# Patient Record
Sex: Female | Born: 1940
Health system: Southern US, Community
[De-identification: ages and names within clinical notes are randomized; demographics above are authoritative.]

## PROBLEM LIST (undated history)

## (undated) DIAGNOSIS — L659 Nonscarring hair loss, unspecified: Secondary | ICD-10-CM

## (undated) DIAGNOSIS — K900 Celiac disease: Secondary | ICD-10-CM

## (undated) DIAGNOSIS — K5792 Diverticulitis of intestine, part unspecified, without perforation or abscess without bleeding: Secondary | ICD-10-CM

## (undated) DIAGNOSIS — M549 Dorsalgia, unspecified: Secondary | ICD-10-CM

## (undated) DIAGNOSIS — E0590 Thyrotoxicosis, unspecified without thyrotoxic crisis or storm: Secondary | ICD-10-CM

## (undated) DIAGNOSIS — M1990 Unspecified osteoarthritis, unspecified site: Secondary | ICD-10-CM

## (undated) DIAGNOSIS — E079 Disorder of thyroid, unspecified: Secondary | ICD-10-CM

## (undated) DIAGNOSIS — I10 Essential (primary) hypertension: Secondary | ICD-10-CM

## (undated) DIAGNOSIS — K219 Gastro-esophageal reflux disease without esophagitis: Secondary | ICD-10-CM

## (undated) DIAGNOSIS — R0602 Shortness of breath: Secondary | ICD-10-CM

## (undated) DIAGNOSIS — F1010 Alcohol abuse, uncomplicated: Secondary | ICD-10-CM

## (undated) DIAGNOSIS — T7840XA Allergy, unspecified, initial encounter: Secondary | ICD-10-CM

## (undated) DIAGNOSIS — R600 Localized edema: Secondary | ICD-10-CM

## (undated) DIAGNOSIS — K589 Irritable bowel syndrome without diarrhea: Secondary | ICD-10-CM

## (undated) DIAGNOSIS — M2550 Pain in unspecified joint: Secondary | ICD-10-CM

## (undated) HISTORY — DX: Gastro-esophageal reflux disease without esophagitis: K21.9

## (undated) HISTORY — DX: Localized edema: R60.0

## (undated) HISTORY — DX: Alcohol abuse, uncomplicated: F10.10

## (undated) HISTORY — DX: Nonscarring hair loss, unspecified: L65.9

## (undated) HISTORY — DX: Thyrotoxicosis, unspecified without thyrotoxic crisis or storm: E05.90

## (undated) HISTORY — DX: Essential (primary) hypertension: I10

## (undated) HISTORY — PX: COLONOSCOPY: SHX174

## (undated) HISTORY — DX: Celiac disease: K90.0

## (undated) HISTORY — DX: Allergy, unspecified, initial encounter: T78.40XA

## (undated) HISTORY — PX: CERVICAL CONE BIOPSY: SUR198

## (undated) HISTORY — DX: Unspecified osteoarthritis, unspecified site: M19.90

## (undated) HISTORY — DX: Diverticulitis of intestine, part unspecified, without perforation or abscess without bleeding: K57.92

## (undated) HISTORY — DX: Irritable bowel syndrome without diarrhea: K58.9

## (undated) HISTORY — DX: Disorder of thyroid, unspecified: E07.9

## (undated) HISTORY — PX: ESOPHAGOGASTRODUODENOSCOPY: SHX1529

---
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
## 2001-04-18 HISTORY — PX: REPLACEMENT TOTAL KNEE BILATERAL: SUR1225

## 2017-12-15 ENCOUNTER — Telehealth: Payer: Self-pay

## 2017-12-15 NOTE — Telephone Encounter (Signed)
Copied from [place] [id number]. Topic: Appointment Scheduling - New Patient >> Dec 15, 2017 11:26 AM Judyann Munson wrote: New patient has been scheduled for your office. Provider: Dr.Wendling Date of Appointment: 01-24-18  Route to department's [facility] pool.

## 2018-01-24 ENCOUNTER — Ambulatory Visit (INDEPENDENT_AMBULATORY_CARE_PROVIDER_SITE_OTHER): Payer: PPO | Admitting: Family Medicine

## 2018-01-24 ENCOUNTER — Encounter: Payer: Self-pay | Admitting: Family Medicine

## 2018-01-24 VITALS — BP 122/82 | HR 90 | Temp 97.7°F | Ht 64.0 in | Wt 184.4 lb

## 2018-01-24 DIAGNOSIS — I10 Essential (primary) hypertension: Secondary | ICD-10-CM | POA: Insufficient documentation

## 2018-01-24 DIAGNOSIS — E039 Hypothyroidism, unspecified: Secondary | ICD-10-CM

## 2018-01-24 DIAGNOSIS — Z23 Encounter for immunization: Secondary | ICD-10-CM

## 2018-01-24 HISTORY — DX: Hypothyroidism, unspecified: E03.9

## 2018-01-24 HISTORY — DX: Essential (primary) hypertension: I10

## 2018-01-24 MED ORDER — ESTRADIOL 0.1 MG/GM VA CREA: 1.0000 | TOPICAL_CREAM | Freq: Every day | VAGINAL | 1 refills | Status: DC

## 2018-01-24 MED ORDER — IPRATROPIUM BROMIDE 0.06 % NA SOLN: 2.0000 | NASAL | 3 refills | Status: DC | PRN

## 2018-01-24 NOTE — Progress Notes (Signed)
Chief Complaint  Patient presents with  . New Patient (Initial Visit)       New Patient Visit SUBJECTIVE: HPI: Shannon Bond is an 77 y.o.female who is being seen for establishing care.  The patient was previously seen in [place].  Hypertension Patient presents for hypertension follow up. She does monitor home blood pressures. Blood pressures ranging on average from 110's/70's. She is compliant with medication lisinopril 30 mg/d. Patient has these side effects of medication: none She is adhering to a healthy diet overall. Exercise: goes to [employer], working with trainer, yoga  Hypothyroidism Patient presents for follow-up of hypothyroidism.  Reports compliance with medication. Current symptoms include: denies fatigue, weight changes, heat/cold intolerance, bowel/skin changes or CVS symptoms She believes her dose should be unchanged    Allergies  Allergen Reactions  . Latex   . Penicillins     Past Medical History:  Diagnosis Date  . Alcohol abuse   . Allergy   . Arthritis   . GERD (gastroesophageal reflux disease)   . Hypertension   . Thyroid disease    Past Surgical History:  Procedure Laterality Date  . APPENDECTOMY  1954  . [place]  . REPLACEMENT TOTAL KNEE BILATERAL  2003   Family History  Problem Relation Age of Onset  . Hypertension Mother   . Early death Mother        died at 1 in a fire  . Alcohol abuse Father   . Cancer Father   . Early death Father        died at 35  . Diabetes Son   . Cancer Maternal Grandmother        breast cancer  . Mental illness Maternal Grandfather    Allergies  Allergen Reactions  . Latex   . Penicillins     Current Outpatient Medications:  .  estradiol (ESTRACE) 0.1 MG/GM vaginal cream, Place 1 Applicatorful vaginally at bedtime., Disp: 42.5 g, Rfl: 1 .  ipratropium (ATROVENT) 0.06 % nasal spray, Place 2 sprays into both nostrils as needed for rhinitis., Disp: 45 mL,  Rfl: 3 .  levothyroxine (SYNTHROID, LEVOTHROID) 75 MCG tablet, Take 75 mcg by mouth daily before breakfast., Disp: , Rfl:  .  lisinopril (PRINIVIL,ZESTRIL) 30 MG tablet, Take 30 mg by mouth daily., Disp: , Rfl:  .  loperamide (IMODIUM) 2 MG capsule, Take by mouth as needed for diarrhea or loose stools., Disp: , Rfl:   ROS Cardiovascular: Denies chest pain  Respiratory: Denies dyspnea   OBJECTIVE: BP 122/82 (BP Location: Left Arm, Patient Position: Sitting, Cuff Size: Normal)   Pulse 90   Temp 97.7 F (36.5 C) (Oral)   Ht 5' 4"  (1.626 m)   Wt 184 lb 6 oz (83.6 kg)   SpO2 97%   BMI 31.65 kg/m   Constitutional: -  VS reviewed -  Well developed, well nourished, appears stated age -  No apparent distress  Psychiatric: -  Oriented to person, place, and time -  Memory intact -  Affect and mood normal -  Fluent conversation, good eye contact -  Judgment and insight age appropriate  Eye: -  Conjunctivae clear, no discharge -  Pupils symmetric, round, reactive to light  ENMT: -  MMM    Pharynx moist, no exudate, no erythema  Neck: -  No gross swelling, no palpable masses -  Thyroid midline, not enlarged, mobile, no palpable masses  Cardiovascular: -  RRR -  No  bruits -  No LE edema  Respiratory: -  Normal respiratory effort, no accessory muscle use, no retraction -  Breath sounds equal, no wheezes, no ronchi, no crackles  Musculoskeletal: -  No clubbing, no cyanosis -  Gait normal  Skin: -  No significant lesion on inspection -  Warm and dry to palpation   ASSESSMENT/PLAN: Essential hypertension  Hypothyroidism, unspecified type  Need for influenza vaccination - Plan: Flu vaccine HIGH DOSE PF (Fluzone High dose)  Patient instructed to sign release of records form from her previous PCP. Cont ACEi, OK to stop checking at home. Continue thyroid medication, will check at her physical in 6 months. Patient should return in 6 mo or prn. The patient voiced understanding and  agreement to the plan.   [place], DO 01/24/18  10:34 AM

## 2018-01-24 NOTE — Patient Instructions (Addendum)
Pepcid (famotidine) is a good alternative to Prevacid and/or Zantac.  Because your blood pressure is well-controlled, you no longer have to check your blood pressure at home anymore unless you wish. Some people check it twice daily every day and some people stop altogether. Either or anything in between is fine. Strong work!  Stay active.  Keep the diet clean.  Let us know if you need anything.

## 2018-01-24 NOTE — Progress Notes (Signed)
Pre visit review using our clinic review tool, if applicable. No additional management support is needed unless otherwise documented below in the visit note. 

## 2018-02-19 ENCOUNTER — Telehealth: Payer: Self-pay | Admitting: *Deleted

## 2018-02-19 NOTE — Telephone Encounter (Signed)
Received Medical records from [facility]; forwarded to provider/SLS 11/04

## 2018-02-21 ENCOUNTER — Telehealth: Payer: Self-pay | Admitting: Family Medicine

## 2018-02-21 NOTE — Telephone Encounter (Signed)
Imms updated in chart. Due for Shingrix, both series, DEXA, colonoscopy.

## 2018-03-06 ENCOUNTER — Other Ambulatory Visit: Payer: Self-pay | Admitting: Family Medicine

## 2018-03-06 NOTE — Telephone Encounter (Signed)
Copied from [place] [id number]. Topic: Quick Communication - Rx Refill/Question >> Mar 06, 2018  2:50 PM Margot Ables wrote: Medication: levothyroxine (SYNTHROID, LEVOTHROID) 75 MCG tablet - 90 day supply please - 1 wk on hand lisinopril (PRINIVIL,ZESTRIL) 30 MG tablet - 90 day supply please - 1 week on hand  Has the patient contacted their pharmacy? No - new to practice Preferred Pharmacy (with phone number or street name): [id number] [place], [address] [phone](Phone) [phone](Fax)

## 2018-03-07 NOTE — Telephone Encounter (Signed)
Patient requesting 90 supply if possible.

## 2018-03-08 MED ORDER — LISINOPRIL 30 MG PO TABS: 30.0000 mg | ORAL_TABLET | Freq: Every day | ORAL | 3 refills | Status: DC

## 2018-03-08 MED ORDER — LEVOTHYROXINE SODIUM 75 MCG PO TABS: 75.0000 ug | ORAL_TABLET | Freq: Every day | ORAL | 3 refills | Status: DC

## 2018-03-08 NOTE — Telephone Encounter (Signed)
Requested medication (s) are due for refill today: yes  Requested medication (s) are on the active medication list: yes    Last refill: 01/24/18  Future visit scheduled yes 07/25/2018  Notes to clinic:historical provider  Requested Prescriptions  Pending Prescriptions Disp Refills   lisinopril (PRINIVIL,ZESTRIL) 30 MG tablet      Sig: Take 1 tablet (30 mg total) by mouth daily.     Cardiovascular:  ACE Inhibitors Failed - 03/07/2018  3:55 PM      Failed - Cr in normal range and within 180 days    No results found for: CREATININE       Failed - K in normal range and within 180 days    No results found for: K, POTASSIUM       Passed - Patient is not pregnant      Passed - Last BP in normal range    BP Readings from Last 1 Encounters:  01/24/18 122/82         Passed - Valid encounter within last 6 months    Recent Outpatient Visits          1 month ago Essential hypertension   [facility]ivist at [facility], [place], [place]      Future Appointments            In 4 months [place], [place], [place] at [facility], [facility]          levothyroxine (SYNTHROID, LEVOTHROID) 75 MCG tablet      Sig: Take 1 tablet (75 mcg total) by mouth daily before breakfast.     Endocrinology:  Hypothyroid Agents Failed - 03/07/2018  3:55 PM      Failed - TSH needs to be rechecked within 3 months after an abnormal result. Refill until TSH is due.      Failed - TSH in normal range and within 360 days    No results found for: TSH       Passed - Valid encounter within last 12 months    Recent Outpatient Visits          1 month ago Essential hypertension   Archivist at [facility], [place], [place]      Future Appointments            In 4 months [place], [place], [place] at [facility], [facility]

## 2018-04-02 ENCOUNTER — Encounter: Payer: Self-pay | Admitting: Family Medicine

## 2018-04-03 ENCOUNTER — Other Ambulatory Visit: Payer: Self-pay | Admitting: Family Medicine

## 2018-04-03 ENCOUNTER — Ambulatory Visit (INDEPENDENT_AMBULATORY_CARE_PROVIDER_SITE_OTHER): Payer: PPO | Admitting: Family Medicine

## 2018-04-03 ENCOUNTER — Encounter: Payer: Self-pay | Admitting: Family Medicine

## 2018-04-03 VITALS — BP 120/70 | HR 96 | Temp 97.5°F | Ht 64.0 in | Wt 179.1 lb

## 2018-04-03 DIAGNOSIS — R42 Dizziness and giddiness: Secondary | ICD-10-CM | POA: Diagnosis not present

## 2018-04-03 DIAGNOSIS — E875 Hyperkalemia: Secondary | ICD-10-CM

## 2018-04-03 LAB — COMPREHENSIVE METABOLIC PANEL
ALT: 17 U/L (ref 0–35)
AST: 21 U/L (ref 0–37)
Albumin: 4.1 g/dL (ref 3.5–5.2)
Alkaline Phosphatase: 72 U/L (ref 39–117)
BUN: 25 mg/dL — ABNORMAL HIGH (ref 6–23)
CO2: 24 mEq/L (ref 19–32)
Calcium: 9.6 mg/dL (ref 8.4–10.5)
Chloride: 105 mEq/L (ref 96–112)
Creatinine, Ser: 0.95 mg/dL (ref 0.40–1.20)
GFR: 60.69 mL/min (ref >60.00)
Glucose, Bld: 94 mg/dL (ref 70–99)
Potassium: 5.2 mEq/L — ABNORMAL HIGH (ref 3.5–5.1)
Sodium: 138 mEq/L (ref 135–145)
Total Bilirubin: 0.5 mg/dL (ref 0.2–1.2)
Total Protein: 6.2 g/dL (ref 6.0–8.3)

## 2018-04-03 LAB — CBC
HCT: 39.8 % (ref 36.0–46.0)
Hemoglobin: 13.4 g/dL (ref 12.0–15.0)
MCHC: 33.8 g/dL (ref 30.0–36.0)
MCV: 94.9 fl (ref 78.0–100.0)
Platelets: 340 10*3/uL (ref 150.0–400.0)
RBC: 4.19 Mil/uL (ref 3.87–5.11)
RDW: 13.7 % (ref 11.5–15.5)
WBC: 7 10*3/uL (ref 4.0–10.5)

## 2018-04-03 LAB — TSH: TSH: 1.91 u[IU]/mL (ref 0.35–4.50)

## 2018-04-03 LAB — T4, FREE: Free T4: 0.95 ng/dL (ref 0.60–1.60)

## 2018-04-03 NOTE — Patient Instructions (Addendum)
Start checking blood pressure when you feel lightheaded. I think we are safe to continue your current blood pressure medication.  Stay hydrated. Consider salt tabs or pickle juice.   When you get up, get up slowly.   Let us know if you need anything.

## 2018-04-03 NOTE — Progress Notes (Signed)
Chief Complaint  Patient presents with  . Hypertension    Shannon Bond is 77 y.o. pt here for dizziness.  Duration: 3 months Pass out? No Spinning? No Recent illness/fever? No Headache? No; is having pressure In head Neurologic signs? No Change in PO intake? No  Meclizine is helpful.  No palpitations.  ROS:  Neuro: As noted in HPI Eyes: No vision changes  Past Medical History:  Diagnosis Date  . Alcohol abuse   . Allergy   . Arthritis   . GERD (gastroesophageal reflux disease)   . Hypertension   . Thyroid disease    BP 140/82 (BP Location: Left Arm, Patient Position: Sitting, Cuff Size: Normal)   Pulse 96   Temp (!) 97.5 F (36.4 C) (Oral)   Ht 5' 4"  (1.626 m)   Wt 179 lb 2 oz (81.3 kg)   SpO2 95%   BMI 30.75 kg/m  General: Awake, alert, appears stated age Eyes: PERRLA, EOMi Heart: RRR, no murmurs, no carotid bruits Lungs: CTAB, no accessory muscle use MSK: 5/5 strength throughout, gait normal Neuro: No cerebellar signs, patellar reflex 0/4 b/l wo clonus, biceps reflex 1/4 b/l wo clonus; Dix-Hall-Pike negative b/l. Psych: Age appropriate judgment and insight, normal mood and affect  Lightheaded - Plan: TSH, CBC, Comprehensive metabolic panel, T4, free  Orders as above. Ck above. Increase fluid intake. Will start taking pickle juice (for electrolytes) during day to help retain volume. Get up slowly. If no improvement, I will see her in 4 weeks. I don't think this is BPPV. Will consider cards referral.  Pt voiced understanding and agreement to the plan.  [place], DO 04/03/18 10:28 AM

## 2018-04-03 NOTE — Progress Notes (Signed)
Pre visit review using our clinic review tool, if applicable. No additional management support is needed unless otherwise documented below in the visit note. 

## 2018-04-06 ENCOUNTER — Other Ambulatory Visit (INDEPENDENT_AMBULATORY_CARE_PROVIDER_SITE_OTHER): Payer: PPO

## 2018-04-06 DIAGNOSIS — E875 Hyperkalemia: Secondary | ICD-10-CM

## 2018-04-06 LAB — BASIC METABOLIC PANEL
BUN: 25 mg/dL — ABNORMAL HIGH (ref 6–23)
CO2: 26 mEq/L (ref 19–32)
Calcium: 9.6 mg/dL (ref 8.4–10.5)
Chloride: 110 mEq/L (ref 96–112)
Creatinine, Ser: 0.94 mg/dL (ref 0.40–1.20)
GFR: 61.43 mL/min (ref >60.00)
Glucose, Bld: 89 mg/dL (ref 70–99)
Potassium: 5.4 mEq/L — ABNORMAL HIGH (ref 3.5–5.1)
Sodium: 143 mEq/L (ref 135–145)

## 2018-04-09 ENCOUNTER — Other Ambulatory Visit: Payer: Self-pay | Admitting: Family Medicine

## 2018-04-09 DIAGNOSIS — E875 Hyperkalemia: Secondary | ICD-10-CM

## 2018-04-09 MED ORDER — AMLODIPINE BESYLATE 5 MG PO TABS: 5.0000 mg | ORAL_TABLET | Freq: Every day | ORAL | 0 refills | Status: DC

## 2018-04-09 MED ORDER — AMLODIPINE BESYLATE 5 MG PO TABS: 5.0000 mg | ORAL_TABLET | Freq: Every day | ORAL | 3 refills | Status: DC

## 2018-04-20 ENCOUNTER — Other Ambulatory Visit (INDEPENDENT_AMBULATORY_CARE_PROVIDER_SITE_OTHER): Payer: PPO

## 2018-04-20 ENCOUNTER — Telehealth: Payer: Self-pay | Admitting: Family Medicine

## 2018-04-20 DIAGNOSIS — E875 Hyperkalemia: Secondary | ICD-10-CM | POA: Diagnosis not present

## 2018-04-20 LAB — BASIC METABOLIC PANEL
BUN: 26 mg/dL — ABNORMAL HIGH (ref 6–23)
CO2: 28 mEq/L (ref 19–32)
Calcium: 9.7 mg/dL (ref 8.4–10.5)
Chloride: 107 mEq/L (ref 96–112)
Creatinine, Ser: 0.94 mg/dL (ref 0.40–1.20)
GFR: 61.42 mL/min (ref >60.00)
Glucose, Bld: 88 mg/dL (ref 70–99)
Potassium: 4.9 mEq/L (ref 3.5–5.1)
Sodium: 143 mEq/L (ref 135–145)

## 2018-04-20 NOTE — Telephone Encounter (Signed)
Shannon Bond returned call to office regarding AWV. Patient declined to schedule at this time.

## 2018-04-21 ENCOUNTER — Encounter: Payer: Self-pay | Admitting: Family Medicine

## 2018-04-23 ENCOUNTER — Other Ambulatory Visit: Payer: Self-pay | Admitting: Family Medicine

## 2018-04-23 MED ORDER — CHLORTHALIDONE 25 MG PO TABS: 12.5000 mg | ORAL_TABLET | Freq: Every day | ORAL | 2 refills | Status: DC

## 2018-04-26 DIAGNOSIS — H903 Sensorineural hearing loss, bilateral: Secondary | ICD-10-CM | POA: Diagnosis not present

## 2018-05-04 ENCOUNTER — Encounter: Payer: Self-pay | Admitting: Family Medicine

## 2018-05-04 ENCOUNTER — Ambulatory Visit (INDEPENDENT_AMBULATORY_CARE_PROVIDER_SITE_OTHER): Payer: PPO | Admitting: Family Medicine

## 2018-05-04 VITALS — BP 164/84 | HR 91 | Temp 97.4°F | Ht 64.0 in | Wt 174.4 lb

## 2018-05-04 DIAGNOSIS — I10 Essential (primary) hypertension: Secondary | ICD-10-CM | POA: Diagnosis not present

## 2018-05-04 LAB — BASIC METABOLIC PANEL
BUN: 28 mg/dL — ABNORMAL HIGH (ref 6–23)
CO2: 28 mEq/L (ref 19–32)
Calcium: 9.9 mg/dL (ref 8.4–10.5)
Chloride: 103 mEq/L (ref 96–112)
Creatinine, Ser: 1.04 mg/dL (ref 0.40–1.20)
GFR: 51.42 mL/min — ABNORMAL LOW (ref >60.00)
Glucose, Bld: 90 mg/dL (ref 70–99)
Potassium: 4.8 mEq/L (ref 3.5–5.1)
Sodium: 139 mEq/L (ref 135–145)

## 2018-05-04 MED ORDER — CHLORTHALIDONE 25 MG PO TABS: 25.0000 mg | ORAL_TABLET | Freq: Every day | ORAL | 2 refills | Status: DC

## 2018-05-04 NOTE — Progress Notes (Signed)
Chief Complaint  Patient presents with  . Follow-up    Subjective Shannon Bond is a 78 y.o. female who presents for hypertension follow up. She does not monitor home blood pressures. Blood pressures ranging from 170's/80's on average. She is compliant with medication- chlorthalidone 12.5 mg/d. Patient has these side effects of medication: none She is adhering to a healthy diet overall. Current exercise: cardio, lifting   Past Medical History:  Diagnosis Date  . Alcohol abuse   . Allergy   . Arthritis   . GERD (gastroesophageal reflux disease)   . Hypertension   . Thyroid disease     Review of Systems Cardiovascular: no chest pain Respiratory:  no shortness of breath  Exam BP (!) 164/84 (BP Location: Left Arm, Patient Position: Sitting, Cuff Size: Normal)   Pulse 91   Temp (!) 97.4 F (36.3 C) (Oral)   Ht 5' 4"  (1.626 m)   Wt 174 lb 6 oz (79.1 kg)   SpO2 98%   BMI 29.93 kg/m  General:  well developed, well nourished, in no apparent distress Heart: RRR, no bruits, no LE edema Lungs: clear to auscultation, no accessory muscle use Psych: well oriented with normal range of affect and appropriate judgment/insight  Essential hypertension - Plan: lisinopril (PRINIVIL,ZESTRIL) 30 MG tablet, chlorthalidone (HYGROTON) 25 MG tablet, Basic metabolic panel, Basic metabolic panel  Orders as above. Start back on ACEi that was stopped due to hyperK. Ck today and in 2 weeks. Counseled on diet and exercise. F/u in 6 weeks. The patient voiced understanding and agreement to the plan.  [place], DO 05/04/18  10:20 AM

## 2018-05-04 NOTE — Patient Instructions (Signed)
Go back on lisinopril, let's stay on the chlorthalidone as this can lower potassium.  1 spoonful of pickle juice before bed to help with cramps. Stay well hydrated.  Give [place] 2-3 business days to get the results of your labs back.   Keep the diet clean and stay active.  Let us know if you need anything.

## 2018-05-04 NOTE — Progress Notes (Signed)
Pre visit review using our clinic review tool, if applicable. No additional management support is needed unless otherwise documented below in the visit note. 

## 2018-05-08 ENCOUNTER — Encounter: Payer: Self-pay | Admitting: Family Medicine

## 2018-05-09 ENCOUNTER — Other Ambulatory Visit: Payer: Self-pay | Admitting: Family Medicine

## 2018-05-09 DIAGNOSIS — R142 Eructation: Secondary | ICD-10-CM

## 2018-05-09 DIAGNOSIS — R109 Unspecified abdominal pain: Secondary | ICD-10-CM

## 2018-05-14 ENCOUNTER — Encounter: Payer: Self-pay | Admitting: Gastroenterology

## 2018-05-14 ENCOUNTER — Ambulatory Visit: Payer: PPO | Admitting: Gastroenterology

## 2018-05-14 VITALS — BP 136/70 | HR 77 | Ht 64.0 in | Wt 177.5 lb

## 2018-05-14 DIAGNOSIS — R142 Eructation: Secondary | ICD-10-CM | POA: Diagnosis not present

## 2018-05-14 DIAGNOSIS — K580 Irritable bowel syndrome with diarrhea: Secondary | ICD-10-CM | POA: Diagnosis not present

## 2018-05-14 DIAGNOSIS — Z5320 Procedure and treatment not carried out because of patient's decision for unspecified reasons: Secondary | ICD-10-CM | POA: Diagnosis not present

## 2018-05-14 DIAGNOSIS — R1013 Epigastric pain: Secondary | ICD-10-CM | POA: Diagnosis not present

## 2018-05-14 NOTE — Progress Notes (Signed)
Chief Complaint: Abdominal cramping, belching, diarrhea  Referring Provider:     Shelda Pal MD  HPI:    Shannon Bond is a 78 y.o. female referred to the Gastroenterology Clinic for evaluation of abdominal cramping, belching, nonbloody diarrhea.  She recently moved from [place] in 11/2016.  She states she had multiple GI sxs for a few years, with exacerrbation Dec 2018 surrounding dietary indiscretion over holidays described as bloating, abdominal discomfort. Was Rx-ed Prevacid and sucralfate with improvement.  Stopped Prevacid in Oct 2019 on own, but with recurrence of sxs- abdominal cramping in the mid and upper abdomen, so she resumed therapy.  Separately, she has hx of IBS-D which is well controlled with intermittent Lomotil, particularly when traveling.  This is unrelated to her presenting abdominal pain.  Hx of EGD approx 2014 n/f gastritis along w/ prior reported hx of PUD in 1982. Colonoscopy completed 13 years ago and n/f diverticulosis and internal hemorrhoids, and she wishes to hold off on repeat colonoscopy at this date.  Evaluation to date notable for recent normal BMP (BUN 28), TSH, CBC, liver enzymes.  No recent abdominal imaging for review.  Past Medical History:  Diagnosis Date  . Alcohol abuse   . Allergy   . Arthritis   . GERD (gastroesophageal reflux disease)   . Hypertension   . Thyroid disease      Past Surgical History:  Procedure Laterality Date  . APPENDECTOMY  1954  . [place]  . COLONOSCOPY     2007 said 13 years ago. 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 ESOPHAGOGASTRODUODENOSCOPY    . REPLACEMENT TOTAL KNEE BILATERAL  2003   Family History  Problem Relation Age of Onset  . Hypertension Mother   . Early death Mother        died at 34 in a fire  . Alcohol abuse Father   . Cancer Father   . Early death Father        died at 61  . Diabetes Son   . Cancer Maternal  Grandmother        breast cancer  . Mental illness Maternal Grandfather    Social History   Tobacco Use  . Smoking status: Never Smoker  . Smokeless tobacco: Never Used  Substance Use Topics  . Alcohol use: Never    Frequency: Never    Comment: Patient is a alcoholic  . Drug use: Never   Current Outpatient Medications  Medication Sig Dispense Refill  . Carboxymethylcellulose Sodium (EYE DROPS OP) Apply 1 tablet to eye daily.    . chlorthalidone (HYGROTON) 25 MG tablet Take 1 tablet (25 mg total) by mouth daily. 90 tablet 2  . estradiol (ESTRACE) 0.1 MG/GM vaginal cream Place 1 Applicatorful vaginally at bedtime. 42.5 g 1  . fluticasone (FLONASE) 50 MCG/ACT nasal spray daily.    Marland Kitchen ipratropium (ATROVENT) 0.06 % nasal spray Place 2 sprays into both nostrils as needed for rhinitis. 45 mL 3  . levothyroxine (SYNTHROID, LEVOTHROID) 75 MCG tablet Take 1 tablet (75 mcg total) by mouth daily before breakfast. 30 tablet 3  . lisinopril (PRINIVIL,ZESTRIL) 30 MG tablet Take 1 tablet (30 mg total) by mouth daily. 90 tablet 1  . loperamide (IMODIUM) 2 MG capsule Take by mouth as needed for diarrhea or loose stools.    . phenazopyridine (PYRIDIUM) 100 MG tablet 1 tablet  as needed. Pt only use when having a UTI    . S-Adenosylmethionine (SAM-E PO) Take 800 mg by mouth daily. 453m in the morning, 2061mat lunch and 20040mn the evening    . sucralfate (CARAFATE) 1 g tablet 1 tablet as needed.     No current facility-administered medications for this visit.    Allergies  Allergen Reactions  . Latex   . Nsaids     Said it upset her stomach really bad   . Penicillins      Review of Systems: All systems reviewed and negative except where noted in HPI.     Physical Exam:    Wt Readings from Last 3 Encounters:  05/14/18 177 lb 8 oz (80.5 kg)  05/04/18 174 lb 6 oz (79.1 kg)  04/03/18 179 lb 2 oz (81.3 kg)    Ht 5' 4"  (1.626 m)   Wt 177 lb 8 oz (80.5 kg)   BMI 30.47 kg/m    Constitutional:  Pleasant, in no acute distress. Psychiatric: Normal mood and affect. Behavior is normal. EENT: Pupils normal.  Conjunctivae are normal. No scleral icterus. Neck supple. No cervical LAD. Cardiovascular: Normal rate, regular rhythm. No edema Pulmonary/chest: Effort normal and breath sounds normal. No wheezing, rales or rhonchi. Abdominal: Soft, nondistended, nontender. Bowel sounds active throughout. There are no masses palpable. No hepatomegaly. Neurological: Alert and oriented to person place and time. Skin: Skin is warm and dry. No rashes noted.   ASSESSMENT AND PLAN;   Shannon Bond a 77 60o. female presenting with:  1) Abdominal pain: MEG/mid abdominal pain for a little over a year which responded to trial of Prevacid, and recurrence when stopping the PPI.  She reports a prior history of gastritis and a remote history of PUD.  Given clinical presentation, response to therapy but recurrence when therapy stopped, will plan on proceeding with endoscopic evaluation as below:  - EGD with random and directed biopsies - If EGD unrevealing, can either replace on low-dose PPI versus trial course of FD guard, etc.  2) IBS D: Symptoms well controlled on current therapy.  No plan to change from current prn Lomotil and dietary modifications.  3) CRC screening: Has not had a colonoscopy in approximately 13 years.  Offered colonoscopy for routine CRC screening, which she politely declined at this time wanting evaluation of her more acute issue as above.  Can revisit at follow-up appointment.  The indications, risks, and benefits of EGD were explained to the patient in detail. Risks include but are not limited to bleeding, perforation, adverse reaction to medications, and cardiopulmonary compromise. Sequelae include but are not limited to the possibility of surgery, hositalization, and mortality. The patient verbalized understanding and wished to proceed. All questions answered,  referred to scheduler. Further recommendations pending results of the exam.   VitLavena BullionO, FACG  05/14/2018, 2:15 PM   [place], [place]

## 2018-05-14 NOTE — Patient Instructions (Signed)
If you are age 77 or older, your body mass index should be between 23-30. Your Body mass index is 30.47 kg/m. If this is out of the aforementioned range listed, please consider follow up with your Primary Care Provider.  If you are age 20 or younger, your body mass index should be between 19-25. Your Body mass index is 30.47 kg/m. If this is out of the aformentioned range listed, please consider follow up with your Primary Care Provider.   You have been scheduled for an endoscopy. Please follow written instructions given to you at your visit today. If you use inhalers (even only as needed), please bring them with you on the day of your procedure. Your physician has requested that you go to www.startemmi.com and enter the access code given to you at your visit today. This web site gives a general overview about your procedure. However, you should still follow specific instructions given to you by our office regarding your preparation for the procedure.  It was a pleasure to see you today!  Vito Cirigliano, D.O.

## 2018-05-16 ENCOUNTER — Ambulatory Visit: Payer: PPO | Admitting: Physician Assistant

## 2018-05-18 ENCOUNTER — Other Ambulatory Visit (INDEPENDENT_AMBULATORY_CARE_PROVIDER_SITE_OTHER): Payer: PPO

## 2018-05-18 ENCOUNTER — Ambulatory Visit (AMBULATORY_SURGERY_CENTER): Payer: PPO | Admitting: Gastroenterology

## 2018-05-18 ENCOUNTER — Encounter: Payer: Self-pay | Admitting: Gastroenterology

## 2018-05-18 VITALS — BP 139/67 | HR 68 | Temp 96.9°F | Resp 10 | Ht 64.0 in | Wt 177.0 lb

## 2018-05-18 DIAGNOSIS — R1013 Epigastric pain: Secondary | ICD-10-CM

## 2018-05-18 DIAGNOSIS — R109 Unspecified abdominal pain: Secondary | ICD-10-CM | POA: Diagnosis not present

## 2018-05-18 DIAGNOSIS — I10 Essential (primary) hypertension: Secondary | ICD-10-CM | POA: Diagnosis not present

## 2018-05-18 DIAGNOSIS — K2950 Unspecified chronic gastritis without bleeding: Secondary | ICD-10-CM | POA: Diagnosis not present

## 2018-05-18 DIAGNOSIS — K2970 Gastritis, unspecified, without bleeding: Secondary | ICD-10-CM

## 2018-05-18 DIAGNOSIS — R140 Abdominal distension (gaseous): Secondary | ICD-10-CM

## 2018-05-18 DIAGNOSIS — R197 Diarrhea, unspecified: Secondary | ICD-10-CM

## 2018-05-18 LAB — BASIC METABOLIC PANEL
BUN: 29 mg/dL — ABNORMAL HIGH (ref 6–23)
CO2: 27 mEq/L (ref 19–32)
Calcium: 9.6 mg/dL (ref 8.4–10.5)
Chloride: 101 mEq/L (ref 96–112)
Creatinine, Ser: 0.85 mg/dL (ref 0.40–1.20)
GFR: 64.9 mL/min (ref >60.00)
Glucose, Bld: 89 mg/dL (ref 70–99)
Potassium: 4.6 mEq/L (ref 3.5–5.1)
Sodium: 136 mEq/L (ref 135–145)

## 2018-05-18 MED ORDER — SODIUM CHLORIDE 0.9 % IV SOLN: 500.0000 mL | INTRAVENOUS | Status: DC

## 2018-05-18 NOTE — Progress Notes (Signed)
Called to room to assist during endoscopic procedure.  Patient ID and intended procedure confirmed with present staff. Received instructions for my participation in the procedure from the performing physician.  

## 2018-05-18 NOTE — Patient Instructions (Signed)
YOU HAD AN ENDOSCOPIC PROCEDURE TODAY AT [facility]:   Refer to the procedure report that was given to you for any specific questions about what was found during the examination.  If the procedure report does not answer your questions, please call your gastroenterologist to clarify.  If you requested that your care partner not be given the details of your procedure findings, then the procedure report has been included in a sealed envelope for you to review at your convenience later.  YOU SHOULD EXPECT: Some feelings of bloating in the abdomen. Passage of more gas than usual.  Walking can help get rid of the air that was put into your GI tract during the procedure and reduce the bloating.   Please Note:  You might notice some irritation and congestion in your nose or some drainage.  This is from the oxygen used during your procedure.  There is no need for concern and it should clear up in a day or so.  SYMPTOMS TO REPORT IMMEDIATELY:  Following upper endoscopy (EGD)  Vomiting of blood or coffee ground material  New chest pain or pain under the shoulder blades  Painful or persistently difficult swallowing  New shortness of breath  Fever of 100F or higher  Black, tarry-looking stools  For urgent or emergent issues, a gastroenterologist can be reached at any hour by calling [phone].   DIET:  We do recommend a small meal at first, but then you may proceed to your regular diet.  Drink plenty of fluids but you should avoid alcoholic beverages for 24 hours.  ACTIVITY:  You should plan to take it easy for the rest of today and you should NOT DRIVE or use heavy machinery until tomorrow (because of the sedation medicines used during the test).    FOLLOW UP: Our staff will call the number listed on your records the next business day following your procedure to check on you and address any questions or concerns that you may have regarding the information given to you following  your procedure. If we do not reach you, we will leave a message.  However, if you are feeling well and you are not experiencing any problems, there is no need to return our call.  We will assume that you have returned to your regular daily activities without incident.  If any biopsies were taken you will be contacted by phone or by letter within the next 1-3 weeks.  Please call us at [phone] if you have not heard about the biopsies in 3 weeks.    SIGNATURES/CONFIDENTIALITY: You and/or your care partner have signed paperwork which will be entered into your electronic medical record.  These signatures attest to the fact that that the information above on your After Visit Summary has been reviewed and is understood.  Full responsibility of the confidentiality of this discharge information lies with you and/or your care-partner.  Await pathology  Continue your normal medications   Return to Dr. Bryan Lemma as needed

## 2018-05-18 NOTE — Op Note (Signed)
[facility] Patient Name: Shannon Bond Procedure Date: 05/18/2018 1:14 PM MRN: [id number] Endoscopist: Gerrit Heck , MD Age: 78 Referring MD:  Date of Birth: Mar 13, 1941 Gender: Female Account #: [id number] Procedure:                Upper GI endoscopy Indications:              Epigastric abdominal pain, Exclusion of gastritis                            (prior history of gastritis), Abdominal bloating,                            Diarrhea Medicines:                Monitored Anesthesia Care Procedure:                Pre-Anesthesia Assessment:                           - Prior to the procedure, a History and Physical                            was performed, and patient medications and                            allergies were reviewed. The patient's tolerance of                            previous anesthesia was also reviewed. The risks                            and benefits of the procedure and the sedation                            options and risks were discussed with the patient.                            All questions were answered, and informed consent                            was obtained. Prior Anticoagulants: The patient has                            taken no previous anticoagulant or antiplatelet                            agents. ASA Grade Assessment: II - A patient with                            mild systemic disease. After reviewing the risks                            and benefits, the patient was deemed in  satisfactory condition to undergo the procedure.                           After obtaining informed consent, the endoscope was                            passed under direct vision. Throughout the                            procedure, the patient's blood pressure, pulse, and                            oxygen saturations were monitored continuously. The                            Endoscope was introduced through the mouth,  and                            advanced to the third part of duodenum. The upper                            GI endoscopy was accomplished without difficulty.                            The patient tolerated the procedure well. Scope In: Scope Out: Findings:                 The examined esophagus was normal.                           The Z-line was regular and was found 39 cm from the                            incisors.                           The gastroesophageal flap valve was visualized                            endoscopically and classified as Hill Grade II                            (fold present, opens with respiration).                           The entire examined stomach was normal. Biopsies                            were taken with a cold forceps for Helicobacter                            pylori testing. Estimated blood loss was minimal.                           The duodenal bulb, first portion of the  duodenum,                            second portion of the duodenum and third portion of                            the duodenum were normal. Biopsies for histology                            were taken with a cold forceps for evaluation of                            celiac disease. Estimated blood loss was minimal. Complications:            No immediate complications. Estimated Blood Loss:     Estimated blood loss was minimal. Impression:               - Normal esophagus.                           - Z-line regular, 39 cm from the incisors.                           - Gastroesophageal flap valve classified as Hill                            Grade II (fold present, opens with respiration).                           - Normal stomach. Biopsied.                           - Normal duodenal bulb, first portion of the                            duodenum, second portion of the duodenum and third                            portion of the duodenum. Biopsied. Recommendation:            - Patient has a contact number available for                            emergencies. The signs and symptoms of potential                            delayed complications were discussed with the                            patient. Return to normal activities tomorrow.                            Written discharge instructions were provided to the                            patient.                           -  Resume previous diet today.                           - Continue present medications.                           - Await pathology results.                           - Return to GI clinic PRN. Gerrit Heck, MD 05/18/2018 1:35:04 PM

## 2018-05-18 NOTE — Progress Notes (Signed)
A and O x3. Report to RN. Tolerated MAC anesthesia well.Teeth unchanged after procedure.

## 2018-05-21 ENCOUNTER — Telehealth: Payer: Self-pay | Admitting: *Deleted

## 2018-05-21 ENCOUNTER — Telehealth: Payer: Self-pay

## 2018-05-21 NOTE — Telephone Encounter (Signed)
  Follow up Call-  Call back number 05/18/2018  Post procedure Call Back phone  # [phone]  Permission to leave phone message Yes     Patient questions:  Do you have a fever, pain , or abdominal swelling? No. Pain Score  0 *  Have you tolerated food without any problems? Yes.    Have you been able to return to your normal activities? Yes.    Do you have any questions about your discharge instructions: Diet   No. Medications  No. Follow up visit  No.  Do you have questions or concerns about your Care? No.  Actions: * If pain score is 4 or above: No action needed, pain <4.

## 2018-05-21 NOTE — Telephone Encounter (Signed)
Left message on answering machine. 

## 2018-05-24 ENCOUNTER — Encounter: Payer: Self-pay | Admitting: Family Medicine

## 2018-05-24 ENCOUNTER — Other Ambulatory Visit: Payer: Self-pay | Admitting: Family Medicine

## 2018-05-24 MED ORDER — HYDROCHLOROTHIAZIDE 25 MG PO TABS: 25.0000 mg | ORAL_TABLET | Freq: Every day | ORAL | 3 refills | Status: DC

## 2018-05-25 ENCOUNTER — Other Ambulatory Visit: Payer: Self-pay | Admitting: Family Medicine

## 2018-05-28 ENCOUNTER — Encounter: Payer: Self-pay | Admitting: Gastroenterology

## 2018-06-15 ENCOUNTER — Ambulatory Visit (INDEPENDENT_AMBULATORY_CARE_PROVIDER_SITE_OTHER): Payer: PPO | Admitting: Family Medicine

## 2018-06-15 ENCOUNTER — Encounter: Payer: Self-pay | Admitting: Family Medicine

## 2018-06-15 ENCOUNTER — Other Ambulatory Visit: Payer: Self-pay | Admitting: Family Medicine

## 2018-06-15 VITALS — BP 128/78 | HR 75 | Temp 97.5°F | Ht 64.0 in | Wt 177.2 lb

## 2018-06-15 DIAGNOSIS — E875 Hyperkalemia: Secondary | ICD-10-CM | POA: Diagnosis not present

## 2018-06-15 DIAGNOSIS — I10 Essential (primary) hypertension: Secondary | ICD-10-CM

## 2018-06-15 LAB — BASIC METABOLIC PANEL
BUN: 31 mg/dL — ABNORMAL HIGH (ref 6–23)
CO2: 28 mEq/L (ref 19–32)
Calcium: 9.5 mg/dL (ref 8.4–10.5)
Chloride: 105 mEq/L (ref 96–112)
Creatinine, Ser: 1 mg/dL (ref 0.40–1.20)
GFR: 53.79 mL/min — ABNORMAL LOW (ref >60.00)
Glucose, Bld: 92 mg/dL (ref 70–99)
Potassium: 5.4 mEq/L — ABNORMAL HIGH (ref 3.5–5.1)
Sodium: 140 mEq/L (ref 135–145)

## 2018-06-15 NOTE — Progress Notes (Signed)
Chief Complaint  Patient presents with  . Follow-up    Subjective Shannon Bond is a 78 y.o. female who presents for hypertension follow up. She does monitor home blood pressures. Blood pressures ranging from 120-130's/70-80's on average. She is compliant with medications- lisinopril and chlorthalidone. We had stopped ACEi in past due to hyperkalemia. WNL at last visit.  Patient has these side effects of medication: none She is adhering to a healthy diet overall. Current exercise: yoga, strength training   Past Medical History:  Diagnosis Date  . Alcohol abuse   . Allergy   . Arthritis   . Diverticulitis   . GERD (gastroesophageal reflux disease)   . Hypertension   . IBS (irritable bowel syndrome)   . Thyroid disease     Review of Systems Cardiovascular: no chest pain Respiratory:  no shortness of breath  Exam BP 128/78 (BP Location: Left Arm, Patient Position: Sitting, Cuff Size: Normal)   Pulse 75   Temp (!) 97.5 F (36.4 C) (Oral)   Ht 5' 4"  (1.626 m)   Wt 177 lb 4 oz (80.4 kg)   SpO2 94%   BMI 30.42 kg/m  General:  well developed, well nourished, in no apparent distress Heart: RRR, no bruits, no LE edema Lungs: clear to auscultation, no accessory muscle use Psych: well oriented with normal range of affect and appropriate judgment/insight  Essential hypertension - Plan: Basic metabolic panel  Hyperkalemia - Plan: Basic metabolic panel  Orders as above. Counseled on diet and exercise. F/u as originally scheduled. The patient voiced understanding and agreement to the plan.  [place], DO 06/15/18  10:14 AM

## 2018-06-15 NOTE — Patient Instructions (Addendum)
Keep the diet clean and stay active.  Goal weight: 3 lb weight loss (174 lbs).  Give [place] 2-3 business days to get the results of your labs back.   Let us know if you need anything.

## 2018-06-18 ENCOUNTER — Other Ambulatory Visit (INDEPENDENT_AMBULATORY_CARE_PROVIDER_SITE_OTHER): Payer: PPO

## 2018-06-18 DIAGNOSIS — E875 Hyperkalemia: Secondary | ICD-10-CM

## 2018-06-18 LAB — BASIC METABOLIC PANEL
BUN: 37 mg/dL — ABNORMAL HIGH (ref 6–23)
CO2: 27 mEq/L (ref 19–32)
Calcium: 9.3 mg/dL (ref 8.4–10.5)
Chloride: 102 mEq/L (ref 96–112)
Creatinine, Ser: 1.1 mg/dL (ref 0.40–1.20)
GFR: 48.18 mL/min — ABNORMAL LOW (ref >60.00)
Glucose, Bld: 97 mg/dL (ref 70–99)
Potassium: 4.4 mEq/L (ref 3.5–5.1)
Sodium: 137 mEq/L (ref 135–145)

## 2018-07-25 ENCOUNTER — Encounter: Payer: PPO | Admitting: Family Medicine

## 2018-08-15 ENCOUNTER — Encounter: Payer: Self-pay | Admitting: Family Medicine

## 2018-08-15 DIAGNOSIS — I10 Essential (primary) hypertension: Secondary | ICD-10-CM

## 2018-08-15 MED ORDER — LISINOPRIL 30 MG PO TABS: 30.0000 mg | ORAL_TABLET | Freq: Every day | ORAL | 1 refills | Status: DC

## 2018-09-15 ENCOUNTER — Other Ambulatory Visit: Payer: Self-pay | Admitting: Family Medicine

## 2018-09-28 ENCOUNTER — Other Ambulatory Visit: Payer: Self-pay

## 2018-09-28 ENCOUNTER — Ambulatory Visit (INDEPENDENT_AMBULATORY_CARE_PROVIDER_SITE_OTHER): Payer: PPO | Admitting: Family Medicine

## 2018-09-28 ENCOUNTER — Encounter: Payer: Self-pay | Admitting: Family Medicine

## 2018-09-28 VITALS — BP 118/72 | HR 93 | Temp 97.4°F | Ht 64.0 in | Wt 180.0 lb

## 2018-09-28 DIAGNOSIS — Z85828 Personal history of other malignant neoplasm of skin: Secondary | ICD-10-CM | POA: Diagnosis not present

## 2018-09-28 DIAGNOSIS — E2839 Other primary ovarian failure: Secondary | ICD-10-CM | POA: Diagnosis not present

## 2018-09-28 DIAGNOSIS — Z0000 Encounter for general adult medical examination without abnormal findings: Secondary | ICD-10-CM | POA: Diagnosis not present

## 2018-09-28 LAB — LIPID PANEL
Cholesterol: 187 mg/dL (ref 0–200)
HDL: 57.5 mg/dL (ref >39.00)
LDL Cholesterol: 94 mg/dL (ref 0–99)
NonHDL: 129.67
Total CHOL/HDL Ratio: 3
Triglycerides: 179 mg/dL — ABNORMAL HIGH (ref 0.0–149.0)
VLDL: 35.8 mg/dL (ref 0.0–40.0)

## 2018-09-28 LAB — COMPREHENSIVE METABOLIC PANEL
ALT: 16 U/L (ref 0–35)
AST: 22 U/L (ref 0–37)
Albumin: 4.1 g/dL (ref 3.5–5.2)
Alkaline Phosphatase: 76 U/L (ref 39–117)
BUN: 34 mg/dL — ABNORMAL HIGH (ref 6–23)
CO2: 27 mEq/L (ref 19–32)
Calcium: 9.6 mg/dL (ref 8.4–10.5)
Chloride: 103 mEq/L (ref 96–112)
Creatinine, Ser: 1.16 mg/dL (ref 0.40–1.20)
GFR: 45.29 mL/min — ABNORMAL LOW (ref >60.00)
Glucose, Bld: 91 mg/dL (ref 70–99)
Potassium: 4.5 mEq/L (ref 3.5–5.1)
Sodium: 139 mEq/L (ref 135–145)
Total Bilirubin: 0.6 mg/dL (ref 0.2–1.2)
Total Protein: 6.4 g/dL (ref 6.0–8.3)

## 2018-09-28 NOTE — Progress Notes (Signed)
CC: Well visit   Well Woman Shannon Bond is here for a complete physical.   Her last physical was >1 year ago.  Current diet: in general, a "healthy" diet. Current exercise: yoga, walking, wt resistance. Weight is stable overall and she denies daytime fatigue. Seatbelt? Yes  Health Maintenance DEXA- No Tetanus- Yes Pneumonia- Yes   Past Medical History:  Diagnosis Date  . Alcohol abuse   . Allergy   . Arthritis   . Diverticulitis   . GERD (gastroesophageal reflux disease)   . Hypertension   . IBS (irritable bowel syndrome)   . Thyroid disease      Past Surgical History:  Procedure Laterality Date  . APPENDECTOMY  1954  . [place]  . COLONOSCOPY     2007 said 13 years ago. 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 ESOPHAGOGASTRODUODENOSCOPY     2014  . REPLACEMENT TOTAL KNEE BILATERAL  2003    Medications  Current Outpatient Medications on File Prior to Visit  Medication Sig Dispense Refill  . AMBULATORY NON FORMULARY MEDICATION Aloe juice with D-ribose    . b complex vitamins tablet Take 1 tablet by mouth daily.    . Bismuth Subsalicylate (PEPTO-BISMOL PO) Take 1 tablet by mouth as needed.    . Calcium Carb-Cholecalciferol (CALCIUM/VITAMIN D PO) Take 1 tablet by mouth at bedtime.    . Calcium Glycerophosphate (PRELIEF PO) Take 1 tablet by mouth 2 (two) times daily.    Marland Kitchen estradiol (ESTRACE) 0.1 MG/GM vaginal cream Place 1 Applicatorful vaginally at bedtime. 42.5 g 1  . fluticasone (FLONASE) 50 MCG/ACT nasal spray daily.    . hydrochlorothiazide (HYDRODIURIL) 25 MG tablet TAKE 1 TABLET BY MOUTH EVERY DAY 90 tablet 1  . ipratropium (ATROVENT) 0.06 % nasal spray Place 2 sprays into both nostrils as needed for rhinitis. 45 mL 3  . lactobacillus acidophilus (BACID) TABS tablet Take 2 tablets by mouth daily.    Marland Kitchen levothyroxine (SYNTHROID, LEVOTHROID) 75 MCG tablet TAKE 1 TABLET (75 MCG TOTAL) BY MOUTH DAILY BEFORE BREAKFAST. 90  tablet 1  . lisinopril (ZESTRIL) 30 MG tablet Take 1 tablet (30 mg total) by mouth daily. 90 tablet 1  . loperamide (IMODIUM) 2 MG capsule Take by mouth as needed for diarrhea or loose stools.    . Magnesium 250 MG TABS Take 1 tablet by mouth at bedtime.    . MECLIZINE HCL PO Take 1 tablet by mouth daily.    . Multiple Vitamins-Minerals (MULTIVITAMIN ADULT PO) Take 1 tablet by mouth daily.    . Omega-3 Fatty Acids (FISH OIL PO) Take 1 tablet by mouth daily.    Marland Kitchen PAPAYA PO Take 1 tablet by mouth as needed.    . phenazopyridine (PYRIDIUM) 100 MG tablet 1 tablet as needed. Pt only use when having a UTI    . S-Adenosylmethionine (SAM-E PO) Take 800 mg by mouth daily. 487m in the morning, 2025mat lunch and 20032mn the evening    . VITAMIN D, CHOLECALCIFEROL, PO Take 1 tablet by mouth daily.      Allergies Allergies  Allergen Reactions  . Chlorine   . Latex   . Nsaids     Said it upset her stomach really bad   . Penicillins     Review of Systems: Constitutional:  no sweats Eye:  no recent significant change in vision Ear/Nose/Mouth/Throat:  Ears:  No changes in hearing Nose/Mouth/Throat:  no complaints  of nasal congestion, no sore throat Cardiovascular: no chest pain Respiratory:  No cough and no shortness of breath Gastrointestinal:  no abdominal pain, no change in bowel habits GU:  Female: negative for dysuria or pelvic pain Musculoskeletal/Extremities: +b/l shoulder pain; otherwise no pain of the joints Integumentary (Skin/Breast): +lesions on legs; otherwise no abnormal skin lesions reported Neurologic:  no headaches Psychiatric: +bereavement Endocrine:  denies unexplained weight changes Hematologic/Lymphatic:  no abnormal bleeding  Exam BP 118/72 (BP Location: Left Arm, Patient Position: Sitting, Cuff Size: Normal)   Pulse 93   Temp (!) 97.4 F (36.3 C) (Oral)   Ht 5' 4"  (1.626 m)   Wt 180 lb (81.6 kg)   SpO2 93%   BMI 30.90 kg/m  General:  well developed, well  nourished, in no apparent distress Skin:  no significant moles, warts, or growths Head:  no masses, lesions, or tenderness Eyes:  pupils equal and round, sclera anicteric without injection Ears:  canals without lesions, TMs shiny without retraction, no obvious effusion, no erythema Nose:  nares patent, septum midline, mucosa normal, and no drainage or sinus tenderness Throat/Pharynx:  lips and gingiva without lesion; tongue and uvula midline; non-inflamed pharynx; no exudates or postnasal drainage Neck: neck supple without adenopathy, thyromegaly, or masses Lungs:  clear to auscultation, breath sounds equal bilaterally, no respiratory distress Cardio:  regular rate and rhythm, no bruits or LE edema Abdomen:  abdomen soft, nontender; bowel sounds normal; no masses or organomegaly Genital: Deferred Musculoskeletal: +Neer's, Hawkins, empty can b/l; otherwise symmetrical muscle groups noted without atrophy or deformity Extremities:  no clubbing, cyanosis, or edema, no deformities, no skin discoloration Neuro:  gait normal; deep tendon reflexes normal and symmetric Psych: well oriented with normal range of affect and appropriate judgment/insight  Assessment and Plan  Well adult exam - Plan: Comprehensive metabolic panel, Lipid panel, counseled on diet and exercise.  Estrogen deficiency - Plan: DG Bone Density  History of skin cancer - Plan: Ambulatory referral to Dermatology   Well 78 y.o. female. Other orders as above. Refer to derm. Hx of BCC and SCC.  DEXA. [facility] # given. Stretches/exercises given for shoulder pain. Likely strain of supraspinatus.  Follow up in 6 mo pending the above workup. The patient voiced understanding and agreement to the plan.  [place], DO 09/28/18 9:48 AM

## 2018-09-28 NOTE — Patient Instructions (Addendum)
If you do not hear anything about your referral in the next 1-2 weeks, call our office and ask for an update.  Give [place] 2-3 business days to get the results of your labs back.   Keep the diet clean and stay active.  Please consider counseling. Contact [phone] to schedule an appointment or inquire about cost/insurance coverage.  Let us know if you need anything.  EXERCISES  RANGE OF MOTION (ROM) AND STRETCHING EXERCISES These exercises may help you when beginning to rehabilitate your injury. While completing these exercises, remember:   Restoring tissue flexibility helps normal motion to return to the joints. This allows healthier, less painful movement and activity.  An effective stretch should be held for at least 30 seconds.  A stretch should never be painful. You should only feel a gentle lengthening or release in the stretched tissue.  ROM - Pendulum  Bend at the waist so that your right / left arm falls away from your body. Support yourself with your opposite hand on a solid surface, such as a table or a countertop.  Your right / left arm should be perpendicular to the ground. If it is not perpendicular, you need to lean over farther. Relax the muscles in your right / left arm and shoulder as much as possible.  Gently sway your hips and trunk so they move your right / left arm without any use of your right / left shoulder muscles.  Progress your movements so that your right / left arm moves side to side, then forward and backward, and finally, both clockwise and counterclockwise.  Complete 10-15 repetitions in each direction. Many people use this exercise to relieve discomfort in their shoulder as well as to gain range of motion. Repeat 2 times. Complete this exercise 3 times per week.  STRETCH - Flexion, Standing  Stand with good posture. With an underhand grip on your right / left hand and an overhand grip on the opposite hand, grasp a broomstick or cane so that your  hands are a little more than shoulder-width apart.  Keeping your right / left elbow straight and shoulder muscles relaxed, push the stick with your opposite hand to raise your right / left arm in front of your body and then overhead. Raise your arm until you feel a stretch in your right / left shoulder, but before you have increased shoulder pain.  Try to avoid shrugging your right / left shoulder as your arm rises by keeping your shoulder blade tucked down and toward your mid-back spine. Hold 30 seconds.  Slowly return to the starting position. Repeat 2 times. Complete this exercise 3 times per week.  STRETCH - Internal Rotation  Place your right / left hand behind your back, palm-up.  Throw a towel or belt over your opposite shoulder. Grasp the towel/belt with your right / left hand.  While keeping an upright posture, gently pull up on the towel/belt until you feel a stretch in the front of your right / left shoulder.  Avoid shrugging your right / left shoulder as your arm rises by keeping your shoulder blade tucked down and toward your mid-back spine.  Hold 30. Release the stretch by lowering your opposite hand. Repeat 2 times. Complete this exercise 3 times per week.  STRETCH - External Rotation and Abduction  Stagger your stance through a doorframe. It does not matter which foot is forward.  As instructed by your physician, physical therapist or athletic trainer, place your hands: ? And forearms  above your head and on the door frame. ? And forearms at head-height and on the door frame. ? At elbow-height and on the door frame.  Keeping your head and chest upright and your stomach muscles tight to prevent over-extending your low-back, slowly shift your weight onto your front foot until you feel a stretch across your chest and/or in the front of your shoulders.  Hold 30 seconds. Shift your weight to your back foot to release the stretch. Repeat 2 times. Complete this stretch 3  times per week.   STRENGTHENING EXERCISES  These exercises may help you when beginning to rehabilitate your injury. They may resolve your symptoms with or without further involvement from your physician, physical therapist or athletic trainer. While completing these exercises, remember:   Muscles can gain both the endurance and the strength needed for everyday activities through controlled exercises.  Complete these exercises as instructed by your physician, physical therapist or athletic trainer. Progress the resistance and repetitions only as guided.  You may experience muscle soreness or fatigue, but the pain or discomfort you are trying to eliminate should never worsen during these exercises. If this pain does worsen, stop and make certain you are following the directions exactly. If the pain is still present after adjustments, discontinue the exercise until you can discuss the trouble with your clinician.  If advised by your physician, during your recovery, avoid activity or exercises which involve actions that place your right / left hand or elbow above your head or behind your back or head. These positions stress the tissues which are trying to heal.  STRENGTH - Scapular Depression and Adduction  With good posture, sit on a firm chair. Supported your arms in front of you with pillows, arm rests or a table top. Have your elbows in line with the sides of your body.  Gently draw your shoulder blades down and toward your mid-back spine. Gradually increase the tension without tensing the muscles along the top of your shoulders and the back of your neck.  Hold for 3 seconds. Slowly release the tension and relax your muscles completely before completing the next repetition.  After you have practiced this exercise, remove the arm support and complete it in standing as well as sitting. Repeat 2 times. Complete this exercise 3 times per week.   STRENGTH - External Rotators  Secure a rubber  exercise band/tubing to a fixed object so that it is at the same height as your right / left elbow when you are standing or sitting on a firm surface.  Stand or sit so that the secured exercise band/tubing is at your side that is not injured.  Bend your elbow 90 degrees. Place a folded towel or small pillow under your right / left arm so that your elbow is a few inches away from your side.  Keeping the tension on the exercise band/tubing, pull it away from your body, as if pivoting on your elbow. Be sure to keep your body steady so that the movement is only coming from your shoulder rotating.  Hold 3 seconds. Release the tension in a controlled manner as you return to the starting position. Repeat 2 times. Complete this exercise 3 times per week.   STRENGTH - Supraspinatus  Stand or sit with good posture. Grasp a 2-3 lb weight or an exercise band/tubing so that your hand is "thumbs-up," like when you shake hands.  Slowly lift your right / left hand from your thigh into the air, traveling  about 30 degrees from straight out at your side. Lift your hand to shoulder height or as far as you can without increasing any shoulder pain. Initially, many people do not lift their hands above shoulder height.  Avoid shrugging your right / left shoulder as your arm rises by keeping your shoulder blade tucked down and toward your mid-back spine.  Hold for 3 seconds. Control the descent of your hand as you slowly return to your starting position. Repeat 2 times. Complete this exercise 3 times per week.   STRENGTH - Shoulder Extensors  Secure a rubber exercise band/tubing so that it is at the height of your shoulders when you are either standing or sitting on a firm arm-less chair.  With a thumbs-up grip, grasp an end of the band/tubing in each hand. Straighten your elbows and lift your hands straight in front of you at shoulder height. Step back away from the secured end of band/tubing until it becomes  tense.  Squeezing your shoulder blades together, pull your hands down to the sides of your thighs. Do not allow your hands to go behind you.  Hold for 3 seconds. Slowly ease the tension on the band/tubing as you reverse the directions and return to the starting position. Repeat 2 times. Complete this exercise 3 times per week.   STRENGTH - Scapular Retractors  Secure a rubber exercise band/tubing so that it is at the height of your shoulders when you are either standing or sitting on a firm arm-less chair.  With a palm-down grip, grasp an end of the band/tubing in each hand. Straighten your elbows and lift your hands straight in front of you at shoulder height. Step back away from the secured end of band/tubing until it becomes tense.  Squeezing your shoulder blades together, draw your elbows back as you bend them. Keep your upper arm lifted away from your body throughout the exercise.  Hold 3 seconds. Slowly ease the tension on the band/tubing as you reverse the directions and return to the starting position. Repeat 2 times. Complete this exercise 3 times per week.  STRENGTH - Scapular Depressors  Find a sturdy chair without wheels, such as a from a dining room table.  Keeping your feet on the floor, lift your bottom from the seat and lock your elbows.  Keeping your elbows straight, allow gravity to pull your body weight down. Your shoulders will rise toward your ears.  Raise your body against gravity by drawing your shoulder blades down your back, shortening the distance between your shoulders and ears. Although your feet should always maintain contact with the floor, your feet should progressively support less body weight as you get stronger.  Hold 3 seconds. In a controlled and slow manner, lower your body weight to begin the next repetition. Repeat 2 times. Complete this exercise 3 times per week.   This information is not intended to replace advice given to you by your health  care provider. Make sure you discuss any questions you have with your health care provider.  Document Released: 02/16/2005 Document Revised: 04/25/2014 Document Reviewed: 07/17/2008 Elsevier Interactive Patient Education [employer].

## 2018-09-29 ENCOUNTER — Encounter: Payer: Self-pay | Admitting: Family Medicine

## 2018-10-25 ENCOUNTER — Other Ambulatory Visit: Payer: Self-pay

## 2018-10-25 ENCOUNTER — Ambulatory Visit (HOSPITAL_BASED_OUTPATIENT_CLINIC_OR_DEPARTMENT_OTHER)
Admission: RE | Admit: 2018-10-25 | Discharge: 2018-10-25 | Disposition: A | Discharge status: Home / Self Care | Payer: PPO | Source: Ambulatory Visit | Attending: Family Medicine | Admitting: Family Medicine

## 2018-10-25 DIAGNOSIS — M85851 Other specified disorders of bone density and structure, right thigh: Secondary | ICD-10-CM | POA: Diagnosis not present

## 2018-10-25 DIAGNOSIS — M810 Age-related osteoporosis without current pathological fracture: Secondary | ICD-10-CM | POA: Diagnosis not present

## 2018-10-25 DIAGNOSIS — Z780 Asymptomatic menopausal state: Secondary | ICD-10-CM | POA: Diagnosis not present

## 2018-10-25 DIAGNOSIS — E2839 Other primary ovarian failure: Secondary | ICD-10-CM | POA: Insufficient documentation

## 2018-10-26 ENCOUNTER — Other Ambulatory Visit: Payer: Self-pay | Admitting: Family Medicine

## 2018-10-26 ENCOUNTER — Encounter: Payer: Self-pay | Admitting: Family Medicine

## 2018-10-26 ENCOUNTER — Ambulatory Visit (INDEPENDENT_AMBULATORY_CARE_PROVIDER_SITE_OTHER): Payer: PPO | Admitting: Family Medicine

## 2018-10-26 VITALS — BP 128/78 | HR 108 | Temp 97.9°F | Ht 62.5 in | Wt 179.5 lb

## 2018-10-26 DIAGNOSIS — E039 Hypothyroidism, unspecified: Secondary | ICD-10-CM | POA: Diagnosis not present

## 2018-10-26 DIAGNOSIS — M810 Age-related osteoporosis without current pathological fracture: Secondary | ICD-10-CM | POA: Diagnosis not present

## 2018-10-26 DIAGNOSIS — N3001 Acute cystitis with hematuria: Secondary | ICD-10-CM | POA: Diagnosis not present

## 2018-10-26 LAB — POCT URINALYSIS DIPSTICK
Bilirubin, UA: NEGATIVE
Blood, UA: POSITIVE
Glucose, UA: NEGATIVE
Ketones, UA: NEGATIVE
Nitrite, UA: POSITIVE
Protein, UA: POSITIVE — AB
Spec Grav, UA: 1.015 (ref 1.010–1.025)
Urobilinogen, UA: 0.2 E.U./dL
pH, UA: 7.5 (ref 5.0–8.0)

## 2018-10-26 MED ORDER — PHENAZOPYRIDINE HCL 200 MG PO TABS: 200.0000 mg | ORAL_TABLET | Freq: Three times a day (TID) | ORAL | 0 refills | Status: DC | PRN

## 2018-10-26 MED ORDER — CEPHALEXIN 500 MG PO CAPS: 500.0000 mg | ORAL_CAPSULE | Freq: Two times a day (BID) | ORAL | 0 refills | Status: AC

## 2018-10-26 NOTE — Progress Notes (Signed)
Chief Complaint  Patient presents with  . Urinary Tract Infection    Shannon Bond is a 78 y.o. female here for possible UTI.  Duration: 1 week. Symptoms: urinary frequency and dysuria Denies: hematuria, urinary hesitancy, urinary retention, fever, nausea, vomiting, flank pain, vaginal discharge Hx of recurrent UTI? No Denies new sexual partners.  ROS:  Constitutional: denies fever GU: As noted in HPI  Past Medical History:  Diagnosis Date  . Alcohol abuse   . Allergy   . Arthritis   . Diverticulitis   . GERD (gastroesophageal reflux disease)   . Hypertension   . IBS (irritable bowel syndrome)   . Thyroid disease     BP 128/78 (BP Location: Left Arm, Patient Position: Sitting, Cuff Size: Normal)   Pulse (!) 108   Temp 97.9 F (36.6 C) (Oral)   Ht 5' 2.5" (1.588 m)   Wt 179 lb 8 oz (81.4 kg)   SpO2 99%   BMI 32.31 kg/m  General: Awake, alert, appears stated age Heart: RRR Lungs: CTAB, normal respiratory effort, no accessory muscle usage Abd: BS+, soft, NT, ND, no masses or organomegaly MSK: No CVA tenderness, neg Lloyd's sign Psych: Age appropriate judgment and insight  Acute cystitis with hematuria - Plan: cephALEXin (KEFLEX) 500 MG capsule, phenazopyridine (PYRIDIUM) 200 MG tablet, POCT Urinalysis Dipstick, Urine Culture  Osteoporosis, unspecified osteoporosis type, unspecified pathological fracture presence - Plan: Vitamin D (25 hydroxy), if nml, will start Fosamax  Hypothyroidism, unspecified type - Plan: TSH  Orders as above. Stay hydrated. Seek immediate care if pt starts to develop fevers, new/worsening symptoms, uncontrollable N/V. F/u prn. The patient voiced understanding and agreement to the plan.  [place], DO 10/26/18 3:32 PM

## 2018-10-26 NOTE — Telephone Encounter (Signed)
So the 2:15 opened up today and scheduled her to just come in

## 2018-10-26 NOTE — Patient Instructions (Signed)
Stay hydrated.   Warning signs/symptoms: Uncontrollable nausea/vomiting, fevers, worsening symptoms despite treatment, confusion.  Give [place] around 2 business days to get culture back to you.  Give [place] 2-3 business days to get the results of your labs back.   Let us know if you need anything.

## 2018-10-26 NOTE — Telephone Encounter (Signed)
I spoke to the patient this am.  She is having Dysuria.  She did check with a strip from the drug store and leukocytes were very positive. She is very uncomfortable.  Would like somehing called in Cipro? Also I did offer appt but you are booked 103% as of now

## 2018-10-28 ENCOUNTER — Other Ambulatory Visit: Payer: Self-pay | Admitting: Family Medicine

## 2018-10-28 LAB — URINE CULTURE
MICRO NUMBER:: 654905
SPECIMEN QUALITY:: ADEQUATE

## 2018-10-28 LAB — VITAMIN D 25 HYDROXY (VIT D DEFICIENCY, FRACTURES): Vit D, 25-Hydroxy: 57 ng/mL (ref 30–100)

## 2018-10-28 LAB — TSH: TSH: 3.46 mIU/L (ref 0.40–4.50)

## 2018-10-28 MED ORDER — ALENDRONATE SODIUM 70 MG PO TABS: 70.0000 mg | ORAL_TABLET | ORAL | 2 refills | Status: DC

## 2018-10-29 ENCOUNTER — Other Ambulatory Visit: Payer: PPO

## 2018-10-29 DIAGNOSIS — D0471 Carcinoma in situ of skin of right lower limb, including hip: Secondary | ICD-10-CM | POA: Diagnosis not present

## 2018-10-29 DIAGNOSIS — D485 Neoplasm of uncertain behavior of skin: Secondary | ICD-10-CM | POA: Diagnosis not present

## 2018-10-29 DIAGNOSIS — L570 Actinic keratosis: Secondary | ICD-10-CM | POA: Diagnosis not present

## 2018-10-29 DIAGNOSIS — Z85828 Personal history of other malignant neoplasm of skin: Secondary | ICD-10-CM | POA: Diagnosis not present

## 2018-10-29 DIAGNOSIS — L821 Other seborrheic keratosis: Secondary | ICD-10-CM | POA: Diagnosis not present

## 2018-11-08 ENCOUNTER — Other Ambulatory Visit: Payer: Self-pay | Admitting: Family Medicine

## 2018-11-15 DIAGNOSIS — D0471 Carcinoma in situ of skin of right lower limb, including hip: Secondary | ICD-10-CM | POA: Diagnosis not present

## 2018-11-26 ENCOUNTER — Encounter: Payer: Self-pay | Admitting: Family Medicine

## 2018-11-26 ENCOUNTER — Ambulatory Visit (INDEPENDENT_AMBULATORY_CARE_PROVIDER_SITE_OTHER): Payer: PPO | Admitting: Family Medicine

## 2018-11-26 ENCOUNTER — Ambulatory Visit: Payer: PPO | Admitting: Family Medicine

## 2018-11-26 ENCOUNTER — Other Ambulatory Visit: Payer: Self-pay

## 2018-11-26 VITALS — BP 120/74 | HR 82 | Temp 98.2°F | Ht 62.5 in | Wt 183.4 lb

## 2018-11-26 DIAGNOSIS — M810 Age-related osteoporosis without current pathological fracture: Secondary | ICD-10-CM

## 2018-11-26 DIAGNOSIS — E039 Hypothyroidism, unspecified: Secondary | ICD-10-CM

## 2018-11-26 MED ORDER — LEVOTHYROXINE SODIUM 88 MCG PO TABS: 88.0000 ug | ORAL_TABLET | Freq: Every day | ORAL | 3 refills | Status: DC

## 2018-11-26 NOTE — Progress Notes (Signed)
Chief Complaint  Patient presents with  . Results    bond density    Subjective: Patient is a 78 y.o. female here for bone density results.  Ca 1200 mg and is taking 2000 IU Vit D daily. Started Fosamax. Not having issues with this like she did in the past. Works out near daily. Diet is healthy. No pain.  Hypothyroidism Patient presents for follow-up of hypothyroidism.  Reports compliance with medication- levothyrox 75 mcg/d. Current symptoms include: fatigue, DOE and weight gain Denies: swelling, losing hair, anxiousness, tremulousness, palpitations, sweating and weight loss She believes her dose should be increased  ROS: MSK: No fractures Endo: As noted in HPI  Past Medical History:  Diagnosis Date  . Alcohol abuse   . Allergy   . Arthritis   . Diverticulitis   . GERD (gastroesophageal reflux disease)   . Hypertension   . IBS (irritable bowel syndrome)   . Thyroid disease     Objective: BP 120/74 (BP Location: Left Arm, Patient Position: Sitting, Cuff Size: Large)   Pulse 82   Temp 98.2 F (36.8 C) (Oral)   Ht 5' 2.5" (1.588 m)   Wt 183 lb 6 oz (83.2 kg)   SpO2 97%   BMI 33.01 kg/m  General: Awake, appears stated age HEENT: MMM, EOMi Neck: No asymmetry, no nodules or thyromeg Heart: RRR, no bruits, no LE edema Lungs: CTAB, no rales, wheezes or rhonchi. No accessory muscle use Psych: Age appropriate judgment and insight, normal affect and mood  Assessment and Plan: Osteoporosis, unspecified osteoporosis type, unspecified pathological fracture presence - Plan: Cont Ca/Vit D supp, wt bearing ex, Fosamax weekly.  Hypothyroidism, unspecified type - Plan: levothyroxine (SYNTHROID) 88 MCG tablet, increase to 88 mcg/d from 75. Ck in 6 weeks .  The patient voiced understanding and agreement to the plan.  [place], DO 11/26/18  10:21 AM

## 2018-11-26 NOTE — Patient Instructions (Addendum)
Keep the diet clean and stay active.  Let me know if we think we had increased the thyroid medicine too much.  Stay active.  Continue on the Vit D and calcium.   Let us know if you need anything.

## 2018-12-27 ENCOUNTER — Encounter: Payer: Self-pay | Admitting: Family Medicine

## 2018-12-31 MED ORDER — ESTRADIOL 0.1 MG/GM VA CREA: 1.0000 | TOPICAL_CREAM | Freq: Every day | VAGINAL | 1 refills | Status: DC

## 2019-01-07 ENCOUNTER — Other Ambulatory Visit: Payer: Self-pay

## 2019-01-07 ENCOUNTER — Other Ambulatory Visit (INDEPENDENT_AMBULATORY_CARE_PROVIDER_SITE_OTHER): Payer: PPO

## 2019-01-07 DIAGNOSIS — E039 Hypothyroidism, unspecified: Secondary | ICD-10-CM

## 2019-01-07 LAB — TSH: TSH: 2.8 u[IU]/mL (ref 0.35–4.50)

## 2019-01-22 DIAGNOSIS — M25562 Pain in left knee: Secondary | ICD-10-CM | POA: Diagnosis not present

## 2019-01-22 DIAGNOSIS — M25561 Pain in right knee: Secondary | ICD-10-CM | POA: Diagnosis not present

## 2019-01-24 DIAGNOSIS — D1801 Hemangioma of skin and subcutaneous tissue: Secondary | ICD-10-CM | POA: Diagnosis not present

## 2019-01-24 DIAGNOSIS — L814 Other melanin hyperpigmentation: Secondary | ICD-10-CM | POA: Diagnosis not present

## 2019-01-24 DIAGNOSIS — L821 Other seborrheic keratosis: Secondary | ICD-10-CM | POA: Diagnosis not present

## 2019-01-24 DIAGNOSIS — L570 Actinic keratosis: Secondary | ICD-10-CM | POA: Diagnosis not present

## 2019-01-24 DIAGNOSIS — D045 Carcinoma in situ of skin of trunk: Secondary | ICD-10-CM | POA: Diagnosis not present

## 2019-01-24 DIAGNOSIS — Z85828 Personal history of other malignant neoplasm of skin: Secondary | ICD-10-CM | POA: Diagnosis not present

## 2019-01-24 DIAGNOSIS — Z23 Encounter for immunization: Secondary | ICD-10-CM | POA: Diagnosis not present

## 2019-01-24 DIAGNOSIS — D485 Neoplasm of uncertain behavior of skin: Secondary | ICD-10-CM | POA: Diagnosis not present

## 2019-02-02 ENCOUNTER — Other Ambulatory Visit: Payer: Self-pay | Admitting: Family Medicine

## 2019-02-02 DIAGNOSIS — I10 Essential (primary) hypertension: Secondary | ICD-10-CM

## 2019-02-11 ENCOUNTER — Other Ambulatory Visit: Payer: Self-pay | Admitting: Family Medicine

## 2019-02-28 DIAGNOSIS — D099 Carcinoma in situ, unspecified: Secondary | ICD-10-CM | POA: Diagnosis not present

## 2019-03-03 ENCOUNTER — Other Ambulatory Visit: Payer: Self-pay | Admitting: Family Medicine

## 2019-03-13 ENCOUNTER — Other Ambulatory Visit: Payer: Self-pay

## 2019-03-18 ENCOUNTER — Other Ambulatory Visit: Payer: Self-pay | Admitting: Family Medicine

## 2019-03-18 DIAGNOSIS — E039 Hypothyroidism, unspecified: Secondary | ICD-10-CM

## 2019-03-29 ENCOUNTER — Other Ambulatory Visit: Payer: Self-pay

## 2019-04-01 ENCOUNTER — Ambulatory Visit (INDEPENDENT_AMBULATORY_CARE_PROVIDER_SITE_OTHER): Payer: PPO | Admitting: Family Medicine

## 2019-04-01 ENCOUNTER — Encounter: Payer: Self-pay | Admitting: Family Medicine

## 2019-04-01 ENCOUNTER — Other Ambulatory Visit: Payer: Self-pay

## 2019-04-01 VITALS — BP 120/70 | HR 80 | Temp 96.5°F | Ht 62.5 in | Wt 187.0 lb

## 2019-04-01 DIAGNOSIS — I10 Essential (primary) hypertension: Secondary | ICD-10-CM | POA: Diagnosis not present

## 2019-04-01 DIAGNOSIS — E039 Hypothyroidism, unspecified: Secondary | ICD-10-CM | POA: Diagnosis not present

## 2019-04-01 DIAGNOSIS — E669 Obesity, unspecified: Secondary | ICD-10-CM

## 2019-04-01 DIAGNOSIS — M810 Age-related osteoporosis without current pathological fracture: Secondary | ICD-10-CM

## 2019-04-01 DIAGNOSIS — Z1231 Encounter for screening mammogram for malignant neoplasm of breast: Secondary | ICD-10-CM | POA: Diagnosis not present

## 2019-04-01 HISTORY — DX: Obesity, unspecified: E66.9

## 2019-04-01 HISTORY — DX: Age-related osteoporosis without current pathological fracture: M81.0

## 2019-04-01 NOTE — Progress Notes (Signed)
Chief Complaint  Patient presents with  . Follow-up    6 month'    Subjective Shannon Bond is a 78 y.o. female who presents for hypertension follow up. She does not monitor home blood pressures. She is compliant with medications-hydrochlorothiazide 25 mg daily, lisinopril 30 mg daily. Patient has these side effects of medication: none She is adhering to a healthy diet overall. Current exercise: yoga, wt resistance exercising, walking  Hypothyroidism Patient presents for follow-up of hypothyroidism.  Reports compliance with medication-levothyroxine 88 mcg daily. Current symptoms include: denies fatigue, weight changes, heat/cold intolerance, bowel/skin changes or CVS symptoms She believes her dose should be unchanged  Osteoporosis Patient has a history of osteoporosis for which she takes Fosamax 70 mg weekly for.  She is tolerating this well.  She does participate in weightbearing exercise.  Reports adequate calcium intake.  She also has a calcium/vitamin D supplement that she takes daily.  No falls or issues with balance.  Obesity Patient has been having issues losing weight.  She consumes between 1300-1500 cal daily.  Exercise as noted above.  She has never taken a medication nor is requesting a medication for weight loss.  Wondering what she can do.   Past Medical History:  Diagnosis Date  . Alcohol abuse   . Allergy   . Arthritis   . Diverticulitis   . GERD (gastroesophageal reflux disease)   . Hypertension   . IBS (irritable bowel syndrome)   . Thyroid disease     Review of Systems Cardiovascular: no chest pain Respiratory:  no shortness of breath  Exam BP 120/70 (BP Location: Left Arm, Patient Position: Sitting, Cuff Size: Normal)   Pulse 80   Temp (!) 96.5 F (35.8 C) (Temporal)   Ht 5' 2.5" (1.588 m)   Wt 187 lb (84.8 kg)   SpO2 96%   BMI 33.66 kg/m  General:  well developed, well nourished, in no apparent distress Heart: RRR, no bruits, no LE  edema Lungs: clear to auscultation, no accessory muscle use Psych: well oriented with normal range of affect and appropriate judgment/insight  Obesity (BMI 30-39.9) - Plan: Amb Ref to Medical Weight Management  Essential hypertension  Hypothyroidism, unspecified type  Osteoporosis, unspecified osteoporosis type, unspecified pathological fracture presence  Visit for screening mammogram - Plan: MM Digital Screening  1- Counseled on diet and exercise.  Refer to medical weight loss management team. 2-continue hydrochlorothiazide and lisinopril. 3-continue Synthroid.  Will check TSH at next visit. 4-continue Fosamax. 5-counseled on breast cancer screening in women past the age of 29.  She has a good quality of life and a family history of breast cancer.  We decided to continue screening. F/u in 6 mo. The patient voiced understanding and agreement to the plan.  [place], DO 04/01/19  12:17 PM

## 2019-04-01 NOTE — Patient Instructions (Signed)
Keep the diet clean and stay active.  If you do not hear anything about your referral in the next 1-2 weeks, call our office and ask for an update.  Let us know if you need anything.

## 2019-04-10 ENCOUNTER — Ambulatory Visit (HOSPITAL_BASED_OUTPATIENT_CLINIC_OR_DEPARTMENT_OTHER)
Admission: RE | Admit: 2019-04-10 | Discharge: 2019-04-10 | Disposition: A | Discharge status: Home / Self Care | Payer: PPO | Source: Ambulatory Visit | Attending: Family Medicine | Admitting: Family Medicine

## 2019-04-10 ENCOUNTER — Other Ambulatory Visit: Payer: Self-pay

## 2019-04-10 DIAGNOSIS — Z1231 Encounter for screening mammogram for malignant neoplasm of breast: Secondary | ICD-10-CM | POA: Insufficient documentation

## 2019-04-20 ENCOUNTER — Encounter: Payer: Self-pay | Admitting: Family Medicine

## 2019-04-22 ENCOUNTER — Other Ambulatory Visit: Payer: Self-pay | Admitting: Family Medicine

## 2019-04-22 DIAGNOSIS — N289 Disorder of kidney and ureter, unspecified: Secondary | ICD-10-CM

## 2019-04-23 ENCOUNTER — Other Ambulatory Visit (INDEPENDENT_AMBULATORY_CARE_PROVIDER_SITE_OTHER): Payer: PPO

## 2019-04-23 ENCOUNTER — Other Ambulatory Visit: Payer: Self-pay | Admitting: Family Medicine

## 2019-04-23 ENCOUNTER — Other Ambulatory Visit: Payer: Self-pay

## 2019-04-23 ENCOUNTER — Encounter: Payer: Self-pay | Admitting: Family Medicine

## 2019-04-23 DIAGNOSIS — N289 Disorder of kidney and ureter, unspecified: Secondary | ICD-10-CM

## 2019-04-23 DIAGNOSIS — N179 Acute kidney failure, unspecified: Secondary | ICD-10-CM

## 2019-04-23 LAB — BASIC METABOLIC PANEL
BUN: 52 mg/dL — ABNORMAL HIGH (ref 6–23)
CO2: 25 mEq/L (ref 19–32)
Calcium: 9.9 mg/dL (ref 8.4–10.5)
Chloride: 100 mEq/L (ref 96–112)
Creatinine, Ser: 1.72 mg/dL — ABNORMAL HIGH (ref 0.40–1.20)
GFR: 28.7 mL/min — ABNORMAL LOW (ref >60.00)
Glucose, Bld: 86 mg/dL (ref 70–99)
Potassium: 4.3 mEq/L (ref 3.5–5.1)
Sodium: 134 mEq/L — ABNORMAL LOW (ref 135–145)

## 2019-04-23 MED ORDER — METOPROLOL SUCCINATE ER 50 MG PO TB24: 50.0000 mg | ORAL_TABLET | Freq: Every day | ORAL | 3 refills | Status: DC

## 2019-04-24 ENCOUNTER — Other Ambulatory Visit: Payer: Self-pay | Admitting: Family Medicine

## 2019-04-26 ENCOUNTER — Other Ambulatory Visit: Payer: Self-pay

## 2019-04-26 ENCOUNTER — Other Ambulatory Visit (INDEPENDENT_AMBULATORY_CARE_PROVIDER_SITE_OTHER): Payer: PPO

## 2019-04-26 DIAGNOSIS — N179 Acute kidney failure, unspecified: Secondary | ICD-10-CM | POA: Diagnosis not present

## 2019-04-26 LAB — BASIC METABOLIC PANEL
BUN: 55 mg/dL — ABNORMAL HIGH (ref 6–23)
CO2: 25 mEq/L (ref 19–32)
Calcium: 9.6 mg/dL (ref 8.4–10.5)
Chloride: 104 mEq/L (ref 96–112)
Creatinine, Ser: 1.27 mg/dL — ABNORMAL HIGH (ref 0.40–1.20)
GFR: 40.73 mL/min — ABNORMAL LOW (ref >60.00)
Glucose, Bld: 88 mg/dL (ref 70–99)
Potassium: 4.3 mEq/L (ref 3.5–5.1)
Sodium: 136 mEq/L (ref 135–145)

## 2019-04-29 ENCOUNTER — Other Ambulatory Visit: Payer: Self-pay | Admitting: Family Medicine

## 2019-04-29 DIAGNOSIS — N179 Acute kidney failure, unspecified: Secondary | ICD-10-CM

## 2019-05-14 ENCOUNTER — Other Ambulatory Visit: Payer: Self-pay | Admitting: Family Medicine

## 2019-05-14 MED ORDER — IPRATROPIUM BROMIDE 0.06 % NA SOLN: 2.0000 | NASAL | 3 refills | Status: DC | PRN

## 2019-05-20 ENCOUNTER — Other Ambulatory Visit: Payer: Self-pay

## 2019-05-20 ENCOUNTER — Other Ambulatory Visit (INDEPENDENT_AMBULATORY_CARE_PROVIDER_SITE_OTHER): Payer: PPO

## 2019-05-20 DIAGNOSIS — N179 Acute kidney failure, unspecified: Secondary | ICD-10-CM | POA: Diagnosis not present

## 2019-05-20 LAB — BASIC METABOLIC PANEL
BUN: 42 mg/dL — ABNORMAL HIGH (ref 6–23)
CO2: 23 mEq/L (ref 19–32)
Calcium: 9.7 mg/dL (ref 8.4–10.5)
Chloride: 103 mEq/L (ref 96–112)
Creatinine, Ser: 1.18 mg/dL (ref 0.40–1.20)
GFR: 44.33 mL/min — ABNORMAL LOW (ref >60.00)
Glucose, Bld: 103 mg/dL — ABNORMAL HIGH (ref 70–99)
Potassium: 4.6 mEq/L (ref 3.5–5.1)
Sodium: 136 mEq/L (ref 135–145)

## 2019-05-21 ENCOUNTER — Encounter: Payer: Self-pay | Admitting: Family Medicine

## 2019-05-22 ENCOUNTER — Encounter: Payer: Self-pay | Admitting: Family Medicine

## 2019-05-24 ENCOUNTER — Encounter: Payer: Self-pay | Admitting: Family Medicine

## 2019-05-24 ENCOUNTER — Ambulatory Visit (INDEPENDENT_AMBULATORY_CARE_PROVIDER_SITE_OTHER): Payer: PPO | Admitting: Family Medicine

## 2019-05-24 ENCOUNTER — Other Ambulatory Visit: Payer: Self-pay

## 2019-05-24 VITALS — BP 111/72 | HR 60 | Temp 96.7°F | Wt 186.0 lb

## 2019-05-24 DIAGNOSIS — R0600 Dyspnea, unspecified: Secondary | ICD-10-CM | POA: Diagnosis not present

## 2019-05-24 DIAGNOSIS — R0609 Other forms of dyspnea: Secondary | ICD-10-CM

## 2019-05-24 DIAGNOSIS — R5383 Other fatigue: Secondary | ICD-10-CM | POA: Diagnosis not present

## 2019-05-24 MED ORDER — ALBUTEROL SULFATE HFA 108 (90 BASE) MCG/ACT IN AERS: 2.0000 | INHALATION_SPRAY | Freq: Four times a day (QID) | RESPIRATORY_TRACT | 0 refills | Status: DC | PRN

## 2019-05-24 NOTE — Progress Notes (Signed)
CC: Shannon Bond  Subjective: Patient is a 79 y.o. female here for Shannon Bond. Due to COVID-19 pandemic, we are interacting via web portal for an electronic face-to-face visit. I verified patient's ID using 2 identifiers. Patient agreed to proceed with visit via this method. Patient is at home, I am at office. Patient and I are present for visit.   Has been having Shannon Bond and fatigue for the past 7-8 mo. Thought it was due to the heat but did not get better when it calmed down.  She denies any explain weight changes or swelling in her lower extremities.  She is not having any chest pain.  Diet is been fair and she tries to work out but get short of breath rather quickly.  She thought it is due to her kidneys but they are improving as well.  Of note, her daughter had similar symptoms and was found to have hemochromatosis.  She denies any rectal/vaginal bleeding or blood in the urine.  She is not a smoker but was exposed to secondhand smoke for nearly 50 years.  ROS: Heart: Denies chest pain  Lungs: Denies current SOB   Past Medical History:  Diagnosis Date  . Alcohol abuse   . Allergy   . Arthritis   . Diverticulitis   . GERD (gastroesophageal reflux disease)   . Hypertension   . IBS (irritable bowel syndrome)   . Thyroid disease     Objective: BP 111/72 (BP Location: Left Arm, Patient Position: Sitting, Cuff Size: Normal)   Pulse 60   Temp (!) 96.7 F (35.9 C) (Oral)   Wt 186 lb (84.4 kg)   BMI 33.48 kg/m  No conversational dyspnea Age appropriate judgment and insight Nml affect and mood  Assessment and Plan: Shannon Bond (dyspnea on exertion) - Plan: albuterol (VENTOLIN HFA) 108 (90 Base) MCG/ACT inhaler, CBC, IBC + Ferritin, Comprehensive metabolic panel  Fatigue, unspecified type - Plan: CBC, IBC + Ferritin, Comprehensive metabolic panel, T4, free, TSH  1-trial albuterol given history of secondhand smoke exposure.  We will check some labs.  If no improvement we will get a chest x-ray and possibly an  echo assuming all prior studies are normal. 2-I think if we fix #1, her fatigue will improve. Follow-up pending the above. The patient voiced understanding and agreement to the plan.  [place], DO 05/24/19  5:02 PM

## 2019-05-28 ENCOUNTER — Other Ambulatory Visit: Payer: Self-pay

## 2019-05-28 ENCOUNTER — Other Ambulatory Visit (INDEPENDENT_AMBULATORY_CARE_PROVIDER_SITE_OTHER): Payer: PPO

## 2019-05-28 DIAGNOSIS — R0609 Other forms of dyspnea: Secondary | ICD-10-CM

## 2019-05-28 DIAGNOSIS — R0600 Dyspnea, unspecified: Secondary | ICD-10-CM | POA: Diagnosis not present

## 2019-05-28 DIAGNOSIS — R5383 Other fatigue: Secondary | ICD-10-CM | POA: Diagnosis not present

## 2019-05-29 ENCOUNTER — Other Ambulatory Visit: Payer: Self-pay | Admitting: Family Medicine

## 2019-05-29 ENCOUNTER — Encounter: Payer: Self-pay | Admitting: Family Medicine

## 2019-05-29 DIAGNOSIS — N1832 Chronic kidney disease, stage 3b: Secondary | ICD-10-CM

## 2019-05-29 LAB — COMPREHENSIVE METABOLIC PANEL
ALT: 17 U/L (ref 0–35)
AST: 23 U/L (ref 0–37)
Albumin: 4.2 g/dL (ref 3.5–5.2)
Alkaline Phosphatase: 52 U/L (ref 39–117)
BUN: 47 mg/dL — ABNORMAL HIGH (ref 6–23)
CO2: 23 mEq/L (ref 19–32)
Calcium: 9 mg/dL (ref 8.4–10.5)
Chloride: 102 mEq/L (ref 96–112)
Creatinine, Ser: 1.64 mg/dL — ABNORMAL HIGH (ref 0.40–1.20)
GFR: 30.32 mL/min — ABNORMAL LOW (ref >60.00)
Glucose, Bld: 96 mg/dL (ref 70–99)
Potassium: 4.5 mEq/L (ref 3.5–5.1)
Sodium: 133 mEq/L — ABNORMAL LOW (ref 135–145)
Total Bilirubin: 0.4 mg/dL (ref 0.2–1.2)
Total Protein: 6.6 g/dL (ref 6.0–8.3)

## 2019-05-29 LAB — CBC
HCT: 38.2 % (ref 36.0–46.0)
Hemoglobin: 12.9 g/dL (ref 12.0–15.0)
MCHC: 33.7 g/dL (ref 30.0–36.0)
MCV: 96.8 fl (ref 78.0–100.0)
Platelets: 315 10*3/uL (ref 150.0–400.0)
RBC: 3.95 Mil/uL (ref 3.87–5.11)
RDW: 13.2 % (ref 11.5–15.5)
WBC: 9.5 10*3/uL (ref 4.0–10.5)

## 2019-05-29 LAB — IBC + FERRITIN
Ferritin: 88.6 ng/mL (ref 10.0–291.0)
Iron: 88 ug/dL (ref 42–145)
Saturation Ratios: 31.7 % (ref 20.0–50.0)
Transferrin: 198 mg/dL — ABNORMAL LOW (ref 212.0–360.0)

## 2019-05-29 LAB — T4, FREE: Free T4: 1.11 ng/dL (ref 0.60–1.60)

## 2019-05-29 LAB — TSH: TSH: 3.19 u[IU]/mL (ref 0.35–4.50)

## 2019-05-29 MED ORDER — LEVOTHYROXINE SODIUM 100 MCG PO TABS: 100.0000 ug | ORAL_TABLET | Freq: Every day | ORAL | 3 refills | Status: DC

## 2019-06-02 ENCOUNTER — Ambulatory Visit: Payer: PPO | Attending: Internal Medicine

## 2019-06-02 DIAGNOSIS — Z23 Encounter for immunization: Secondary | ICD-10-CM

## 2019-06-02 NOTE — Progress Notes (Signed)
   Covid-19 Vaccination Clinic  Name:  Shannon Bond    MRN: [id number] DOB: 1940/11/19  06/02/2019  Shannon Bond was observed post Covid-19 immunization for 15 minutes without incidence. She was provided with Vaccine Information Sheet and instruction to access the V-Safe system.   Shannon Bond was instructed to call 911 with any severe reactions post vaccine: Marland Kitchen Difficulty breathing  . Swelling of your face and throat  . A fast heartbeat  . A bad rash all over your body  . Dizziness and weakness    Immunizations Administered    Name Date Dose VIS Date Route   Pfizer COVID-19 Vaccine 06/02/2019 11:08 AM 0.3 mL 03/29/2019 Intramuscular   Manufacturer: [place]   Lot: [id number]   [place]: [id number]

## 2019-06-07 ENCOUNTER — Encounter: Payer: Self-pay | Admitting: Family Medicine

## 2019-06-16 ENCOUNTER — Other Ambulatory Visit: Payer: Self-pay | Admitting: Family Medicine

## 2019-06-16 DIAGNOSIS — R0609 Other forms of dyspnea: Secondary | ICD-10-CM

## 2019-06-16 DIAGNOSIS — R0600 Dyspnea, unspecified: Secondary | ICD-10-CM

## 2019-06-17 ENCOUNTER — Encounter: Payer: Self-pay | Admitting: Family Medicine

## 2019-06-24 ENCOUNTER — Ambulatory Visit (INDEPENDENT_AMBULATORY_CARE_PROVIDER_SITE_OTHER): Payer: PPO | Admitting: Bariatrics

## 2019-06-24 ENCOUNTER — Encounter (INDEPENDENT_AMBULATORY_CARE_PROVIDER_SITE_OTHER): Payer: Self-pay | Admitting: Bariatrics

## 2019-06-24 ENCOUNTER — Other Ambulatory Visit: Payer: Self-pay

## 2019-06-24 VITALS — BP 128/80 | HR 82 | Temp 97.9°F | Ht 62.0 in | Wt 183.0 lb

## 2019-06-24 DIAGNOSIS — I10 Essential (primary) hypertension: Secondary | ICD-10-CM

## 2019-06-24 DIAGNOSIS — R5383 Other fatigue: Secondary | ICD-10-CM | POA: Diagnosis not present

## 2019-06-24 DIAGNOSIS — N1832 Chronic kidney disease, stage 3b: Secondary | ICD-10-CM | POA: Diagnosis not present

## 2019-06-24 DIAGNOSIS — R7309 Other abnormal glucose: Secondary | ICD-10-CM | POA: Diagnosis not present

## 2019-06-24 DIAGNOSIS — Z1331 Encounter for screening for depression: Secondary | ICD-10-CM

## 2019-06-24 DIAGNOSIS — R0602 Shortness of breath: Secondary | ICD-10-CM | POA: Diagnosis not present

## 2019-06-24 DIAGNOSIS — E039 Hypothyroidism, unspecified: Secondary | ICD-10-CM

## 2019-06-24 DIAGNOSIS — E669 Obesity, unspecified: Secondary | ICD-10-CM | POA: Diagnosis not present

## 2019-06-24 DIAGNOSIS — Z0289 Encounter for other administrative examinations: Secondary | ICD-10-CM

## 2019-06-24 DIAGNOSIS — Z6833 Body mass index (BMI) 33.0-33.9, adult: Secondary | ICD-10-CM | POA: Diagnosis not present

## 2019-06-24 DIAGNOSIS — E538 Deficiency of other specified B group vitamins: Secondary | ICD-10-CM | POA: Diagnosis not present

## 2019-06-24 DIAGNOSIS — E559 Vitamin D deficiency, unspecified: Secondary | ICD-10-CM

## 2019-06-24 DIAGNOSIS — K900 Celiac disease: Secondary | ICD-10-CM

## 2019-06-24 NOTE — Progress Notes (Signed)
Chief Complaint:   OBESITY Ronnica Dreese (MR# [id number]) is a 79 y.o. female who presents for evaluation and treatment of obesity and related comorbidities. Current BMI is Body mass index is 33.47 kg/m.Meyer Cory has been struggling with her weight for many years and has been unsuccessful in either losing weight, maintaining weight loss, or reaching her healthy weight goal.  Syesha is currently in the action stage of change and ready to dedicate time achieving and maintaining a healthier weight. Kora is interested in becoming our patient and working on intensive lifestyle modifications including (but not limited to) diet and exercise for weight loss.  Takiesha's RMR was 712, but this may not be correct as there was not a good seal and had to be done x2. She does not like to cook. She craves popcorn.  Baillie's habits were reviewed today and are as follows: Her family eats meals together, she thinks her family will eat healthier with her, her desired weight loss is 23 lbs, she started gaining weight at age 18 and at age 26, her heaviest weight ever was 206 pounds and she craves popcorn.  Depression Screen Lanell's Food and Mood (modified PHQ-9) score was 7.  Depression screen [facility] 2/9 06/24/2019  Decreased Interest 0  Down, Depressed, Hopeless 1  PHQ - 2 Score 1  Altered sleeping 3  Tired, decreased energy 3  Change in appetite 0  Feeling bad or failure about yourself  0  Trouble concentrating 0  Moving slowly or fidgety/restless 0  Suicidal thoughts 0  PHQ-9 Score 7   Subjective:   Other fatigue. Junie denies daytime somnolence and admits to waking up still tired. Shakina generally gets 8 hours of sleep per night, and states that she does not sleep well most nights. Snoring is not present. Apneic episodes are not present. Epworth Sleepiness Score is 3.  Shortness of breath on exertion. Shemika notes increasing shortness of breath with exercising and seems to be worsening over time  with weight gain. She notes getting out of breath sooner with activity than she used to. This has gotten worse recently. Analie denies shortness of breath at rest or orthopnea. She has a questionable history of RAD.   Essential hypertension with CKD stage 3b. Lovey is taking HCTZ and lisinopril. Blood pressure is well controlled with medications.  BP Readings from Last 3 Encounters:  06/24/19 128/80  05/24/19 111/72  04/01/19 120/70   Lab Results  Component Value Date   CREATININE 1.64 (H) 05/28/2019   CREATININE 1.18 05/20/2019   CREATININE 1.27 (H) 04/26/2019   Vitamin D deficiency. Kacie is taking Vitamin D OTC.  B12 nutritional deficiency. Moriah is taking B12 sublingual OTC.   No results found for: VITAMINB12  Hypothyroidism, unspecified type. Layza is taking Synthroid. She reports heat and cold intolerance.   Lab Results  Component Value Date   TSH 3.19 05/28/2019   Celiac disease. Tracey reports having celiac disease per an endoscopy test (but not eating gluten).  Stage 3b chronic kidney disease. Kaisley reports having no treatment.  Elevated glucose. Bernedette has a history of some elevated blood glucose readings without a diagnosis of diabetes. Glucose 103.  Depression screening. Avonell had a mildly positive depression screen with a PHQ-9 score of 7.  Assessment/Plan:   Other fatigue. Iolani does feel that her weight is causing her energy to be lower than it should be. Fatigue may be related to obesity, depression or many other causes. Labs will be ordered, and in  the meanwhile, Meryn will focus on self care including making healthy food choices, increasing physical activity and focusing on stress reduction. EKG 12-Lead performed.  Shortness of breath on exertion. Tishia does feel that she gets out of breath more easily that she used to when she exercises. Shamara's shortness of breath appears to be obesity related and exercise induced. She has agreed to work on weight  loss and gradually increase exercise to treat her exercise induced shortness of breath. Will continue to monitor closely. She will use Albuterol inhaler prior to exercise.  Essential hypertension with CKD stage 3b. Carmalita is working on healthy weight loss and exercise to improve blood pressure control. We will watch for signs of hypotension as she continues her lifestyle modifications. She will continue her medications as directed.  Vitamin D deficiency. Low Vitamin D level contributes to fatigue and are associated with obesity, breast, and colon cancer. She agrees to continue to take Vitamin D and will follow-up for routine testing of Vitamin D, at least 2-3 times per year to avoid over-replacement.  B12 nutritional deficiency. The diagnosis was reviewed with the patient. Counseling provided today, see below. We will continue to monitor. Orders and follow up as documented in patient record. Vitamin B12 level ordered. She will continue taking B12 as is.  Counseling . The body needs vitamin B12: to make red blood cells; to make DNA; and to help the nerves work properly so they can carry messages from the brain to the body.  . The main causes of vitamin B12 deficiency include dietary deficiency, digestive diseases, pernicious anemia, and having a surgery in which part of the stomach or small intestine is removed.  . Certain medicines can make it harder for the body to absorb vitamin B12. These medicines include: heartburn medications; some antibiotics; some medications used to treat diabetes, gout, and high cholesterol.  . In some cases, there are no symptoms of this condition. If the condition leads to anemia or nerve damage, various symptoms can occur, such as weakness or fatigue, shortness of breath, and numbness or tingling in your hands and feet.   . Treatment:  o May include taking vitamin B12 supplements.  o Avoid alcohol.  o Eat lots of healthy foods that contain vitamin B12: - Beef, pork,  chicken, [place], and organ meats, such as liver.  - Seafood: This includes clams, rainbow trout, salmon, tuna, and haddock.  - Eggs.  - Cereal and dairy products that are fortified: This means that vitamin B12 has been added to the food.      Hypothyroidism, unspecified type. Patient with long-standing hypothyroidism, on levothyroxine therapy. She appears euthyroid. Orders and follow up as documented in patient record. Rosary will continue her medications as directed.  Counseling . Good thyroid control is important for overall health. Supratherapeutic thyroid levels are dangerous and will not improve weight loss results. . The correct way to take levothyroxine is fasting, with water, separated by at least 30 minutes from breakfast, and separated by more than 4 hours from calcium, iron, multivitamins, acid reflux medications (PPIs).   Celiac disease. Elya will continue to abstain from gluten. She will follow-up with her PCP and with GI.  Stage 3b chronic kidney disease. Graclynn has an appointment with the nephrologist on Thursday.  Elevated glucose. Fasting labs will be obtained and results with be discussed with Patrecia in 2 weeks at her follow up visit. In the meanwhile Samaa was started on a lower simple carbohydrate diet and will work on  weight loss efforts.  Hemoglobin A1c, Insulin, random labs ordered.  Depression screening. Merica had a positive depression screening. Depression is commonly associated with obesity and often results in emotional eating behaviors. We will monitor this closely and work on CBT to help improve the non-hunger eating patterns. Referral to Psychology may be required if no improvement is seen as she continues in our clinic.  Class 1 obesity with serious comorbidity and body mass index (BMI) of 33.0 to 33.9 in adult, unspecified obesity type.  Meyli is currently in the action stage of change and her goal is to continue with weight loss efforts. I recommend Janalyn  begin the structured treatment plan as follows:  She has agreed to the Category 1 Plan.  She will start the Category 1 plan despite her decreased RMR (questionable IC). She will work on meal planning.  We reviewed labs with the patient from 05/28/2019 including CMP, iron/anemia profile, and CBC.  Exercise goals: Henrine exercises approximately 8 hours a week.   Behavioral modification strategies: increasing lean protein intake, decreasing simple carbohydrates, increasing vegetables, increasing water intake, decreasing eating out, no skipping meals, meal planning and cooking strategies, keeping healthy foods in the home and planning for success.  She was informed of the importance of frequent follow-up visits to maximize her success with intensive lifestyle modifications for her multiple health conditions. She was informed we would discuss her lab results at her next visit unless there is a critical issue that needs to be addressed sooner. Riannon agreed to keep her next visit at the agreed upon time to discuss these results.  Objective:   Blood pressure 128/80, pulse 82, temperature 97.9 F (36.6 C), height 5' 2"  (1.575 m), weight 183 lb (83 kg), SpO2 98 %. Body mass index is 33.47 kg/m.  EKG: Sinus  Rhythm with a rate of 81 BPM. Low voltage in precordial leads may be related to body habitus. RSR(V1) - possible incomplete right bundle branch block and anterior fascicular block. Otherwise normal.  Indirect Calorimeter completed today shows a VO2 of 102 and a REE of 712.  Her calculated basal metabolic rate is 4132 thus her basal metabolic rate is worse than expected.  General: Cooperative, alert, well developed, in no acute distress. HEENT: Conjunctivae and lids unremarkable. Cardiovascular: Regular rhythm.  Lungs: Normal work of breathing. Neurologic: No focal deficits.   Lab Results  Component Value Date   CREATININE 1.64 (H) 05/28/2019   BUN 47 (H) 05/28/2019   NA 133 (L) 05/28/2019    K 4.5 05/28/2019   CL 102 05/28/2019   CO2 23 05/28/2019   Lab Results  Component Value Date   ALT 17 05/28/2019   AST 23 05/28/2019   ALKPHOS 52 05/28/2019   BILITOT 0.4 05/28/2019   No results found for: HGBA1C No results found for: INSULIN Lab Results  Component Value Date   TSH 3.19 05/28/2019   Lab Results  Component Value Date   CHOL 187 09/28/2018   HDL 57.50 09/28/2018   LDLCALC 94 09/28/2018   TRIG 179.0 (H) 09/28/2018   CHOLHDL 3 09/28/2018   Lab Results  Component Value Date   WBC 9.5 05/28/2019   HGB 12.9 05/28/2019   HCT 38.2 05/28/2019   MCV 96.8 05/28/2019   PLT 315.0 05/28/2019   Lab Results  Component Value Date   IRON 88 05/28/2019   FERRITIN 88.6 05/28/2019    Obesity Behavioral Intervention Visit Documentation for Insurance:   Approximately 15 minutes were spent on the  discussion below.  ASK: We discussed the diagnosis of obesity with Saleah today and Jayah agreed to give [place] permission to discuss obesity behavioral modification therapy today.  ASSESS: Adiah has the diagnosis of obesity and her BMI today is 33.5. Lisvet is in the action stage of change.   ADVISE: Gwendolynn was educated on the multiple health risks of obesity as well as the benefit of weight loss to improve her health. She was advised of the need for long term treatment and the importance of lifestyle modifications to improve her current health and to decrease her risk of future health problems.  AGREE: Multiple dietary modification options and treatment options were discussed and Karissa agreed to follow the recommendations documented in the above note.  ARRANGE: Derionna was educated on the importance of frequent visits to treat obesity as outlined per CMS and USPSTF guidelines and agreed to schedule her next follow up appointment today.  Attestation Statements:   Reviewed by clinician on day of visit: allergies, medications, problem list, medical history, surgical  history, family history, social history, and previous encounter notes.  [name], am acting as Location manager for [employer], DO   I have reviewed the above documentation for accuracy and completeness, and I agree with the above. Jearld Lesch, DO

## 2019-06-25 ENCOUNTER — Ambulatory Visit: Payer: PPO | Attending: Internal Medicine

## 2019-06-25 DIAGNOSIS — Z23 Encounter for immunization: Secondary | ICD-10-CM

## 2019-06-25 LAB — HEMOGLOBIN A1C
Est. average glucose Bld gHb Est-mCnc: 100 mg/dL
Hgb A1c MFr Bld: 5.1 % (ref 4.8–5.6)

## 2019-06-25 LAB — VITAMIN B12: Vitamin B-12: 1976 pg/mL — ABNORMAL HIGH (ref 232–1245)

## 2019-06-25 LAB — INSULIN, RANDOM: INSULIN: 5.6 u[IU]/mL (ref 2.6–24.9)

## 2019-06-25 NOTE — Progress Notes (Signed)
   Covid-19 Vaccination Clinic  Name:  Shannon Bond    MRN: [id number] DOB: [birth year]  06/25/2019  [name] was observed post Covid-19 immunization for 15 minutes without incident. She was provided with Vaccine Information Sheet and instruction to access the V-Safe system.   [name] was instructed to call 911 with any severe reactions post vaccine: Marland Kitchen Difficulty breathing  . Swelling of face and throat  . A fast heartbeat  . A bad rash all over body  . Dizziness and weakness   Immunizations Administered    Name Date Dose VIS Date Route   Pfizer COVID-19 Vaccine 06/25/2019  1:41 PM 0.3 mL 03/29/2019 Intramuscular   Manufacturer: [place]   Lot: [id number]   [place]: [id number]

## 2019-06-27 ENCOUNTER — Encounter: Payer: Self-pay | Admitting: Family Medicine

## 2019-06-27 DIAGNOSIS — N1832 Chronic kidney disease, stage 3b: Secondary | ICD-10-CM | POA: Diagnosis not present

## 2019-06-27 DIAGNOSIS — N2581 Secondary hyperparathyroidism of renal origin: Secondary | ICD-10-CM | POA: Diagnosis not present

## 2019-06-27 DIAGNOSIS — K219 Gastro-esophageal reflux disease without esophagitis: Secondary | ICD-10-CM | POA: Diagnosis not present

## 2019-06-27 DIAGNOSIS — N189 Chronic kidney disease, unspecified: Secondary | ICD-10-CM | POA: Diagnosis not present

## 2019-06-27 DIAGNOSIS — K900 Celiac disease: Secondary | ICD-10-CM | POA: Diagnosis not present

## 2019-06-27 DIAGNOSIS — N179 Acute kidney failure, unspecified: Secondary | ICD-10-CM | POA: Diagnosis not present

## 2019-06-27 DIAGNOSIS — D631 Anemia in chronic kidney disease: Secondary | ICD-10-CM | POA: Diagnosis not present

## 2019-06-28 ENCOUNTER — Other Ambulatory Visit: Payer: Self-pay | Admitting: Family Medicine

## 2019-06-28 ENCOUNTER — Encounter: Payer: Self-pay | Admitting: Family Medicine

## 2019-06-28 DIAGNOSIS — R0609 Other forms of dyspnea: Secondary | ICD-10-CM

## 2019-06-28 DIAGNOSIS — R0600 Dyspnea, unspecified: Secondary | ICD-10-CM

## 2019-06-28 NOTE — Progress Notes (Signed)
re

## 2019-07-02 ENCOUNTER — Ambulatory Visit (INDEPENDENT_AMBULATORY_CARE_PROVIDER_SITE_OTHER): Payer: PPO | Admitting: Cardiology

## 2019-07-02 ENCOUNTER — Other Ambulatory Visit: Payer: Self-pay | Admitting: Cardiology

## 2019-07-02 ENCOUNTER — Encounter: Payer: Self-pay | Admitting: Cardiology

## 2019-07-02 ENCOUNTER — Ambulatory Visit (HOSPITAL_BASED_OUTPATIENT_CLINIC_OR_DEPARTMENT_OTHER)
Admission: RE | Admit: 2019-07-02 | Discharge: 2019-07-02 | Disposition: A | Discharge status: Home / Self Care | Payer: PPO | Source: Ambulatory Visit | Attending: Family Medicine | Admitting: Family Medicine

## 2019-07-02 ENCOUNTER — Other Ambulatory Visit: Payer: Self-pay

## 2019-07-02 VITALS — BP 138/86 | HR 68 | Ht 62.0 in | Wt 184.0 lb

## 2019-07-02 DIAGNOSIS — R011 Cardiac murmur, unspecified: Secondary | ICD-10-CM | POA: Diagnosis not present

## 2019-07-02 DIAGNOSIS — R0609 Other forms of dyspnea: Secondary | ICD-10-CM

## 2019-07-02 DIAGNOSIS — R0600 Dyspnea, unspecified: Secondary | ICD-10-CM | POA: Insufficient documentation

## 2019-07-02 DIAGNOSIS — I10 Essential (primary) hypertension: Secondary | ICD-10-CM

## 2019-07-02 DIAGNOSIS — R0789 Other chest pain: Secondary | ICD-10-CM | POA: Diagnosis not present

## 2019-07-02 DIAGNOSIS — R0602 Shortness of breath: Secondary | ICD-10-CM | POA: Diagnosis not present

## 2019-07-02 HISTORY — DX: Other forms of dyspnea: R06.09

## 2019-07-02 HISTORY — DX: Cardiac murmur, unspecified: R01.1

## 2019-07-02 NOTE — Progress Notes (Signed)
Cardiology Office Note:    Date:  07/02/2019   ID:  Shannon Bond, DOB 07/26/1940, MRN [id number]  PCP:  Shelda Pal, DO  Cardiologist:  Jenean Lindau, MD   Referring MD: Shelda Pal*    ASSESSMENT:    1. Essential hypertension   2. Chest tightness   3. DOE (dyspnea on exertion)   4. Cardiac murmur    PLAN:    In order of problems listed above:  1. Dyspnea on exertion: I discussed my findings with the patient at length.  She has risk factors for coronary artery disease at this might be an anginal equivalent.  Therefore I will set her up for a Lexiscan sestamibi to rule out for any objective evidence of coronary artery disease.  Also for dyspnea on exertion I would like to do a D-dimer and a BNP on her blood test today. 2. Cardiac murmur: Echocardiogram will be done to assess murmur heard on auscultation 3. Essential hypertension: Blood pressure stable and diet was discussed 4. Overweight diet was discussed and she is very focused on getting her weight to lower than what she is doing.  She is working hard on this and she is also working with the weight loss clinic. 5. Patient will be seen in follow-up appointment in 2 months or earlier if the patient has any concerns    Medication Adjustments/Labs and Tests Ordered: Current medicines are reviewed at length with the patient today.  Concerns regarding medicines are outlined above.  Orders Placed This Encounter  Procedures  . D-Dimer, Quantitative  . B Nat Peptide  . Myocardial Perfusion Imaging  . ECHOCARDIOGRAM COMPLETE   No orders of the defined types were placed in this encounter.    Chief Complaint  Patient presents with  . New Patient (Initial Visit)     History of Present Illness:    Shannon Bond is a 79 y.o. female.  Patient has past medical history of essential hypertension.  She mentions to me that in the past several months she is having dyspnea on exertion.  She also has some  chest tightness at times.  No orthopnea or PND.  For this reason she is referred here.  She is overweight and trying to lose weight at this time.  At the time of my evaluation, the patient is alert awake oriented and in no distress.  She denies any chest pain or radiation to the neck or to the arms.  She walks on a regular basis and she says she can walk about a mile when these problems will start.  She is to walk more in the past.  Past Medical History:  Diagnosis Date  . Alcohol abuse   . Allergy   . Alopecia   . Arthritis   . Back pain   . Celiac disease   . Diverticulitis   . Edema of both lower extremities   . Essential hypertension 01/24/2018  . GERD (gastroesophageal reflux disease)   . Hypertension   . Hyperthyroidism   . Hypothyroidism 01/24/2018  . IBS (irritable bowel syndrome)   . Joint pain   . Obesity (BMI 30-39.9) 04/01/2019  . Osteoarthritis   . Osteoporosis 04/01/2019  . SOB (shortness of breath)   . Thyroid disease     Past Surgical History:  Procedure Laterality Date  . APPENDECTOMY  1954  . [place]  . COLONOSCOPY     2007 said 13 years ago. Michela Pitcher that she does  the hemoocult cards test at home   . ESOPHAGOGASTRODUODENOSCOPY     2014  . REPLACEMENT TOTAL KNEE BILATERAL  2003    Current Medications: Current Meds  Medication Sig  . albuterol (VENTOLIN HFA) 108 (90 Base) MCG/ACT inhaler TAKE 2 PUFFS BY MOUTH EVERY 6 HOURS AS NEEDED FOR WHEEZE OR SHORTNESS OF BREATH  . alendronate (FOSAMAX) 70 MG tablet TAKE 1 TABLET (70 MG TOTAL) BY MOUTH ONCE A WEEK. TAKE WITH A FULL GLASS OF WATER ON AN EMPTY STOMACH.  Marland Kitchen estradiol (ESTRACE) 0.1 MG/GM vaginal cream   . fluticasone (FLONASE) 50 MCG/ACT nasal spray daily.  . hydrochlorothiazide (HYDRODIURIL) 25 MG tablet TAKE 1 TABLET BY MOUTH EVERY DAY  . ipratropium (ATROVENT) 0.06 % nasal spray Place 2 sprays into both nostrils as needed for rhinitis.  Marland Kitchen levothyroxine (SYNTHROID) 100 MCG tablet  Take 1 tablet (100 mcg total) by mouth daily.  Marland Kitchen levothyroxine (SYNTHROID) 88 MCG tablet Take 88 mcg by mouth daily.  Marland Kitchen lisinopril (ZESTRIL) 30 MG tablet TAKE 1 TABLET BY MOUTH EVERY DAY  . Melatonin-Pyridoxine (MELATIN) 3-1 MG TABS Take by mouth.     Allergies:   Chlorine, Latex, Nsaids, and Penicillins   Social History   Socioeconomic History  . Marital status: Married    Spouse name: Wilfored  . Number of children: 3  . Years of education: Not on file  . Highest education level: Not on file  Occupational History  . Occupation: Retired   Tobacco Use  . Smoking status: Never Smoker  . Smokeless tobacco: Never Used  . Tobacco comment: admit to trying to smoke   Substance and Sexual Activity  . Alcohol use: Not Currently    Comment: Patient is a alcoholic  . Drug use: Never  . Sexual activity: Not on file  Other Topics Concern  . Not on file  Social History Narrative  . Not on file   Social Determinants of Health   Financial Resource Strain:   . Difficulty of Paying Living Expenses:   Food Insecurity:   . Worried About Charity fundraiser in the Last Year:   . Arboriculturist in the Last Year:   Transportation Needs:   . Film/video editor (Medical):   Marland Kitchen Lack of Transportation (Non-Medical):   Physical Activity:   . Days of Exercise per Week:   . Minutes of Exercise per Session:   Stress:   . Feeling of Stress :   Social Connections:   . Frequency of Communication with Friends and Family:   . Frequency of Social Gatherings with Friends and Family:   . Attends Religious Services:   . Active Member of Clubs or Organizations:   . Attends Archivist Meetings:   Marland Kitchen Marital Status:      Family History: The patient's family history includes AAA (abdominal aortic aneurysm) in her mother; Alcohol abuse in her father; Cancer in her maternal grandmother; Diabetes in her son; Early death in her father and mother; Hypertension in her mother; Lung cancer in her  father; Mental illness in her maternal grandfather. There is no history of Esophageal cancer, Colon cancer, Rectal cancer, or Stomach cancer.  ROS:   Please see the history of present illness.    All other systems reviewed and are negative.  EKGs/Labs/Other Studies Reviewed:    The following studies were reviewed today: EKG reveals sinus rhythm and nonspecific ST-T changes   Recent Labs: 05/28/2019: ALT 17; BUN 47; Creatinine, Ser 1.64; Hemoglobin  12.9; Platelets 315.0; Potassium 4.5; Sodium 133; TSH 3.19  Recent Lipid Panel    Component Value Date/Time   CHOL 187 09/28/2018 0947   TRIG 179.0 (H) 09/28/2018 0947   HDL 57.50 09/28/2018 0947   CHOLHDL 3 09/28/2018 0947   VLDL 35.8 09/28/2018 0947   LDLCALC 94 09/28/2018 0947    Physical Exam:    VS:  BP 138/86   Pulse 68   Ht 5' 2"  (1.575 m)   Wt 184 lb (83.5 kg)   SpO2 99%   BMI 33.65 kg/m     Wt Readings from Last 3 Encounters:  07/02/19 184 lb (83.5 kg)  06/24/19 183 lb (83 kg)  05/24/19 186 lb (84.4 kg)     GEN: Patient is in no acute distress HEENT: Normal NECK: No JVD; No carotid bruits LYMPHATICS: No lymphadenopathy CARDIAC: Hear sounds regular, 2/6 systolic murmur at the apex. RESPIRATORY:  Clear to auscultation without rales, wheezing or rhonchi  ABDOMEN: Soft, non-tender, non-distended MUSCULOSKELETAL:  No edema; No deformity  SKIN: Warm and dry NEUROLOGIC:  Alert and oriented x 3 PSYCHIATRIC:  Normal affect   Signed, Jenean Lindau, MD  07/02/2019 11:59 AM    [place]

## 2019-07-02 NOTE — Patient Instructions (Signed)
Medication Instructions:  No medication changes *If you need a refill on your cardiac medications before your next appointment, please call your pharmacy*   Lab Work: You had a BNP and D-dimer done today. If you have labs (blood work) drawn today and your tests are completely normal, you will receive your results only by: Marland Kitchen MyChart Message (if you have MyChart) OR . A paper copy in the mail If you have any lab test that is abnormal or we need to change your treatment, we will call you to review the results.   Testing/Procedures: Your physician has requested that you have an echocardiogram. Echocardiography is a painless test that uses sound waves to create images of your heart. It provides your doctor with information about the size and shape of your heart and how well your heart's chambers and valves are working. This procedure takes approximately one hour. There are no restrictions for this procedure.  Your physician has requested that you have a lexiscan myoview. For further information please visit [email or web address]. Please follow instruction sheet, as given.  The test will take approximately 3 to 4 hours to complete; you may bring reading material.  If someone comes with you to your appointment, they will need to remain in the main lobby due to limited space in the testing area. **If you are pregnant or breastfeeding, please notify the nuclear lab prior to your appointment**  How to prepare for your Myocardial Perfusion Test: . Do not eat or drink 3 hours prior to your test, except you may have water. . Do not consume products containing caffeine (regular or decaffeinated) 12 hours prior to your test. (ex: coffee, chocolate, sodas, tea). . Do bring a list of your current medications with you.  If not listed below, you may take your medications as normal. . Do wear comfortable clothes (no dresses or overalls) and walking shoes, tennis shoes preferred (No heels or open toe shoes are  allowed). . Do NOT wear cologne, perfume, aftershave, or lotions (deodorant is allowed). . If these instructions are not followed, your test will have to be rescheduled.    Follow-Up: At [facility], you and your health needs are our priority.  As part of our continuing mission to provide you with exceptional heart care, we have created designated Provider Care Teams.  These Care Teams include your primary Cardiologist (physician) and Advanced Practice Providers (APPs -  Physician Assistants and Nurse Practitioners) who all work together to provide you with the care you need, when you need it.  We recommend signing up for the patient portal called "MyChart".  Sign up information is provided on this After Visit Summary.  MyChart is used to connect with patients for Virtual Visits (Telemedicine).  Patients are able to view lab/test results, encounter notes, upcoming appointments, etc.  Non-urgent messages can be sent to your provider as well.   To learn more about what you can do with MyChart, go to [email or web address].    Your next appointment:   2 month(s)  The format for your next appointment:   In Person  Provider:   Jyl Heinz, MD   Other Instructions NA

## 2019-07-03 ENCOUNTER — Ambulatory Visit (HOSPITAL_BASED_OUTPATIENT_CLINIC_OR_DEPARTMENT_OTHER): Admission: RE | Admit: 2019-07-03 | Payer: PPO | Source: Ambulatory Visit

## 2019-07-03 LAB — D-DIMER, QUANTITATIVE: D-DIMER: 0.5 mg/L FEU — ABNORMAL HIGH (ref 0.00–0.49)

## 2019-07-03 LAB — BRAIN NATRIURETIC PEPTIDE: BNP: 98.8 pg/mL (ref 0.0–100.0)

## 2019-07-03 NOTE — Addendum Note (Signed)
Addended by: Truddie Hidden on: 07/03/2019 04:36 PM   Modules accepted: Orders

## 2019-07-03 NOTE — Addendum Note (Signed)
Addended by: Truddie Hidden on: 07/03/2019 01:37 PM   Modules accepted: Orders

## 2019-07-04 ENCOUNTER — Other Ambulatory Visit: Payer: Self-pay

## 2019-07-04 ENCOUNTER — Ambulatory Visit (HOSPITAL_COMMUNITY)
Admission: RE | Admit: 2019-07-04 | Discharge: 2019-07-04 | Disposition: A | Discharge status: Home / Self Care | Payer: PPO | Source: Ambulatory Visit | Attending: Cardiology | Admitting: Cardiology

## 2019-07-04 ENCOUNTER — Encounter (HOSPITAL_COMMUNITY)
Admission: RE | Admit: 2019-07-04 | Discharge: 2019-07-04 | Disposition: A | Discharge status: Home / Self Care | Payer: PPO | Source: Ambulatory Visit | Attending: Cardiology | Admitting: Cardiology

## 2019-07-04 ENCOUNTER — Other Ambulatory Visit: Payer: Self-pay | Admitting: Nephrology

## 2019-07-04 ENCOUNTER — Other Ambulatory Visit: Payer: Self-pay | Admitting: Cardiology

## 2019-07-04 DIAGNOSIS — R0602 Shortness of breath: Secondary | ICD-10-CM

## 2019-07-04 DIAGNOSIS — R0789 Other chest pain: Secondary | ICD-10-CM

## 2019-07-04 DIAGNOSIS — N179 Acute kidney failure, unspecified: Secondary | ICD-10-CM

## 2019-07-04 DIAGNOSIS — R0600 Dyspnea, unspecified: Secondary | ICD-10-CM

## 2019-07-04 DIAGNOSIS — R0609 Other forms of dyspnea: Secondary | ICD-10-CM

## 2019-07-04 DIAGNOSIS — N183 Chronic kidney disease, stage 3 unspecified: Secondary | ICD-10-CM

## 2019-07-04 DIAGNOSIS — I130 Hypertensive heart and chronic kidney disease with heart failure and stage 1 through stage 4 chronic kidney disease, or unspecified chronic kidney disease: Secondary | ICD-10-CM

## 2019-07-04 MED ORDER — TECHNETIUM TO 99M ALBUMIN AGGREGATED
1.6000 | Freq: Once | INTRAVENOUS | Status: AC | PRN
  Administered 2019-07-04: 1.6 via INTRAVENOUS

## 2019-07-08 ENCOUNTER — Ambulatory Visit (INDEPENDENT_AMBULATORY_CARE_PROVIDER_SITE_OTHER): Payer: PPO | Admitting: Bariatrics

## 2019-07-08 ENCOUNTER — Encounter (INDEPENDENT_AMBULATORY_CARE_PROVIDER_SITE_OTHER): Payer: Self-pay | Admitting: Bariatrics

## 2019-07-08 ENCOUNTER — Ambulatory Visit (HOSPITAL_BASED_OUTPATIENT_CLINIC_OR_DEPARTMENT_OTHER)
Admission: RE | Admit: 2019-07-08 | Discharge: 2019-07-08 | Disposition: A | Discharge status: Home / Self Care | Payer: PPO | Source: Ambulatory Visit | Attending: Cardiology | Admitting: Cardiology

## 2019-07-08 ENCOUNTER — Other Ambulatory Visit: Payer: Self-pay

## 2019-07-08 VITALS — BP 112/67 | HR 79 | Temp 98.0°F | Ht 62.0 in | Wt 178.0 lb

## 2019-07-08 DIAGNOSIS — Z6832 Body mass index (BMI) 32.0-32.9, adult: Secondary | ICD-10-CM

## 2019-07-08 DIAGNOSIS — K5909 Other constipation: Secondary | ICD-10-CM

## 2019-07-08 DIAGNOSIS — E669 Obesity, unspecified: Secondary | ICD-10-CM | POA: Diagnosis not present

## 2019-07-08 DIAGNOSIS — R0600 Dyspnea, unspecified: Secondary | ICD-10-CM | POA: Diagnosis not present

## 2019-07-08 DIAGNOSIS — E039 Hypothyroidism, unspecified: Secondary | ICD-10-CM

## 2019-07-08 DIAGNOSIS — I10 Essential (primary) hypertension: Secondary | ICD-10-CM

## 2019-07-08 NOTE — Progress Notes (Signed)
  Echocardiogram 2D Echocardiogram has been performed.  Cardell Peach 07/08/2019, 11:07 AM

## 2019-07-08 NOTE — Progress Notes (Signed)
Chief Complaint:   OBESITY Shannon Bond is here to discuss her progress with her obesity treatment plan along with follow-up of her obesity related diagnoses. Shannon Bond is on the Category 1 Plan and states she is following her eating plan approximately 97% of the time. Shannon Bond states she is walking 40 minutes 2 times per week.  Today's visit was #: 2 Starting weight: 183 lbs Starting date: 06/24/2019 Today's weight: 178 lbs Today's date: 07/08/2019 Total lbs lost to date: 5 Total lbs lost since last in-office visit: 5  Interim History: Shannon Bond is down 5 lbs from her last visit. She was initially constipated with the plan and started metamucil. She reports not being hungry on the plan.  Subjective:   Essential hypertension. Shannon Bond is taking HCTZ and Zestril. Blood pressure is well controlled.  BP Readings from Last 3 Encounters:  07/08/19 112/67  07/02/19 138/86  06/24/19 128/80   Lab Results  Component Value Date   CREATININE 1.64 (H) 05/28/2019   CREATININE 1.18 05/20/2019   CREATININE 1.27 (H) 04/26/2019   Hypothyroidism, unspecified type. Symptoms are well controlled.   Lab Results  Component Value Date   TSH 3.19 05/28/2019   Other constipation. Shannon Bond is taking metamucil and constipation is improving.  Assessment/Plan:   Essential hypertension. Shannon Bond is working on healthy weight loss and exercise to improve blood pressure control. We will watch for signs of hypotension as she continues her lifestyle modifications. She will continue her medications as directed.  Hypothyroidism, unspecified type. Patient with long-standing hypothyroidism, on levothyroxine therapy. She appears euthyroid. Orders and follow up as documented in patient record. Shannon Bond will continue medications as directed.  Counseling . Good thyroid control is important for overall health. Supratherapeutic thyroid levels are dangerous and will not improve weight loss results. . The correct way to take  levothyroxine is fasting, with water, separated by at least 30 minutes from breakfast, and separated by more than 4 hours from calcium, iron, multivitamins, acid reflux medications (PPIs).   Other constipation. Shannon Bond was informed that a decrease in bowel movement frequency is normal while losing weight, but stools should not be hard or painful. Orders and follow up as documented in patient record. She will take metamucil, Citracal, or MiraLax.  Counseling Getting to Good Bowel Health: Your goal is to have one soft bowel movement each day. Drink at least 8 glasses of water each day. Eat plenty of fiber (goal is over 25 grams each day). It is best to get most of your fiber from dietary sources which includes leafy green vegetables, fresh fruit, and whole grains. You may need to add fiber with the help of OTC fiber supplements. These include Metamucil, Citrucel, and Flaxseed. If you are still having trouble, try adding Miralax or Magnesium Citrate. If all of these changes do not work, Cabin crew.  Class 1 obesity with serious comorbidity and body mass index (BMI) of 32.0 to 32.9 in adult, unspecified obesity type.  Shannon Bond is currently in the action stage of change. As such, her goal is to continue with weight loss efforts. She has agreed to the Category 1 Plan with additional lunch and meal plan dinners.  She will work on meal planning and increasing her water intake.  We independently reviewed with the patients labs including B12, A1c, and insulin.   Exercise goals: Older adults should follow the adult guidelines. When older adults cannot meet the adult guidelines, they should be as physically active as their abilities and conditions  will allow.   Behavioral modification strategies: increasing lean protein intake, decreasing simple carbohydrates, increasing vegetables, increasing water intake, decreasing eating out, no skipping meals, meal planning and cooking strategies, keeping healthy  foods in the home and planning for success.  Shannon Bond has agreed to follow-up with our clinic in 2 weeks. She was informed of the importance of frequent follow-up visits to maximize her success with intensive lifestyle modifications for her multiple health conditions.   Objective:   Blood pressure 112/67, pulse 79, temperature 98 F (36.7 C), height 5' 2"  (1.575 m), weight 178 lb (80.7 kg), SpO2 97 %. Body mass index is 32.56 kg/m.  General: Cooperative, alert, well developed, in no acute distress. HEENT: Conjunctivae and lids unremarkable. Cardiovascular: Regular rhythm.  Lungs: Normal work of breathing. Neurologic: No focal deficits.   Lab Results  Component Value Date   CREATININE 1.64 (H) 05/28/2019   BUN 47 (H) 05/28/2019   NA 133 (L) 05/28/2019   K 4.5 05/28/2019   CL 102 05/28/2019   CO2 23 05/28/2019   Lab Results  Component Value Date   ALT 17 05/28/2019   AST 23 05/28/2019   ALKPHOS 52 05/28/2019   BILITOT 0.4 05/28/2019   Lab Results  Component Value Date   HGBA1C 5.1 06/24/2019   Lab Results  Component Value Date   INSULIN 5.6 06/24/2019   Lab Results  Component Value Date   TSH 3.19 05/28/2019   Lab Results  Component Value Date   CHOL 187 09/28/2018   HDL 57.50 09/28/2018   LDLCALC 94 09/28/2018   TRIG 179.0 (H) 09/28/2018   CHOLHDL 3 09/28/2018   Lab Results  Component Value Date   WBC 9.5 05/28/2019   HGB 12.9 05/28/2019   HCT 38.2 05/28/2019   MCV 96.8 05/28/2019   PLT 315.0 05/28/2019   Lab Results  Component Value Date   IRON 88 05/28/2019   FERRITIN 88.6 05/28/2019   Obesity Behavioral Intervention Documentation for Insurance:   Approximately 15 minutes were spent on the discussion below.  ASK: We discussed the diagnosis of obesity with [name] today and [name] agreed to give [place] permission to discuss obesity behavioral modification therapy today.  ASSESS: [name] has the diagnosis of obesity and her BMI today is 32.7. [name] is  in the action stage of change.   ADVISE: [name] was educated on the multiple health risks of obesity as well as the benefit of weight loss to improve her health. She was advised of the need for long term treatment and the importance of lifestyle modifications to improve her current health and to decrease her risk of future health problems.  AGREE: Multiple dietary modification options and treatment options were discussed and Dearra agreed to follow the recommendations documented in the above note.  ARRANGE: Jerni was educated on the importance of frequent visits to treat obesity as outlined per CMS and USPSTF guidelines and agreed to schedule her next follow up appointment today.  Attestation Statements:   Reviewed by clinician on day of visit: allergies, medications, problem list, medical history, surgical history, family history, social history, and previous encounter notes.  [name], am acting as Location manager for [employer], DO   I have reviewed the above documentation for accuracy and completeness, and I agree with the above. Jearld Lesch, DO

## 2019-07-09 DIAGNOSIS — K219 Gastro-esophageal reflux disease without esophagitis: Secondary | ICD-10-CM | POA: Diagnosis not present

## 2019-07-09 DIAGNOSIS — N2581 Secondary hyperparathyroidism of renal origin: Secondary | ICD-10-CM | POA: Diagnosis not present

## 2019-07-09 DIAGNOSIS — N1832 Chronic kidney disease, stage 3b: Secondary | ICD-10-CM | POA: Diagnosis not present

## 2019-07-09 DIAGNOSIS — K900 Celiac disease: Secondary | ICD-10-CM | POA: Diagnosis not present

## 2019-07-09 DIAGNOSIS — N179 Acute kidney failure, unspecified: Secondary | ICD-10-CM | POA: Diagnosis not present

## 2019-07-11 ENCOUNTER — Encounter (HOSPITAL_COMMUNITY): Payer: PPO

## 2019-07-11 ENCOUNTER — Telehealth (HOSPITAL_COMMUNITY): Payer: Self-pay

## 2019-07-11 ENCOUNTER — Ambulatory Visit
Admission: RE | Admit: 2019-07-11 | Discharge: 2019-07-11 | Disposition: A | Discharge status: Home / Self Care | Payer: PPO | Source: Ambulatory Visit | Attending: Nephrology | Admitting: Nephrology

## 2019-07-11 DIAGNOSIS — N179 Acute kidney failure, unspecified: Secondary | ICD-10-CM

## 2019-07-11 DIAGNOSIS — I130 Hypertensive heart and chronic kidney disease with heart failure and stage 1 through stage 4 chronic kidney disease, or unspecified chronic kidney disease: Secondary | ICD-10-CM

## 2019-07-11 DIAGNOSIS — N183 Chronic kidney disease, stage 3 unspecified: Secondary | ICD-10-CM

## 2019-07-11 NOTE — Telephone Encounter (Signed)
Encounter complete. 

## 2019-07-12 ENCOUNTER — Inpatient Hospital Stay (HOSPITAL_COMMUNITY): Admission: RE | Admit: 2019-07-12 | Payer: PPO | Source: Ambulatory Visit

## 2019-07-16 ENCOUNTER — Other Ambulatory Visit: Payer: Self-pay

## 2019-07-16 ENCOUNTER — Ambulatory Visit (HOSPITAL_COMMUNITY)
Admission: RE | Admit: 2019-07-16 | Discharge: 2019-07-16 | Disposition: A | Discharge status: Home / Self Care | Payer: PPO | Source: Ambulatory Visit | Attending: Cardiovascular Disease | Admitting: Cardiovascular Disease

## 2019-07-16 DIAGNOSIS — R0789 Other chest pain: Secondary | ICD-10-CM | POA: Diagnosis not present

## 2019-07-16 LAB — MYOCARDIAL PERFUSION IMAGING
LV dias vol: 96 mL (ref 46–106)
LV sys vol: 36 mL
Peak HR: 89 {beats}/min
Rest HR: 71 {beats}/min
SDS: 1
SRS: 0
SSS: 1
TID: 1.05

## 2019-07-16 MED ORDER — TECHNETIUM TC 99M TETROFOSMIN IV KIT
28.7000 | PACK | Freq: Once | INTRAVENOUS | Status: AC | PRN
  Administered 2019-07-16: 28.7 via INTRAVENOUS
  Filled 2019-07-16: qty 29

## 2019-07-16 MED ORDER — REGADENOSON 0.4 MG/5ML IV SOLN
0.4000 mg | Freq: Once | INTRAVENOUS | Status: AC
  Administered 2019-07-16: 0.4 mg via INTRAVENOUS

## 2019-07-16 MED ORDER — TECHNETIUM TC 99M TETROFOSMIN IV KIT
10.5000 | PACK | Freq: Once | INTRAVENOUS | Status: AC | PRN
  Administered 2019-07-16: 10.5 via INTRAVENOUS
  Filled 2019-07-16: qty 11

## 2019-07-22 ENCOUNTER — Ambulatory Visit (INDEPENDENT_AMBULATORY_CARE_PROVIDER_SITE_OTHER): Payer: PPO | Admitting: Bariatrics

## 2019-07-22 ENCOUNTER — Other Ambulatory Visit: Payer: Self-pay

## 2019-07-22 ENCOUNTER — Encounter (INDEPENDENT_AMBULATORY_CARE_PROVIDER_SITE_OTHER): Payer: Self-pay | Admitting: Bariatrics

## 2019-07-22 VITALS — BP 152/70 | HR 93 | Temp 97.8°F | Ht 62.0 in | Wt 175.0 lb

## 2019-07-22 DIAGNOSIS — E039 Hypothyroidism, unspecified: Secondary | ICD-10-CM

## 2019-07-22 DIAGNOSIS — E041 Nontoxic single thyroid nodule: Secondary | ICD-10-CM | POA: Insufficient documentation

## 2019-07-22 DIAGNOSIS — J310 Chronic rhinitis: Secondary | ICD-10-CM | POA: Insufficient documentation

## 2019-07-22 DIAGNOSIS — H905 Unspecified sensorineural hearing loss: Secondary | ICD-10-CM | POA: Insufficient documentation

## 2019-07-22 DIAGNOSIS — E669 Obesity, unspecified: Secondary | ICD-10-CM | POA: Diagnosis not present

## 2019-07-22 DIAGNOSIS — I129 Hypertensive chronic kidney disease with stage 1 through stage 4 chronic kidney disease, or unspecified chronic kidney disease: Secondary | ICD-10-CM | POA: Diagnosis not present

## 2019-07-22 DIAGNOSIS — R1310 Dysphagia, unspecified: Secondary | ICD-10-CM | POA: Insufficient documentation

## 2019-07-22 DIAGNOSIS — H9319 Tinnitus, unspecified ear: Secondary | ICD-10-CM

## 2019-07-22 DIAGNOSIS — I10 Essential (primary) hypertension: Secondary | ICD-10-CM

## 2019-07-22 DIAGNOSIS — J370 Chronic laryngitis: Secondary | ICD-10-CM

## 2019-07-22 DIAGNOSIS — K219 Gastro-esophageal reflux disease without esophagitis: Secondary | ICD-10-CM | POA: Insufficient documentation

## 2019-07-22 DIAGNOSIS — Z6832 Body mass index (BMI) 32.0-32.9, adult: Secondary | ICD-10-CM

## 2019-07-22 HISTORY — DX: Unspecified sensorineural hearing loss: H90.5

## 2019-07-22 HISTORY — DX: Nontoxic single thyroid nodule: E04.1

## 2019-07-22 HISTORY — DX: Tinnitus, unspecified ear: H93.19

## 2019-07-22 HISTORY — DX: Gastro-esophageal reflux disease without esophagitis: K21.9

## 2019-07-22 HISTORY — DX: Chronic rhinitis: J31.0

## 2019-07-22 HISTORY — DX: Dysphagia, unspecified: R13.10

## 2019-07-22 HISTORY — DX: Chronic laryngitis: J37.0

## 2019-07-22 NOTE — Progress Notes (Signed)
Chief Complaint:   OBESITY Shannon Bond is here to discuss her progress with her obesity treatment plan along with follow-up of her obesity related diagnoses. Shannon Bond is on the Category 1 Plan and states she is following her eating plan approximately 100% of the time. Shannon Bond states she is walking/yoga/strengthening 4 hours 2 times per week.  Today's visit was #: 3 Starting weight: 183 lbs Starting date: 06/24/2019 Today's weight: 175 lbs Today's date: 07/22/2019 Total lbs lost to date: 8 Total lbs lost since last in-office visit: 3  Interim History: Shannon Bond is down 3 lbs from her last visit and doing well overall.  Subjective:   Hypothyroidism, unspecified type. Shannon Bond is taking Synthroid.  Lab Results  Component Value Date   TSH 3.19 05/28/2019   Essential hypertension. Blood pressure is reasonably well controlled.  BP Readings from Last 3 Encounters:  07/22/19 (!) 152/70  07/08/19 112/67  07/02/19 138/86   Lab Results  Component Value Date   CREATININE 1.64 (H) 05/28/2019   CREATININE 1.18 05/20/2019   CREATININE 1.27 (H) 04/26/2019   Chronic kidney disease with malignant hypertension, stage 1-4 or unspecified chronic kidney disease. Shannon Bond has no protein restrictions at this time.  Assessment/Plan:   Hypothyroidism, unspecified type. Patient with long-standing hypothyroidism, on levothyroxine therapy. She appears euthyroid. Orders and follow up as documented in patient record. Shannon Bond will continue her medication as directed.  Counseling . Good thyroid control is important for overall health. Supratherapeutic thyroid levels are dangerous and will not improve weight loss results. . The correct way to take levothyroxine is fasting, with water, separated by at least 30 minutes from breakfast, and separated by more than 4 hours from calcium, iron, multivitamins, acid reflux medications (PPIs).   Essential hypertension. Shannon Bond is working on healthy weight loss and  exercise to improve blood pressure control. We will watch for signs of hypotension as she continues her lifestyle modifications. She will continue her medications as directed.  Chronic kidney disease with malignant hypertension, stage 1-4 or unspecified chronic kidney disease. Shannon Bond will follow-up with her PCP.  Class 1 obesity with serious comorbidity and body mass index (BMI) of 32.0 to 32.9 in adult, unspecified obesity type.  Shannon Bond is currently in the action stage of change. As such, her goal is to continue with weight loss efforts. She has agreed to the Category 1 Plan.   She will work on meal planning and mindful eating.  We discussed ways to increase metabolism. Homemade Season[employer] handout given.  Exercise goals: Older adults should follow the adult guidelines. When older adults cannot meet the adult guidelines, they should be as physically active as their abilities and conditions will allow. She will continue walking/yoga/strength training.  Behavioral modification strategies: increasing lean protein intake, decreasing simple carbohydrates, increasing vegetables, increasing water intake, decreasing eating out, no skipping meals, meal planning and cooking strategies, keeping healthy foods in the home, ways to avoid boredom eating, ways to avoid night time snacking, better snacking choices, emotional eating strategies and planning for success.  Shannon Bond has agreed to follow-up with our clinic in 2 weeks. She was informed of the importance of frequent follow-up visits to maximize her success with intensive lifestyle modifications for her multiple health conditions.   Objective:   Blood pressure (!) 152/70, pulse 93, temperature 97.8 F (36.6 C), height 5' 2"  (1.575 m), weight 175 lb (79.4 kg), SpO2 98 %. Body mass index is 32.01 kg/m.  General: Cooperative, alert, well developed, in no acute distress. HEENT:  Conjunctivae and lids unremarkable. Cardiovascular: Regular rhythm.  Lungs:  Normal work of breathing. Neurologic: No focal deficits.   Lab Results  Component Value Date   CREATININE 1.64 (H) 05/28/2019   BUN 47 (H) 05/28/2019   NA 133 (L) 05/28/2019   K 4.5 05/28/2019   CL 102 05/28/2019   CO2 23 05/28/2019   Lab Results  Component Value Date   ALT 17 05/28/2019   AST 23 05/28/2019   ALKPHOS 52 05/28/2019   BILITOT 0.4 05/28/2019   Lab Results  Component Value Date   HGBA1C 5.1 06/24/2019   Lab Results  Component Value Date   INSULIN 5.6 06/24/2019   Lab Results  Component Value Date   TSH 3.19 05/28/2019   Lab Results  Component Value Date   CHOL 187 09/28/2018   HDL 57.50 09/28/2018   LDLCALC 94 09/28/2018   TRIG 179.0 (H) 09/28/2018   CHOLHDL 3 09/28/2018   Lab Results  Component Value Date   WBC 9.5 05/28/2019   HGB 12.9 05/28/2019   HCT 38.2 05/28/2019   MCV 96.8 05/28/2019   PLT 315.0 05/28/2019   Lab Results  Component Value Date   IRON 88 05/28/2019   FERRITIN 88.6 05/28/2019   Obesity Behavioral Intervention Documentation for Insurance:   Approximately 15 minutes were spent on the discussion below.  ASK: We discussed the diagnosis of obesity with Shannon Bond today and Shannon Bond agreed to give [place] permission to discuss obesity behavioral modification therapy today.  ASSESS: Shannon Bond has the diagnosis of obesity and her BMI today is 32.1. Shannon Bond is in the action stage of change.   ADVISE: Shannon Bond was educated on the multiple health risks of obesity as well as the benefit of weight loss to improve her health. She was advised of the need for long term treatment and the importance of lifestyle modifications to improve her current health and to decrease her risk of future health problems.  AGREE: Multiple dietary modification options and treatment options were discussed and Shannon Bond agreed to follow the recommendations documented in the above note.  ARRANGE: Shannon Bond was educated on the importance of frequent visits to treat obesity as  outlined per CMS and USPSTF guidelines and agreed to schedule her next follow up appointment today.  Attestation Statements:   Reviewed by clinician on day of visit: allergies, medications, problem list, medical history, surgical history, family history, social history, and previous encounter notes.  Shannon Bond, am acting as Location manager for [employer], DO   I have reviewed the above documentation for accuracy and completeness, and I agree with the above. Shannon Lesch, DO

## 2019-07-25 ENCOUNTER — Other Ambulatory Visit: Payer: Self-pay | Admitting: Family Medicine

## 2019-07-25 DIAGNOSIS — I10 Essential (primary) hypertension: Secondary | ICD-10-CM

## 2019-08-06 ENCOUNTER — Encounter (INDEPENDENT_AMBULATORY_CARE_PROVIDER_SITE_OTHER): Payer: Self-pay | Admitting: Bariatrics

## 2019-08-06 ENCOUNTER — Ambulatory Visit (INDEPENDENT_AMBULATORY_CARE_PROVIDER_SITE_OTHER): Payer: PPO | Admitting: Bariatrics

## 2019-08-06 ENCOUNTER — Other Ambulatory Visit: Payer: Self-pay

## 2019-08-06 VITALS — BP 123/70 | HR 74 | Temp 98.2°F | Ht 62.0 in | Wt 173.0 lb

## 2019-08-06 DIAGNOSIS — I10 Essential (primary) hypertension: Secondary | ICD-10-CM

## 2019-08-06 DIAGNOSIS — E039 Hypothyroidism, unspecified: Secondary | ICD-10-CM | POA: Diagnosis not present

## 2019-08-06 DIAGNOSIS — Z6831 Body mass index (BMI) 31.0-31.9, adult: Secondary | ICD-10-CM

## 2019-08-06 DIAGNOSIS — E669 Obesity, unspecified: Secondary | ICD-10-CM

## 2019-08-07 ENCOUNTER — Encounter (INDEPENDENT_AMBULATORY_CARE_PROVIDER_SITE_OTHER): Payer: Self-pay | Admitting: Bariatrics

## 2019-08-07 NOTE — Progress Notes (Signed)
Chief Complaint:   OBESITY Shannon Bond is here to discuss her progress with her obesity treatment plan along with follow-up of her obesity related diagnoses. Shannon Bond is on the Category 1 Plan and states she is following her eating plan approximately 100% of the time. Shannon Bond states she is walking 45 minutes 7 times per week.  Today's visit was #: 4 Starting weight: 183 lbs Starting date: 06/24/2019 Today's weight: 173 lbs Today's date: 08/06/2019 Total lbs lost to date: 10 Total lbs lost since last in-office visit: 2  Interim History: Shannon Bond is down 2 lbs. She reports adequate water intake and states she found snacking alternatives. She will be traveling to the [place] for 5 days the 3rd week of May and would like travel eating strategies.  Subjective:   Hypothyroidism, unspecified type. Shannon Bond is taking Synthroid. Energy level is adequate.   Lab Results  Component Value Date   TSH 3.19 05/28/2019   Essential hypertension. Shannon Bond is taking HCTZ and lisinopril. Blood pressure is well controlled.  BP Readings from Last 3 Encounters:  08/06/19 123/70  07/22/19 (!) 152/70  07/08/19 112/67   Lab Results  Component Value Date   CREATININE 1.64 (H) 05/28/2019   CREATININE 1.18 05/20/2019   CREATININE 1.27 (H) 04/26/2019   Assessment/Plan:   Hypothyroidism, unspecified type. Patient with long-standing hypothyroidism, on levothyroxine therapy. She appears euthyroid. Orders and follow up as documented in patient record. Shannon Bond will continue her medication as directed.  Counseling . Good thyroid control is important for overall health. Supratherapeutic thyroid levels are dangerous and will not improve weight loss results. . The correct way to take levothyroxine is fasting, with water, separated by at least 30 minutes from breakfast, and separated by more than 4 hours from calcium, iron, multivitamins, acid reflux medications (PPIs).   Essential hypertension. Shannon Bond is  working on healthy weight loss and exercise to improve blood pressure control. We will watch for signs of hypotension as she continues her lifestyle modifications. She will continue her medications as directed.  Class 1 obesity with serious comorbidity and body mass index (BMI) of 31.0 to 31.9 in adult, unspecified obesity type.  Shannon Bond is currently in the action stage of change. As such, her goal is to continue with weight loss efforts. She has agreed to the Category 1 Plan.   She will work on meal planning. Eating Out sheet was given.  Exercise goals: Older adults should follow the adult guidelines. When older adults cannot meet the adult guidelines, they should be as physically active as their abilities and conditions will allow.   Behavioral modification strategies: increasing lean protein intake, decreasing simple carbohydrates, increasing vegetables, increasing water intake, decreasing eating out, no skipping meals, meal planning and cooking strategies, keeping healthy foods in the home, travel eating strategies and planning for success.  Shannon Bond has agreed to follow-up with our clinic in 2 weeks. She was informed of the importance of frequent follow-up visits to maximize her success with intensive lifestyle modifications for her multiple health conditions.   Objective:   Blood pressure 123/70, pulse 74, temperature 98.2 F (36.8 C), height 5' 2"  (1.575 m), weight 173 lb (78.5 kg), SpO2 97 %. Body mass index is 31.64 kg/m.  General: Cooperative, alert, well developed, in no acute distress. HEENT: Conjunctivae and lids unremarkable. Cardiovascular: Regular rhythm.  Lungs: Normal work of breathing. Neurologic: No focal deficits.   Lab Results  Component Value Date   CREATININE 1.64 (H) 05/28/2019   BUN 47 (  H) 05/28/2019   NA 133 (L) 05/28/2019   K 4.5 05/28/2019   CL 102 05/28/2019   CO2 23 05/28/2019   Lab Results  Component Value Date   ALT 17 05/28/2019   AST 23  05/28/2019   ALKPHOS 52 05/28/2019   BILITOT 0.4 05/28/2019   Lab Results  Component Value Date   HGBA1C 5.1 06/24/2019   Lab Results  Component Value Date   INSULIN 5.6 06/24/2019   Lab Results  Component Value Date   TSH 3.19 05/28/2019   Lab Results  Component Value Date   CHOL 187 09/28/2018   HDL 57.50 09/28/2018   LDLCALC 94 09/28/2018   TRIG 179.0 (H) 09/28/2018   CHOLHDL 3 09/28/2018   Lab Results  Component Value Date   WBC 9.5 05/28/2019   HGB 12.9 05/28/2019   HCT 38.2 05/28/2019   MCV 96.8 05/28/2019   PLT 315.0 05/28/2019   Lab Results  Component Value Date   IRON 88 05/28/2019   FERRITIN 88.6 05/28/2019   Obesity Behavioral Intervention Documentation for Insurance:   Approximately 15 minutes were spent on the discussion below.  ASK: We discussed the diagnosis of obesity with Shannon Bond today and Shannon Bond agreed to give [place] permission to discuss obesity behavioral modification therapy today.  ASSESS: [name] has the diagnosis of obesity and her BMI today is 31.7. Shannon Bond is in the action stage of change.   ADVISE: Shannon Bond was educated on the multiple health risks of obesity as well as the benefit of weight loss to improve her health. She was advised of the need for long term treatment and the importance of lifestyle modifications to improve her current health and to decrease her risk of future health problems.  AGREE: Multiple dietary modification options and treatment options were discussed and Shannon Bond agreed to follow the recommendations documented in the above note.  ARRANGE: Shannon Bond was educated on the importance of frequent visits to treat obesity as outlined per CMS and USPSTF guidelines and agreed to schedule her next follow up appointment today.  Attestation Statements:   Reviewed by clinician on day of visit: allergies, medications, problem list, medical history, surgical history, family history, social history, and previous encounter notes.  Shannon Bond, am acting as Location manager for [employer], DO   I have reviewed the above documentation for accuracy and completeness, and I agree with the above. Shannon Lesch, DO

## 2019-08-09 ENCOUNTER — Encounter: Payer: Self-pay | Admitting: Cardiology

## 2019-08-09 DIAGNOSIS — N1832 Chronic kidney disease, stage 3b: Secondary | ICD-10-CM | POA: Diagnosis not present

## 2019-08-09 DIAGNOSIS — N2889 Other specified disorders of kidney and ureter: Secondary | ICD-10-CM | POA: Diagnosis not present

## 2019-08-09 DIAGNOSIS — N179 Acute kidney failure, unspecified: Secondary | ICD-10-CM | POA: Diagnosis not present

## 2019-08-09 DIAGNOSIS — N2581 Secondary hyperparathyroidism of renal origin: Secondary | ICD-10-CM | POA: Diagnosis not present

## 2019-08-09 DIAGNOSIS — K900 Celiac disease: Secondary | ICD-10-CM | POA: Diagnosis not present

## 2019-08-13 ENCOUNTER — Other Ambulatory Visit: Payer: Self-pay | Admitting: Nephrology

## 2019-08-13 DIAGNOSIS — D631 Anemia in chronic kidney disease: Secondary | ICD-10-CM

## 2019-08-13 DIAGNOSIS — N2889 Other specified disorders of kidney and ureter: Secondary | ICD-10-CM

## 2019-08-13 DIAGNOSIS — N189 Chronic kidney disease, unspecified: Secondary | ICD-10-CM

## 2019-08-13 DIAGNOSIS — N2581 Secondary hyperparathyroidism of renal origin: Secondary | ICD-10-CM

## 2019-08-13 DIAGNOSIS — N179 Acute kidney failure, unspecified: Secondary | ICD-10-CM

## 2019-08-13 DIAGNOSIS — N1832 Chronic kidney disease, stage 3b: Secondary | ICD-10-CM

## 2019-08-24 ENCOUNTER — Other Ambulatory Visit: Payer: Self-pay | Admitting: Family Medicine

## 2019-08-27 ENCOUNTER — Other Ambulatory Visit: Payer: Self-pay

## 2019-08-27 ENCOUNTER — Encounter (INDEPENDENT_AMBULATORY_CARE_PROVIDER_SITE_OTHER): Payer: Self-pay | Admitting: Bariatrics

## 2019-08-27 ENCOUNTER — Ambulatory Visit (INDEPENDENT_AMBULATORY_CARE_PROVIDER_SITE_OTHER): Payer: PPO | Admitting: Bariatrics

## 2019-08-27 VITALS — BP 136/83 | HR 82 | Temp 98.3°F | Ht 62.0 in | Wt 168.0 lb

## 2019-08-27 DIAGNOSIS — I10 Essential (primary) hypertension: Secondary | ICD-10-CM | POA: Diagnosis not present

## 2019-08-27 DIAGNOSIS — E038 Other specified hypothyroidism: Secondary | ICD-10-CM | POA: Diagnosis not present

## 2019-08-27 DIAGNOSIS — Z6830 Body mass index (BMI) 30.0-30.9, adult: Secondary | ICD-10-CM | POA: Diagnosis not present

## 2019-08-27 DIAGNOSIS — E669 Obesity, unspecified: Secondary | ICD-10-CM | POA: Diagnosis not present

## 2019-08-27 NOTE — Progress Notes (Signed)
Chief Complaint:   OBESITY Shannon Bond is here to discuss her progress with her obesity treatment plan along with follow-up of her obesity related diagnoses. Shannon Bond is on the Category 1 Plan and states she is following her eating plan approximately 100% of the time. Shannon Bond states she is doing yoga/walking/resistance training 60 minutes 7 times per week.  Today's visit was #: 5 Starting weight: 183 lbs Starting date: 06/24/2019 Today's weight: 168 lbs Today's date: 08/27/2019 Total lbs lost to date: 15 Total lbs lost since last in-office visit: 5  Interim History: Shannon Bond is down 5 lbs from her last visit and has done well overall.  Subjective:   Essential hypertension. Shannon Bond is taking HCTZ. Blood pressure is reasonably well controlled and she reports her blood pressure is good at home with systolic ranging between 841 and 660 and diastolics ranging between 50 and 63.  BP Readings from Last 3 Encounters:  08/27/19 136/83  08/06/19 123/70  07/22/19 (!) 152/70   Lab Results  Component Value Date   CREATININE 1.64 (H) 05/28/2019   CREATININE 1.18 05/20/2019   CREATININE 1.27 (H) 04/26/2019   Other specified hypothyroidism. Shannon Bond is taking Synthroid.   Lab Results  Component Value Date   TSH 3.19 05/28/2019   Assessment/Plan:   Essential hypertension. Shannon Bond is working on healthy weight loss and exercise to improve blood pressure control. We will watch for signs of hypotension as she continues her lifestyle modifications. She will continue her medication as directed.  Other specified hypothyroidism. Patient with long-standing hypothyroidism, on levothyroxine therapy. She appears euthyroid. Orders and follow up as documented in patient record. She will continue her medication as directed.  Counseling . Good thyroid control is important for overall health. Supratherapeutic thyroid levels are dangerous and will not improve weight loss results. . The correct way to take  levothyroxine is fasting, with water, separated by at least 30 minutes from breakfast, and separated by more than 4 hours from calcium, iron, multivitamins, acid reflux medications (PPIs).   Class 1 obesity with serious comorbidity and body mass index (BMI) of 30.0 to 30.9 in adult, unspecified obesity type  Shannon Bond is currently in the action stage of change. As such, her goal is to continue with weight loss efforts. She has agreed to the Category 1 Plan.   She will work on meal planning and intentional eating.  Exercise goals: Shannon Bond will continue her current exercise regimen.  Behavioral modification strategies: increasing lean protein intake, decreasing simple carbohydrates, increasing vegetables, increasing water intake, decreasing eating out, no skipping meals, meal planning and cooking strategies, keeping healthy foods in the home, travel eating strategies and planning for success.  Shannon Bond has agreed to follow-up with our clinic in 2 weeks. She was informed of the importance of frequent follow-up visits to maximize her success with intensive lifestyle modifications for her multiple health conditions.   Objective:   Blood pressure 136/83, pulse 82, temperature 98.3 F (36.8 C), height 5' 2"  (1.575 m), weight 168 lb (76.2 kg), SpO2 99 %. Body mass index is 30.73 kg/m.  General: Cooperative, alert, well developed, in no acute distress. HEENT: Conjunctivae and lids unremarkable. Cardiovascular: Regular rhythm.  Lungs: Normal work of breathing. Neurologic: No focal deficits.   Lab Results  Component Value Date   CREATININE 1.64 (H) 05/28/2019   BUN 47 (H) 05/28/2019   NA 133 (L) 05/28/2019   K 4.5 05/28/2019   CL 102 05/28/2019   CO2 23 05/28/2019   Lab Results  Component Value Date   ALT 17 05/28/2019   AST 23 05/28/2019   ALKPHOS 52 05/28/2019   BILITOT 0.4 05/28/2019   Lab Results  Component Value Date   HGBA1C 5.1 06/24/2019   Lab Results  Component Value Date    INSULIN 5.6 06/24/2019   Lab Results  Component Value Date   TSH 3.19 05/28/2019   Lab Results  Component Value Date   CHOL 187 09/28/2018   HDL 57.50 09/28/2018   LDLCALC 94 09/28/2018   TRIG 179.0 (H) 09/28/2018   CHOLHDL 3 09/28/2018   Lab Results  Component Value Date   WBC 9.5 05/28/2019   HGB 12.9 05/28/2019   HCT 38.2 05/28/2019   MCV 96.8 05/28/2019   PLT 315.0 05/28/2019   Lab Results  Component Value Date   IRON 88 05/28/2019   FERRITIN 88.6 05/28/2019   Obesity Behavioral Intervention Documentation for Insurance:   Approximately 15 minutes were spent on the discussion below.  ASK: We discussed the diagnosis of obesity with Shannon Bond today and Shannon Bond agreed to give [place] permission to discuss obesity behavioral modification therapy today.  ASSESS: Shannon Bond has the diagnosis of obesity and her BMI today is 30.9. Shannon Bond is in the action stage of change.   ADVISE: Shannon Bond was educated on the multiple health risks of obesity as well as the benefit of weight loss to improve her health. She was advised of the need for long term treatment and the importance of lifestyle modifications to improve her current health and to decrease her risk of future health problems.  AGREE: Multiple dietary modification options and treatment options were discussed and Shannon Bond agreed to follow the recommendations documented in the above note.  ARRANGE: Shannon Bond was educated on the importance of frequent visits to treat obesity as outlined per CMS and USPSTF guidelines and agreed to schedule her next follow up appointment today.  Attestation Statements:   Reviewed by clinician on day of visit: allergies, medications, problem list, medical history, surgical history, family history, social history, and previous encounter notes.  [name], am acting as Location manager for [employer], DO   I have reviewed the above documentation for accuracy and completeness, and I agree with the above. Jearld Lesch, DO

## 2019-09-10 ENCOUNTER — Encounter (INDEPENDENT_AMBULATORY_CARE_PROVIDER_SITE_OTHER): Payer: Self-pay | Admitting: Bariatrics

## 2019-09-10 ENCOUNTER — Ambulatory Visit (INDEPENDENT_AMBULATORY_CARE_PROVIDER_SITE_OTHER): Payer: PPO | Admitting: Bariatrics

## 2019-09-10 ENCOUNTER — Other Ambulatory Visit: Payer: Self-pay

## 2019-09-10 VITALS — BP 121/71 | HR 70 | Temp 97.5°F | Ht 62.0 in | Wt 166.0 lb

## 2019-09-10 DIAGNOSIS — I10 Essential (primary) hypertension: Secondary | ICD-10-CM | POA: Diagnosis not present

## 2019-09-10 DIAGNOSIS — Z6830 Body mass index (BMI) 30.0-30.9, adult: Secondary | ICD-10-CM

## 2019-09-10 DIAGNOSIS — E669 Obesity, unspecified: Secondary | ICD-10-CM | POA: Diagnosis not present

## 2019-09-10 DIAGNOSIS — E038 Other specified hypothyroidism: Secondary | ICD-10-CM | POA: Diagnosis not present

## 2019-09-10 NOTE — Progress Notes (Signed)
Chief Complaint:   OBESITY Shannon Bond is here to discuss her progress with her obesity treatment plan along with follow-up of her obesity related diagnoses. Shannon Bond is on the Category 1 Plan and states she is following her eating plan approximately 100% of the time. Shannon Bond states she is walking/yoga/resistance training 60 minutes 7 times per week.  Today's visit was #: 6 Starting weight: 183 lbs Starting date: 06/24/2019 Today's weight: 166 lbs Today's date: 09/10/2019 Total lbs lost to date: 17 Total lbs lost since last in-office visit: 2  Interim History: Shannon Bond is down an additional 2 lbs and doing well overall.  Subjective:   Essential hypertension. Shannon Bond is taking lisinopril and HCTZ. Blood pressure is controlled.  BP Readings from Last 3 Encounters:  09/10/19 121/71  08/27/19 136/83  08/06/19 123/70   Lab Results  Component Value Date   CREATININE 1.64 (H) 05/28/2019   CREATININE 1.18 05/20/2019   CREATININE 1.27 (H) 04/26/2019   Other specified hypothyroidism. Shannon Bond is taking Synthroid.  Lab Results  Component Value Date   TSH 3.19 05/28/2019   Assessment/Plan:   Essential hypertension. Shannon Bond is working on healthy weight loss and exercise to improve blood pressure control. We will watch for signs of hypotension as she continues her lifestyle modifications. She will continue her medications as directed.   Other specified hypothyroidism. Patient with long-standing hypothyroidism, on levothyroxine therapy. She appears euthyroid. Orders and follow up as documented in patient record. Shannon Bond will continue Synthroid as directed.  Counseling . Good thyroid control is important for overall health. Supratherapeutic thyroid levels are dangerous and will not improve weight loss results. . The correct way to take levothyroxine is fasting, with water, separated by at least 30 minutes from breakfast, and separated by more than 4 hours from calcium, iron,  multivitamins, acid reflux medications (PPIs).   Class 1 obesity with serious comorbidity and body mass index (BMI) of 30.0 to 30.9 in adult, unspecified obesity type.  Shannon Bond is currently in the action stage of change. As such, her goal is to continue with weight loss efforts. She has agreed to the Category 1 Plan.   She will work on meal planning and intentional eating.   Exercise goals: Shannon Bond will continue her current exercise regimen.  Behavioral modification strategies: increasing Shannon Bond protein intake, decreasing simple carbohydrates, increasing vegetables, increasing water intake, decreasing eating out, no skipping meals, meal planning and cooking strategies, keeping healthy foods in the home and planning for success.  Shannon Bond has agreed to follow-up with our clinic in 4 weeks. She was informed of the importance of frequent follow-up visits to maximize her success with intensive lifestyle modifications for her multiple health conditions.   Objective:   Blood pressure 121/71, pulse 70, temperature (!) 97.5 F (36.4 C), temperature source Oral, height 5' 2"  (1.575 m), weight 166 lb (75.3 kg), SpO2 100 %. Body mass index is 30.36 kg/m.  General: Cooperative, alert, well developed, in no acute distress. HEENT: Conjunctivae and lids unremarkable. Cardiovascular: Regular rhythm.  Lungs: Normal work of breathing. Neurologic: No focal deficits.   Lab Results  Component Value Date   CREATININE 1.64 (H) 05/28/2019   BUN 47 (H) 05/28/2019   NA 133 (L) 05/28/2019   K 4.5 05/28/2019   CL 102 05/28/2019   CO2 23 05/28/2019   Lab Results  Component Value Date   ALT 17 05/28/2019   AST 23 05/28/2019   ALKPHOS 52 05/28/2019   BILITOT 0.4 05/28/2019   Lab Results  Component Value Date   HGBA1C 5.1 06/24/2019   Lab Results  Component Value Date   INSULIN 5.6 06/24/2019   Lab Results  Component Value Date   TSH 3.19 05/28/2019   Lab Results  Component Value Date   CHOL 187  09/28/2018   HDL 57.50 09/28/2018   LDLCALC 94 09/28/2018   TRIG 179.0 (H) 09/28/2018   CHOLHDL 3 09/28/2018   Lab Results  Component Value Date   WBC 9.5 05/28/2019   HGB 12.9 05/28/2019   HCT 38.2 05/28/2019   MCV 96.8 05/28/2019   PLT 315.0 05/28/2019   Lab Results  Component Value Date   IRON 88 05/28/2019   FERRITIN 88.6 05/28/2019   Obesity Behavioral Intervention Documentation for Insurance:   Approximately 15 minutes were spent on the discussion below.  ASK: We discussed the diagnosis of obesity with Shannon Bond today and Shannon Bond agreed to give [place] permission to discuss obesity behavioral modification therapy today.  ASSESS: Shannon Bond has the diagnosis of obesity and her BMI today is 30.4. Shannon Bond is in the action stage of change.   ADVISE: Shannon Bond was educated on the multiple health risks of obesity as well as the benefit of weight loss to improve her health. She was advised of the need for long term treatment and the importance of lifestyle modifications to improve her current health and to decrease her risk of future health problems.  AGREE: Multiple dietary modification options and treatment options were discussed and Shannon Bond agreed to follow the recommendations documented in the above note.  ARRANGE: Shannon Bond was educated on the importance of frequent visits to treat obesity as outlined per CMS and USPSTF guidelines and agreed to schedule her next follow up appointment today.  Attestation Statements:   Reviewed by clinician on day of visit: allergies, medications, problem list, medical history, surgical history, family history, social history, and previous encounter notes.  Shannon Bond, am acting as Location manager for [employer], DO   I have reviewed the above documentation for accuracy and completeness, and I agree with the above. Jearld Lesch, DO

## 2019-09-12 ENCOUNTER — Ambulatory Visit: Payer: PPO | Admitting: Cardiology

## 2019-09-17 ENCOUNTER — Ambulatory Visit: Payer: PPO | Admitting: Cardiology

## 2019-09-18 ENCOUNTER — Other Ambulatory Visit: Payer: Self-pay | Admitting: Family Medicine

## 2019-09-19 ENCOUNTER — Encounter: Payer: Self-pay | Admitting: Family Medicine

## 2019-09-19 ENCOUNTER — Telehealth: Payer: Self-pay | Admitting: Family Medicine

## 2019-09-19 DIAGNOSIS — H26492 Other secondary cataract, left eye: Secondary | ICD-10-CM | POA: Diagnosis not present

## 2019-09-19 DIAGNOSIS — H02831 Dermatochalasis of right upper eyelid: Secondary | ICD-10-CM | POA: Diagnosis not present

## 2019-09-19 DIAGNOSIS — Z961 Presence of intraocular lens: Secondary | ICD-10-CM | POA: Diagnosis not present

## 2019-09-19 DIAGNOSIS — H43813 Vitreous degeneration, bilateral: Secondary | ICD-10-CM | POA: Diagnosis not present

## 2019-09-19 NOTE — Telephone Encounter (Signed)
Dedrick from [employer]. States she came into their office with Eye trouble. Provider that states patients needs  A carotid ultrasound and a vascular risk factor work up. Provider at [facility] stated it would be easier for her to have this order thru her PCP. Please advise. Dedrick can be reached directly at [facility].

## 2019-09-20 ENCOUNTER — Telehealth (INDEPENDENT_AMBULATORY_CARE_PROVIDER_SITE_OTHER): Payer: PPO | Admitting: Family Medicine

## 2019-09-20 ENCOUNTER — Encounter: Payer: Self-pay | Admitting: Family Medicine

## 2019-09-20 ENCOUNTER — Other Ambulatory Visit: Payer: Self-pay

## 2019-09-20 DIAGNOSIS — H5461 Unqualified visual loss, right eye, normal vision left eye: Secondary | ICD-10-CM

## 2019-09-20 NOTE — Progress Notes (Signed)
CC: vision loss  Subjective: Patient is a 79 y.o. female here for vision loss. Due to COVID-19 pandemic, we are interacting via phone. I verified patient's ID using 2 identifiers. Patient agreed to proceed with visit via this method. Patient is at home, I am at office. Patient and I are present for visit.   2 nights ago, the patient had sudden vision loss in her right eye for around 5 minutes.  She is watching TV when it happened.  She did not have any other neurologic signs or symptoms such as balance issues, weakness, numbness, tingling, trouble speaking, or difficulty swallowing.  She is currently asymptomatic.  She saw her eye provider yesterday who recommended she follow-up with [place].  She does have a family history of fibromuscular dysplasia with her daughter.  She does not carry this diagnosis.  She had a normal echo at the end of March of this year with exception of aortic regurgitation.  She does follow with the cardiology team.  Past Medical History:  Diagnosis Date  . Alcohol abuse   . Allergy   . Alopecia   . Arthritis   . Back pain   . Celiac disease   . Diverticulitis   . Edema of both lower extremities   . Essential hypertension 01/24/2018  . GERD (gastroesophageal reflux disease)   . Hypertension   . Hyperthyroidism   . Hypothyroidism 01/24/2018  . IBS (irritable bowel syndrome)   . Joint pain   . Obesity (BMI 30-39.9) 04/01/2019  . Osteoarthritis   . Osteoporosis 04/01/2019  . SOB (shortness of breath)   . Thyroid disease     Objective: No conversational dyspnea Age appropriate judgment and insight Nml affect and mood  Assessment and Plan: Vision loss of right eye - Plan: US Carotid Duplex Bilateral   We will hold off on ordering echo as she just had a normal one done.  Check carotid ultrasound as above.  Duplex should be able to evaluate fibromuscular dysplasia.  If unremarkable, will consider MRI of the head.  If symptoms return, she is instructed to go to the  emergency department.  Continue managing blood pressure and cardiac risk factors as previous. The patient voiced understanding and agreement to the plan.  Greater than 15 minutes were spent reviewing the patient's chart/ophthalmology note and discussing her care on the phone with her.  [place], DO 09/20/19  10:36 AM

## 2019-09-20 NOTE — Telephone Encounter (Signed)
Called Shannon Bond and gave them our fax number [phone] to fax over the OV notes.

## 2019-09-20 NOTE — Telephone Encounter (Signed)
Can we get some office notes on this? Ty.

## 2019-10-01 ENCOUNTER — Ambulatory Visit (HOSPITAL_BASED_OUTPATIENT_CLINIC_OR_DEPARTMENT_OTHER)
Admission: RE | Admit: 2019-10-01 | Discharge: 2019-10-01 | Disposition: A | Discharge status: Home / Self Care | Payer: PPO | Source: Ambulatory Visit | Attending: Family Medicine | Admitting: Family Medicine

## 2019-10-01 ENCOUNTER — Other Ambulatory Visit: Payer: Self-pay

## 2019-10-01 DIAGNOSIS — H5461 Unqualified visual loss, right eye, normal vision left eye: Secondary | ICD-10-CM | POA: Diagnosis not present

## 2019-10-01 DIAGNOSIS — I6523 Occlusion and stenosis of bilateral carotid arteries: Secondary | ICD-10-CM | POA: Diagnosis not present

## 2019-10-02 ENCOUNTER — Encounter: Payer: Self-pay | Admitting: Family Medicine

## 2019-10-02 ENCOUNTER — Ambulatory Visit (INDEPENDENT_AMBULATORY_CARE_PROVIDER_SITE_OTHER): Payer: PPO | Admitting: Family Medicine

## 2019-10-02 VITALS — BP 122/74 | HR 71 | Temp 95.7°F | Ht 62.5 in | Wt 166.4 lb

## 2019-10-02 DIAGNOSIS — H5461 Unqualified visual loss, right eye, normal vision left eye: Secondary | ICD-10-CM | POA: Diagnosis not present

## 2019-10-02 DIAGNOSIS — E039 Hypothyroidism, unspecified: Secondary | ICD-10-CM | POA: Diagnosis not present

## 2019-10-02 DIAGNOSIS — M720 Palmar fascial fibromatosis [Dupuytren]: Secondary | ICD-10-CM | POA: Diagnosis not present

## 2019-10-02 DIAGNOSIS — Z0000 Encounter for general adult medical examination without abnormal findings: Secondary | ICD-10-CM

## 2019-10-02 DIAGNOSIS — Z1159 Encounter for screening for other viral diseases: Secondary | ICD-10-CM

## 2019-10-02 MED ORDER — LEVOTHYROXINE SODIUM 125 MCG PO TABS: 125.0000 ug | ORAL_TABLET | Freq: Every day | ORAL | 3 refills | Status: DC

## 2019-10-02 MED ORDER — ROSUVASTATIN CALCIUM 20 MG PO TABS: 20.0000 mg | ORAL_TABLET | Freq: Every day | ORAL | 3 refills | Status: DC

## 2019-10-02 NOTE — Progress Notes (Addendum)
Chief Complaint  Patient presents with  . Follow-up     Well Woman Shannon Bond is here for a complete physical.   Her last physical was >1 year ago.  Current diet: in general, a "healthy" diet. Current exercise: Cardio, yoga, wt resistance exercise. Weight is decreasing and she denies daytime fatigue. Seatbelt? Yes  Health Maintenance Shingrix- Yes DEXA- Yes Tetanus- Yes Pneumonia- Yes Hep C screen- No  Past Medical History:  Diagnosis Date  . Alcohol abuse   . Allergy   . Alopecia   . Arthritis   . Back pain   . Celiac disease   . Diverticulitis   . Edema of both lower extremities   . Essential hypertension 01/24/2018  . GERD (gastroesophageal reflux disease)   . Hypertension   . Hyperthyroidism   . Hypothyroidism 01/24/2018  . IBS (irritable bowel syndrome)   . Joint pain   . Obesity (BMI 30-39.9) 04/01/2019  . Osteoarthritis   . Osteoporosis 04/01/2019  . SOB (shortness of breath)   . Thyroid disease      Past Surgical History:  Procedure Laterality Date  . APPENDECTOMY  1954  . [place]  . COLONOSCOPY     2007 said 13 years ago. 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 ESOPHAGOGASTRODUODENOSCOPY     2014  . REPLACEMENT TOTAL KNEE BILATERAL  2003    Medications  Current Outpatient Medications on File Prior to Visit  Medication Sig Dispense Refill  . acetaminophen (TYLENOL) 325 MG tablet Take 650 mg by mouth every 6 (six) hours as needed.    Marland Kitchen alendronate (FOSAMAX) 70 MG tablet TAKE 1 TABLET (70 MG TOTAL) BY MOUTH ONCE A WEEK. TAKE WITH A FULL GLASS OF WATER ON AN EMPTY STOMACH. 12 tablet 2  . estradiol (ESTRACE) 0.1 MG/GM vaginal cream     . fluticasone (FLONASE) 50 MCG/ACT nasal spray daily.    . hydrochlorothiazide (HYDRODIURIL) 25 MG tablet TAKE 1 TABLET BY MOUTH EVERY DAY 90 tablet 1  . ipratropium (ATROVENT) 0.06 % nasal spray Place 2 sprays into both nostrils as needed for rhinitis. 45 mL 3  .  lactobacillus acidophilus (BACID) TABS tablet Take 2 tablets by mouth daily.    Marland Kitchen levothyroxine (SYNTHROID) 100 MCG tablet TAKE 1 TABLET BY MOUTH EVERY DAY 90 tablet 1  . lisinopril (ZESTRIL) 30 MG tablet Take 1 tablet (30 mg total) by mouth daily. 90 tablet 1  . Melatonin-Pyridoxine (MELATIN) 3-1 MG TABS Take by mouth.    . Multiple Vitamin (MULTIVITAMIN WITH MINERALS) TABS tablet Take 1 tablet by mouth daily.    Marland Kitchen PAPAYA ENZYME PO Take by mouth.    . S-Adenosylmethionine (SAM-E) 400 MG TABS Take by mouth 2 (two) times daily.      Allergies Allergies  Allergen Reactions  . Chlorine   . Latex   . Nsaids     Said it upset her stomach really bad   . Penicillins     Review of Systems: Constitutional:  no fevers Eye:  no recent significant change in vision Ears:  No changes in hearing Nose/Mouth/Throat:  no complaints of nasal congestion, no sore throat Cardiovascular: no chest pain Respiratory:  No shortness of breath Gastrointestinal:  No change in bowel habits GU:  Female: negative for dysuria Integumentary:  no abnormal skin lesions reported Neurologic:  no headaches Endocrine:  denies unexplained weight changes  Exam BP 122/74 (BP Location: Left Arm, Patient Position:  Sitting, Cuff Size: Normal)   Pulse 71   Temp (!) 95.7 F (35.4 C) (Temporal)   Ht 5' 2.5" (1.588 m)   Wt 166 lb 6 oz (75.5 kg)   SpO2 98%   BMI 29.95 kg/m  General:  well developed, well nourished, in no apparent distress Skin:  no significant moles, warts, or growths Head:  no masses, lesions, or tenderness Eyes:  pupils equal and round, sclera anicteric without injection Ears:  canals without lesions, TMs shiny without retraction, no obvious effusion, no erythema Nose:  nares patent, septum midline, mucosa normal, and no drainage or sinus tenderness Throat/Pharynx:  lips and gingiva without lesion; tongue and uvula midline; non-inflamed pharynx; no exudates or postnasal drainage Neck: neck supple  without adenopathy, thyromegaly, or masses Lungs:  clear to auscultation, breath sounds equal bilaterally, no respiratory distress Cardio:  regular rate and rhythm, no bruits or LE edema Abdomen:  abdomen soft, nontender; bowel sounds normal; no masses or organomegaly Genital: Deferred Neuro:  gait normal; deep tendon reflexes normal and symmetric MSK: +contractures of 2nd and 5th flexor tendons on R hand and 3rd flexor tendon on the L; no erythema or sig TTP Psych: well oriented with normal range of affect and appropriate judgment/insight  Assessment and Plan  Well adult exam - Plan: CBC, Comprehensive metabolic panel, Lipid panel, TSH  Vision loss of right eye - Plan: MR Brain Wo Contrast  Encounter for hepatitis C screening test for low risk patient - Plan: Hepatitis C antibody  Dupuytren contracture - Plan: Ambulatory referral to Sports Medicine  Hypothyroidism, unspecified type - Plan: TSH   Well 79 y.o. female. Counseled on diet and exercise. Other orders as above. Will return for fasting labs.  Ck MRI, she does have metal in knees so may have to do CT. Refer to sports med for hands. Looks like contractures to me, it is not triggering.  Follow up in 6 mo pending above. The patient voiced understanding and agreement to the plan.  [place], DO 10/02/19 12:16 PM

## 2019-10-02 NOTE — Patient Instructions (Signed)
Give [place] 2-3 business days to get the results of your labs back.   Keep the diet clean and stay active.  If you do not hear anything about your referral in the next 1-2 weeks, call our office and ask for an update.  Let us know if you need anything.

## 2019-10-03 LAB — HEPATITIS C ANTIBODY
Hepatitis C Ab: NONREACTIVE
SIGNAL TO CUT-OFF: 0.01 (ref <1.00)

## 2019-10-08 ENCOUNTER — Ambulatory Visit (INDEPENDENT_AMBULATORY_CARE_PROVIDER_SITE_OTHER): Payer: PPO | Admitting: Bariatrics

## 2019-10-08 ENCOUNTER — Encounter (INDEPENDENT_AMBULATORY_CARE_PROVIDER_SITE_OTHER): Payer: Self-pay | Admitting: Bariatrics

## 2019-10-08 ENCOUNTER — Other Ambulatory Visit: Payer: Self-pay

## 2019-10-08 VITALS — BP 145/86 | HR 79 | Temp 98.1°F | Ht 62.0 in | Wt 161.0 lb

## 2019-10-08 DIAGNOSIS — I10 Essential (primary) hypertension: Secondary | ICD-10-CM | POA: Diagnosis not present

## 2019-10-08 DIAGNOSIS — E669 Obesity, unspecified: Secondary | ICD-10-CM | POA: Diagnosis not present

## 2019-10-08 DIAGNOSIS — E038 Other specified hypothyroidism: Secondary | ICD-10-CM | POA: Diagnosis not present

## 2019-10-08 DIAGNOSIS — Z6830 Body mass index (BMI) 30.0-30.9, adult: Secondary | ICD-10-CM | POA: Diagnosis not present

## 2019-10-09 ENCOUNTER — Encounter (INDEPENDENT_AMBULATORY_CARE_PROVIDER_SITE_OTHER): Payer: Self-pay | Admitting: Bariatrics

## 2019-10-09 NOTE — Progress Notes (Signed)
Chief Complaint:   OBESITY Shannon Bond is here to discuss her progress with her obesity treatment plan along with follow-up of her obesity related diagnoses. Shannon Bond is on the Category 1 Plan and states she is following her eating plan approximately 100% of the time. [name] states she is doing yoga/walking/resistance training 60 minutes 7 times per week.  Today's visit was #: 7 Starting weight: 183 Starting date: 06/24/2019 Today's weight: 161 lbs Today's date: 10/08/2019 Total lbs lost to date: 22 Total lbs lost since last in-office visit: 5  Interim History: Shannon Bond is down an additional 5 lbs and doing very well overall. She does not bring "bad food" in the house.  Subjective:   Other specified hypothyroidism. Shannon Bond is taking Synthroid.   Lab Results  Component Value Date   TSH 3.19 05/28/2019   Essential hypertension. Shannon Bond is taking HCTZ and lisinopril. Blood pressure is reasonably well controlled.  BP Readings from Last 3 Encounters:  10/08/19 (!) 145/86  10/02/19 122/74  09/10/19 121/71   Lab Results  Component Value Date   CREATININE 1.64 (H) 05/28/2019   CREATININE 1.18 05/20/2019   CREATININE 1.27 (H) 04/26/2019   Assessment/Plan:   Other specified hypothyroidism. Patient with long-standing hypothyroidism, on levothyroxine therapy. She appears euthyroid. Orders and follow up as documented in patient record. Catharina will continue Synthroid as directed.  Counseling . Good thyroid control is important for overall health. Supratherapeutic thyroid levels are dangerous and will not improve weight loss results. . The correct way to take levothyroxine is fasting, with water, separated by at least 30 minutes from breakfast, and separated by more than 4 hours from calcium, iron, multivitamins, acid reflux medications (PPIs).   Essential hypertension. Ashantae is working on healthy weight loss and exercise to improve blood pressure control. We will watch for signs of  hypotension as she continues her lifestyle modifications. [name] will continue her medications as directed.  Class 1 obesity with serious comorbidity and body mass index (BMI) of 30.0 to 30.9 in adult, unspecified obesity type - BMI greater than 30 at start.  Shannon Bond is currently in the action stage of change. As such, her goal is to continue with weight loss efforts. She has agreed to the Category 1 Plan.   She will work on meal planning and intentional eating.   Exercise goals: Older adults should follow the adult guidelines. When older adults cannot meet the adult guidelines, they should be as physically active as their abilities and conditions will allow.   Behavioral modification strategies: increasing lean protein intake, decreasing simple carbohydrates, increasing vegetables, increasing water intake, decreasing eating out, no skipping meals, meal planning and cooking strategies, keeping healthy foods in the home and planning for success.  Shannon Bond has agreed to follow-up with our clinic in 2 weeks. She was informed of the importance of frequent follow-up visits to maximize her success with intensive lifestyle modifications for her multiple health conditions.   Objective:   Blood pressure (!) 145/86, pulse 79, temperature 98.1 F (36.7 C), height 5' 2"  (1.575 m), weight 161 lb (73 kg), SpO2 100 %. Body mass index is 29.45 kg/m.  General: Cooperative, alert, well developed, in no acute distress. HEENT: Conjunctivae and lids unremarkable. Cardiovascular: Regular rhythm.  Lungs: Normal work of breathing. Neurologic: No focal deficits.   Lab Results  Component Value Date   CREATININE 1.64 (H) 05/28/2019   BUN 47 (H) 05/28/2019   NA 133 (L) 05/28/2019   K 4.5 05/28/2019   CL  102 05/28/2019   CO2 23 05/28/2019   Lab Results  Component Value Date   ALT 17 05/28/2019   AST 23 05/28/2019   ALKPHOS 52 05/28/2019   BILITOT 0.4 05/28/2019   Lab Results  Component Value Date    HGBA1C 5.1 06/24/2019   Lab Results  Component Value Date   INSULIN 5.6 06/24/2019   Lab Results  Component Value Date   TSH 3.19 05/28/2019   Lab Results  Component Value Date   CHOL 187 09/28/2018   HDL 57.50 09/28/2018   LDLCALC 94 09/28/2018   TRIG 179.0 (H) 09/28/2018   CHOLHDL 3 09/28/2018   Lab Results  Component Value Date   WBC 9.5 05/28/2019   HGB 12.9 05/28/2019   HCT 38.2 05/28/2019   MCV 96.8 05/28/2019   PLT 315.0 05/28/2019   Lab Results  Component Value Date   IRON 88 05/28/2019   FERRITIN 88.6 05/28/2019   Obesity Behavioral Intervention Documentation for Insurance:   Approximately 15 minutes were spent on the discussion below.  ASK: We discussed the diagnosis of obesity with Shannon Bond today and Shannon Bond agreed to give [place] permission to discuss obesity behavioral modification therapy today.  ASSESS: [name] has the diagnosis of obesity and her BMI today is 29.6. [name] is in the action stage of change.   ADVISE: Shannon Bond was educated on the multiple health risks of obesity as well as the benefit of weight loss to improve her health. She was advised of the need for long term treatment and the importance of lifestyle modifications to improve her current health and to decrease her risk of future health problems.  AGREE: Multiple dietary modification options and treatment options were discussed and Shannon Bond agreed to follow the recommendations documented in the above note.  ARRANGE: Shannon Bond was educated on the importance of frequent visits to treat obesity as outlined per CMS and USPSTF guidelines and agreed to schedule her next follow up appointment today.  Attestation Statements:   Reviewed by clinician on day of visit: allergies, medications, problem list, medical history, surgical history, family history, social history, and previous encounter notes.  Shannon Bond, am acting as Location manager for [employer], DO   I have reviewed the above  documentation for accuracy and completeness, and I agree with the above. Shannon Lesch, DO

## 2019-10-11 ENCOUNTER — Other Ambulatory Visit: Payer: Self-pay

## 2019-10-11 ENCOUNTER — Other Ambulatory Visit (INDEPENDENT_AMBULATORY_CARE_PROVIDER_SITE_OTHER): Payer: PPO

## 2019-10-11 ENCOUNTER — Ambulatory Visit: Payer: PPO | Admitting: Cardiology

## 2019-10-11 ENCOUNTER — Encounter: Payer: Self-pay | Admitting: Cardiology

## 2019-10-11 VITALS — BP 118/70 | HR 80 | Ht 62.0 in | Wt 164.0 lb

## 2019-10-11 DIAGNOSIS — R0609 Other forms of dyspnea: Secondary | ICD-10-CM

## 2019-10-11 DIAGNOSIS — I10 Essential (primary) hypertension: Secondary | ICD-10-CM

## 2019-10-11 DIAGNOSIS — I351 Nonrheumatic aortic (valve) insufficiency: Secondary | ICD-10-CM

## 2019-10-11 DIAGNOSIS — R0600 Dyspnea, unspecified: Secondary | ICD-10-CM | POA: Diagnosis not present

## 2019-10-11 DIAGNOSIS — R011 Cardiac murmur, unspecified: Secondary | ICD-10-CM | POA: Diagnosis not present

## 2019-10-11 DIAGNOSIS — R7989 Other specified abnormal findings of blood chemistry: Secondary | ICD-10-CM

## 2019-10-11 DIAGNOSIS — Z0000 Encounter for general adult medical examination without abnormal findings: Secondary | ICD-10-CM

## 2019-10-11 HISTORY — DX: Nonrheumatic aortic (valve) insufficiency: I35.1

## 2019-10-11 LAB — LIPID PANEL
Cholesterol: 120 mg/dL (ref 0–200)
HDL: 44.7 mg/dL (ref >39.00)
LDL Cholesterol: 53 mg/dL (ref 0–99)
NonHDL: 75.34
Total CHOL/HDL Ratio: 3
Triglycerides: 113 mg/dL (ref 0.0–149.0)
VLDL: 22.6 mg/dL (ref 0.0–40.0)

## 2019-10-11 LAB — COMPREHENSIVE METABOLIC PANEL
ALT: 11 U/L (ref 0–35)
AST: 17 U/L (ref 0–37)
Albumin: 4.5 g/dL (ref 3.5–5.2)
Alkaline Phosphatase: 43 U/L (ref 39–117)
BUN: 53 mg/dL — ABNORMAL HIGH (ref 6–23)
CO2: 22 mEq/L (ref 19–32)
Calcium: 10 mg/dL (ref 8.4–10.5)
Chloride: 105 mEq/L (ref 96–112)
Creatinine, Ser: 1.26 mg/dL — ABNORMAL HIGH (ref 0.40–1.20)
GFR: 41.05 mL/min — ABNORMAL LOW (ref >60.00)
Glucose, Bld: 94 mg/dL (ref 70–99)
Potassium: 4.5 mEq/L (ref 3.5–5.1)
Sodium: 138 mEq/L (ref 135–145)
Total Bilirubin: 0.7 mg/dL (ref 0.2–1.2)
Total Protein: 6.8 g/dL (ref 6.0–8.3)

## 2019-10-11 LAB — CBC
HCT: 36.5 % (ref 36.0–46.0)
Hemoglobin: 12.4 g/dL (ref 12.0–15.0)
MCHC: 34 g/dL (ref 30.0–36.0)
MCV: 97.5 fl (ref 78.0–100.0)
Platelets: 269 10*3/uL (ref 150.0–400.0)
RBC: 3.75 Mil/uL — ABNORMAL LOW (ref 3.87–5.11)
RDW: 13 % (ref 11.5–15.5)
WBC: 5.5 10*3/uL (ref 4.0–10.5)

## 2019-10-11 LAB — TSH: TSH: 0.15 u[IU]/mL — ABNORMAL LOW (ref 0.35–4.50)

## 2019-10-11 LAB — T4, FREE: Free T4: 1.66 ng/dL — ABNORMAL HIGH (ref 0.60–1.60)

## 2019-10-11 NOTE — Patient Instructions (Signed)

## 2019-10-11 NOTE — Progress Notes (Signed)
Cardiology Office Note:    Date:  10/11/2019   ID:  Shannon Bond, DOB [birth year], MRN [id number]  PCP:  Shelda Pal, DO  Cardiologist:  Jenean Lindau, MD   Referring MD: Shelda Pal*    ASSESSMENT:    1. Essential hypertension   2. Cardiac murmur   3. DOE (dyspnea on exertion)   4. Moderate aortic regurgitation    PLAN:    In order of problems listed above:  1. Primary prevention stressed with the patient.  Importance of compliance with diet medication stressed and she vocalized understanding. 2. Essential hypertension: Blood pressure is stable and she will continue current medications 3. Moderate aortic regurgitation: Stable at this time.  Echocardiogram report was reviewed with her questions were answered to her satisfaction. 4. Patient will be seen in follow-up appointment in 6 months or earlier if the patient has any concerns    Medication Adjustments/Labs and Tests Ordered: Current medicines are reviewed at length with the patient today.  Concerns regarding medicines are outlined above.  No orders of the defined types were placed in this encounter.  No orders of the defined types were placed in this encounter.    No chief complaint on file.    History of Present Illness:    Shannon Bond is a 79 y.o. female.  Patient has past medical history of essential hypertension, moderate aortic regurgitation.  She denies any problems at this time and takes care of activities of daily living.  She is done a phenomenal job with exercise and dieting for losing weight and she is very happy about it.  She had blood work with her primary care this morning.  She is awaiting results.  At the time of my evaluation, the patient is alert awake oriented and in no distress.  Past Medical History:  Diagnosis Date  . Alcohol abuse   . Allergy   . Alopecia   . Arthritis   . Back pain   . Celiac disease   . Diverticulitis   . Edema of both lower extremities    . Essential hypertension 01/24/2018  . GERD (gastroesophageal reflux disease)   . Hypertension   . Hyperthyroidism   . Hypothyroidism 01/24/2018  . IBS (irritable bowel syndrome)   . Joint pain   . Obesity (BMI 30-39.9) 04/01/2019  . Osteoarthritis   . Osteoporosis 04/01/2019  . SOB (shortness of breath)   . Thyroid disease     Past Surgical History:  Procedure Laterality Date  . APPENDECTOMY  1954  . [place]  . COLONOSCOPY     2007 said 13 years ago. 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 ESOPHAGOGASTRODUODENOSCOPY     2014  . REPLACEMENT TOTAL KNEE BILATERAL  2003    Current Medications: Current Meds  Medication Sig  . acetaminophen (TYLENOL) 325 MG tablet Take 650 mg by mouth every 6 (six) hours as needed.  Marland Kitchen alendronate (FOSAMAX) 70 MG tablet TAKE 1 TABLET (70 MG TOTAL) BY MOUTH ONCE A WEEK. TAKE WITH A FULL GLASS OF WATER ON AN EMPTY STOMACH.  Marland Kitchen estradiol (ESTRACE) 0.1 MG/GM vaginal cream   . fluticasone (FLONASE) 50 MCG/ACT nasal spray daily.  . hydrochlorothiazide (HYDRODIURIL) 25 MG tablet TAKE 1 TABLET BY MOUTH EVERY DAY  . ipratropium (ATROVENT) 0.06 % nasal spray Place 2 sprays into both nostrils as needed for rhinitis.  Marland Kitchen lactobacillus acidophilus (BACID) TABS tablet Take 2 tablets by  mouth daily.  Marland Kitchen levothyroxine (SYNTHROID) 125 MCG tablet Take 1 tablet (125 mcg total) by mouth daily.  Marland Kitchen lisinopril (ZESTRIL) 30 MG tablet Take 1 tablet (30 mg total) by mouth daily.  . Melatonin-Pyridoxine (MELATIN) 3-1 MG TABS Take by mouth.  . Multiple Vitamin (MULTIVITAMIN WITH MINERALS) TABS tablet Take 1 tablet by mouth daily.  Marland Kitchen PAPAYA ENZYME PO Take by mouth.  . rosuvastatin (CRESTOR) 20 MG tablet Take 1 tablet (20 mg total) by mouth daily.  . S-Adenosylmethionine (SAM-E) 400 MG TABS Take by mouth 2 (two) times daily.     Allergies:   Chlorine, Latex, Nsaids, and Penicillins   Social History   Socioeconomic History  .  Marital status: Married    Spouse name: Wilfored  . Number of children: 3  . Years of education: Not on file  . Highest education level: Not on file  Occupational History  . Occupation: Retired   Tobacco Use  . Smoking status: Never Smoker  . Smokeless tobacco: Never Used  . Tobacco comment: admit to trying to smoke   Vaping Use  . Vaping Use: Never used  Substance and Sexual Activity  . Alcohol use: Not Currently    Comment: Patient is a alcoholic  . Drug use: Never  . Sexual activity: Not on file  Other Topics Concern  . Not on file  Social History Narrative  . Not on file   Social Determinants of Health   Financial Resource Strain:   . Difficulty of Paying Living Expenses:   Food Insecurity:   . Worried About Charity fundraiser in the Last Year:   . Arboriculturist in the Last Year:   Transportation Needs:   . Film/video editor (Medical):   Marland Kitchen Lack of Transportation (Non-Medical):   Physical Activity:   . Days of Exercise per Week:   . Minutes of Exercise per Session:   Stress:   . Feeling of Stress :   Social Connections:   . Frequency of Communication with Friends and Family:   . Frequency of Social Gatherings with Friends and Family:   . Attends Religious Services:   . Active Member of Clubs or Organizations:   . Attends Archivist Meetings:   Marland Kitchen Marital Status:      Family History: The patient's family history includes AAA (abdominal aortic aneurysm) in her mother; Alcohol abuse in her father; Cancer in her maternal grandmother; Diabetes in her son; Early death in her father and mother; Hypertension in her mother; Lung cancer in her father; Mental illness in her maternal grandfather. There is no history of Esophageal cancer, Colon cancer, Rectal cancer, or Stomach cancer.  ROS:   Please see the history of present illness.    All other systems reviewed and are negative.  EKGs/Labs/Other Studies Reviewed:    The following studies were  reviewed today: IMPRESSIONS    1. Left ventricular ejection fraction, by estimation, is 60 to 65%. The  left ventricle has normal function. The left ventricle has no regional  wall motion abnormalities. Left ventricular diastolic parameters are  consistent with Grade I diastolic  dysfunction (impaired relaxation).  2. Right ventricular systolic function is normal. The right ventricular  size is normal. There is normal pulmonary artery systolic pressure.  3. The mitral valve is normal in structure. Trivial mitral valve  regurgitation. No evidence of mitral stenosis.  4. The aortic valve is normal in structure. Aortic valve regurgitation is  moderate. No aortic stenosis  is present.  5. There is mild dilatation of the ascending aorta measuring 37 mm.  6. The inferior vena cava is normal in size with greater than 50%  respiratory variability, suggesting right atrial pressure of 3 mmHg.   Study Highlights   The left ventricular ejection fraction is normal (55-65%).  Nuclear stress EF: 62%.  There was no ST segment deviation noted during stress.  The study is normal.  This is a low risk study.  No prior study for comparison.    Recent Labs: 05/28/2019: ALT 17; BUN 47; Creatinine, Ser 1.64; Hemoglobin 12.9; Platelets 315.0; Potassium 4.5; Sodium 133; TSH 3.19 07/02/2019: BNP 98.8  Recent Lipid Panel    Component Value Date/Time   CHOL 187 09/28/2018 0947   TRIG 179.0 (H) 09/28/2018 0947   HDL 57.50 09/28/2018 0947   CHOLHDL 3 09/28/2018 0947   VLDL 35.8 09/28/2018 0947   LDLCALC 94 09/28/2018 0947    Physical Exam:    VS:  BP 118/70   Pulse 80   Ht 5' 2"  (1.575 m)   Wt 164 lb (74.4 kg)   SpO2 97%   BMI 30.00 kg/m     Wt Readings from Last 3 Encounters:  10/11/19 164 lb (74.4 kg)  10/08/19 161 lb (73 kg)  10/02/19 166 lb 6 oz (75.5 kg)     GEN: Patient is in no acute distress HEENT: Normal NECK: No JVD; No carotid bruits LYMPHATICS: No  lymphadenopathy CARDIAC: Hear sounds regular, 2/6 systolic murmur at the apex. RESPIRATORY:  Clear to auscultation without rales, wheezing or rhonchi  ABDOMEN: Soft, non-tender, non-distended MUSCULOSKELETAL:  No edema; No deformity  SKIN: Warm and dry NEUROLOGIC:  Alert and oriented x 3 PSYCHIATRIC:  Normal affect   Signed, Jenean Lindau, MD  10/11/2019 2:04 PM    [place]

## 2019-10-12 ENCOUNTER — Other Ambulatory Visit: Payer: Self-pay | Admitting: Family Medicine

## 2019-10-12 MED ORDER — LEVOTHYROXINE SODIUM 112 MCG PO TABS: 112.0000 ug | ORAL_TABLET | Freq: Every day | ORAL | 3 refills | Status: DC

## 2019-10-14 ENCOUNTER — Other Ambulatory Visit: Payer: Self-pay | Admitting: Family Medicine

## 2019-10-14 DIAGNOSIS — E039 Hypothyroidism, unspecified: Secondary | ICD-10-CM

## 2019-10-17 ENCOUNTER — Ambulatory Visit (HOSPITAL_COMMUNITY)
Admission: RE | Admit: 2019-10-17 | Discharge: 2019-10-17 | Disposition: A | Discharge status: Home / Self Care | Payer: PPO | Source: Ambulatory Visit | Attending: Family Medicine | Admitting: Family Medicine

## 2019-10-17 ENCOUNTER — Encounter (INDEPENDENT_AMBULATORY_CARE_PROVIDER_SITE_OTHER): Payer: Self-pay

## 2019-10-17 ENCOUNTER — Other Ambulatory Visit: Payer: Self-pay

## 2019-10-17 DIAGNOSIS — R9082 White matter disease, unspecified: Secondary | ICD-10-CM | POA: Diagnosis not present

## 2019-10-17 DIAGNOSIS — H5461 Unqualified visual loss, right eye, normal vision left eye: Secondary | ICD-10-CM | POA: Diagnosis not present

## 2019-10-22 ENCOUNTER — Encounter (INDEPENDENT_AMBULATORY_CARE_PROVIDER_SITE_OTHER): Payer: Self-pay | Admitting: Bariatrics

## 2019-10-22 ENCOUNTER — Ambulatory Visit (INDEPENDENT_AMBULATORY_CARE_PROVIDER_SITE_OTHER): Payer: PPO | Admitting: Bariatrics

## 2019-10-22 ENCOUNTER — Other Ambulatory Visit: Payer: Self-pay

## 2019-10-22 VITALS — BP 124/75 | HR 83 | Temp 98.4°F | Ht 62.0 in | Wt 158.0 lb

## 2019-10-22 DIAGNOSIS — I10 Essential (primary) hypertension: Secondary | ICD-10-CM | POA: Diagnosis not present

## 2019-10-22 DIAGNOSIS — I351 Nonrheumatic aortic (valve) insufficiency: Secondary | ICD-10-CM

## 2019-10-22 DIAGNOSIS — E669 Obesity, unspecified: Secondary | ICD-10-CM | POA: Diagnosis not present

## 2019-10-22 DIAGNOSIS — Z6830 Body mass index (BMI) 30.0-30.9, adult: Secondary | ICD-10-CM | POA: Diagnosis not present

## 2019-10-23 ENCOUNTER — Encounter (INDEPENDENT_AMBULATORY_CARE_PROVIDER_SITE_OTHER): Payer: Self-pay | Admitting: Bariatrics

## 2019-10-23 NOTE — Progress Notes (Signed)
Chief Complaint:   OBESITY Shannon Bond is here to discuss her progress with her obesity treatment plan along with follow-up of her obesity related diagnoses. Shannon Bond is on the Category 1 Plan and states she is following her eating plan approximately 99% of the time. Shannon Bond states she is walking/cardio/yoga 60 minutes 7 times per week.  Today's visit was #: 8 Starting weight: 183 lbs Starting date: 06/24/2019 Today's weight: 158 lbs Today's date: 10/22/2019 Total lbs lost to date: 25 Total lbs lost since last in-office visit: 3  Interim History: Shannon Bond is down an additional 3 lbs and has done well overall.  Subjective:   Moderate aortic regurgitation. Shannon Bond saw Dr. Jyl Heinz, cardiologist. She has a history of cardiac murmur on 10/11/2019.  Essential hypertension. Shannon Bond is taking HCTZ. Blood pressure is well controlled.  BP Readings from Last 3 Encounters:  10/22/19 124/75  10/11/19 118/70  10/08/19 (!) 145/86   Lab Results  Component Value Date   CREATININE 1.26 (H) 10/11/2019   CREATININE 1.64 (H) 05/28/2019   CREATININE 1.18 05/20/2019   Assessment/Plan:   Moderate aortic regurgitation. Shannon Bond will continue to follow-up with her cardiologist as directed and as scheduled.  Essential hypertension. Shannon Bond is working on healthy weight loss and exercise to improve blood pressure control. We will watch for signs of hypotension as she continues her lifestyle modifications. She will continue her medication as directed.   Class 1 obesity with serious comorbidity and body mass index (BMI) of 30.0 to 30.9 in adult, unspecified obesity type - BMI greater than 30 at start.  Shannon Bond is currently in the action stage of change. As such, her goal is to continue with weight loss efforts. She has agreed to the Category 1 Plan.   She will work on meal planning and intentional eating.   Exercise goals: Shannon Bond will continue walking/cardio/yoga 60 minutes 7 times per  week.  Behavioral modification strategies: increasing lean protein intake, decreasing simple carbohydrates, increasing vegetables, increasing water intake, decreasing eating out, no skipping meals, meal planning and cooking strategies, keeping healthy foods in the home and planning for success.  Shannon Bond has agreed to follow-up with our clinic in 2 weeks. She was informed of the importance of frequent follow-up visits to maximize her success with intensive lifestyle modifications for her multiple health conditions.   Objective:   Blood pressure 124/75, pulse 83, temperature 98.4 F (36.9 C), height 5' 2"  (1.575 m), weight 158 lb (71.7 kg), SpO2 96 %. Body mass index is 28.9 kg/m.  General: Cooperative, alert, well developed, in no acute distress. HEENT: Conjunctivae and lids unremarkable. Cardiovascular: Regular rhythm.  Lungs: Normal work of breathing. Neurologic: No focal deficits.   Lab Results  Component Value Date   CREATININE 1.26 (H) 10/11/2019   BUN 53 (H) 10/11/2019   NA 138 10/11/2019   K 4.5 10/11/2019   CL 105 10/11/2019   CO2 22 10/11/2019   Lab Results  Component Value Date   ALT 11 10/11/2019   AST 17 10/11/2019   ALKPHOS 43 10/11/2019   BILITOT 0.7 10/11/2019   Lab Results  Component Value Date   HGBA1C 5.1 06/24/2019   Lab Results  Component Value Date   INSULIN 5.6 06/24/2019   Lab Results  Component Value Date   TSH 0.15 (L) 10/11/2019   Lab Results  Component Value Date   CHOL 120 10/11/2019   HDL 44.70 10/11/2019   LDLCALC 53 10/11/2019   TRIG 113.0 10/11/2019   CHOLHDL  3 10/11/2019   Lab Results  Component Value Date   WBC 5.5 10/11/2019   HGB 12.4 10/11/2019   HCT 36.5 10/11/2019   MCV 97.5 10/11/2019   PLT 269.0 10/11/2019   Lab Results  Component Value Date   IRON 88 05/28/2019   FERRITIN 88.6 05/28/2019   Obesity Behavioral Intervention Documentation for Insurance:   Approximately 15 minutes were spent on the discussion  below.  ASK: We discussed the diagnosis of obesity with Shannon Bond today and Shannon Bond agreed to give [place] permission to discuss obesity behavioral modification therapy today.  ASSESS: Shannon Bond has the diagnosis of obesity and her BMI today is 29.0. Shannon Bond is in the action stage of change.   ADVISE: Shannon Bond was educated on the multiple health risks of obesity as well as the benefit of weight loss to improve her health. She was advised of the need for long term treatment and the importance of lifestyle modifications to improve her current health and to decrease her risk of future health problems.  AGREE: Multiple dietary modification options and treatment options were discussed and Shannon Bond agreed to follow the recommendations documented in the above note.  ARRANGE: Shannon Bond was educated on the importance of frequent visits to treat obesity as outlined per CMS and USPSTF guidelines and agreed to schedule her next follow up appointment today.  Attestation Statements:   Reviewed by clinician on day of visit: allergies, medications, problem list, medical history, surgical history, family history, social history, and previous encounter notes.  [name], am acting as Location manager for [employer], DO   I have reviewed the above documentation for accuracy and completeness, and I agree with the above. Jearld Lesch, DO

## 2019-11-06 ENCOUNTER — Other Ambulatory Visit: Payer: Self-pay

## 2019-11-06 ENCOUNTER — Encounter (INDEPENDENT_AMBULATORY_CARE_PROVIDER_SITE_OTHER): Payer: Self-pay | Admitting: Bariatrics

## 2019-11-06 ENCOUNTER — Ambulatory Visit (INDEPENDENT_AMBULATORY_CARE_PROVIDER_SITE_OTHER): Payer: PPO | Admitting: Bariatrics

## 2019-11-06 VITALS — BP 137/82 | HR 70 | Temp 97.8°F | Ht 62.0 in | Wt 158.0 lb

## 2019-11-06 DIAGNOSIS — Z6830 Body mass index (BMI) 30.0-30.9, adult: Secondary | ICD-10-CM

## 2019-11-06 DIAGNOSIS — I10 Essential (primary) hypertension: Secondary | ICD-10-CM

## 2019-11-06 DIAGNOSIS — E038 Other specified hypothyroidism: Secondary | ICD-10-CM | POA: Diagnosis not present

## 2019-11-06 DIAGNOSIS — E669 Obesity, unspecified: Secondary | ICD-10-CM

## 2019-11-07 ENCOUNTER — Encounter (INDEPENDENT_AMBULATORY_CARE_PROVIDER_SITE_OTHER): Payer: Self-pay | Admitting: Bariatrics

## 2019-11-07 NOTE — Progress Notes (Signed)
Chief Complaint:   OBESITY Shannon Bond is here to discuss her progress with her obesity treatment plan along with follow-up of her obesity related diagnoses. Shannon Bond is on the Category 1 Plan and states she is following her eating plan approximately 100% of the time. Shannon Bond states she is walking/cardio/yoga 60 minutes 7 times per week.  Today's visit was #: 9 Starting weight: 183 lbs Starting date: 06/24/2019 Today's weight: 158 lbs Today's date: 11/06/2019 Total lbs lost to date: 25 Total lbs lost since last in-office visit: 0  Interim History: Shannon Bond's weight remains the same, but she has done well overall. She reports doing good with her water intake.  Subjective:   Other specified hypothyroidism. Shannon Bond is taking Synthroid.   Lab Results  Component Value Date   TSH 0.15 (L) 10/11/2019   Essential hypertension. Shannon Bond is taking HCTZ and Zestril. Blood pressure is controlled.  BP Readings from Last 3 Encounters:  11/06/19 137/82  10/22/19 124/75  10/11/19 118/70   Lab Results  Component Value Date   CREATININE 1.26 (H) 10/11/2019   CREATININE 1.64 (H) 05/28/2019   CREATININE 1.18 05/20/2019   Assessment/Plan:   Other specified hypothyroidism. Patient with long-standing hypothyroidism, on levothyroxine therapy. She appears euthyroid. Orders and follow up as documented in patient record. Shannon Bond will continue Synthroid as directed.   Counseling . Good thyroid control is important for overall health. Supratherapeutic thyroid levels are dangerous and will not improve weight loss results. . The correct way to take levothyroxine is fasting, with water, separated by at least 30 minutes from breakfast, and separated by more than 4 hours from calcium, iron, multivitamins, acid reflux medications (PPIs).   Essential hypertension. Shannon Bond is working on healthy weight loss and exercise to improve blood pressure control. We will watch for signs of hypotension as she continues  her lifestyle modifications. She will continue her medications as directed.   Class 1 obesity with serious comorbidity and body mass index (BMI) of 30.0 to 30.9 in adult, unspecified obesity type - BMI greater than 30 at start.  Shannon Bond is currently in the action stage of change. As such, her goal is to continue with weight loss efforts. She has agreed to the Category 1 Plan.   She will work on meal planning, intentional eating, weigh her meat, and increase her protein in the morning.  Exercise goals: Shannon Bond will continue walking/cardio/yoga as above.  Behavioral modification strategies: increasing lean protein intake, decreasing simple carbohydrates, increasing vegetables, increasing water intake, decreasing eating out, no skipping meals, meal planning and cooking strategies, keeping healthy foods in the home and planning for success.  Shannon Bond has agreed to follow-up with our clinic in 2 weeks. She was informed of the importance of frequent follow-up visits to maximize her success with intensive lifestyle modifications for her multiple health conditions.   Objective:   Blood pressure 137/82, pulse 70, temperature 97.8 F (36.6 C), height 5' 2"  (1.575 m), weight 158 lb (71.7 kg), SpO2 99 %. Body mass index is 28.9 kg/m.  General: Cooperative, alert, well developed, in no acute distress. HEENT: Conjunctivae and lids unremarkable. Cardiovascular: Regular rhythm.  Lungs: Normal work of breathing. Neurologic: No focal deficits.   Lab Results  Component Value Date   CREATININE 1.26 (H) 10/11/2019   BUN 53 (H) 10/11/2019   NA 138 10/11/2019   K 4.5 10/11/2019   CL 105 10/11/2019   CO2 22 10/11/2019   Lab Results  Component Value Date   ALT 11 10/11/2019  AST 17 10/11/2019   ALKPHOS 43 10/11/2019   BILITOT 0.7 10/11/2019   Lab Results  Component Value Date   HGBA1C 5.1 06/24/2019   Lab Results  Component Value Date   INSULIN 5.6 06/24/2019   Lab Results  Component Value  Date   TSH 0.15 (L) 10/11/2019   Lab Results  Component Value Date   CHOL 120 10/11/2019   HDL 44.70 10/11/2019   LDLCALC 53 10/11/2019   TRIG 113.0 10/11/2019   CHOLHDL 3 10/11/2019   Lab Results  Component Value Date   WBC 5.5 10/11/2019   HGB 12.4 10/11/2019   HCT 36.5 10/11/2019   MCV 97.5 10/11/2019   PLT 269.0 10/11/2019   Lab Results  Component Value Date   IRON 88 05/28/2019   FERRITIN 88.6 05/28/2019   Obesity Behavioral Intervention Documentation for Insurance:   Approximately 15 minutes were spent on the discussion below.  ASK: We discussed the diagnosis of obesity with Shannon Bond today and Shannon Bond agreed to give [place] permission to discuss obesity behavioral modification therapy today.  ASSESS: Shannon Bond has the diagnosis of obesity and her BMI today is 29.0. Shannon Bond is in the action stage of change.   ADVISE: Shannon Bond was educated on the multiple health risks of obesity as well as the benefit of weight loss to improve her health. She was advised of the need for long term treatment and the importance of lifestyle modifications to improve her current health and to decrease her risk of future health problems.  AGREE: Multiple dietary modification options and treatment options were discussed and Shannon Bond agreed to follow the recommendations documented in the above note.  ARRANGE: Shannon Bond was educated on the importance of frequent visits to treat obesity as outlined per CMS and USPSTF guidelines and agreed to schedule her next follow up appointment today.  Attestation Statements:   Reviewed by clinician on day of visit: allergies, medications, problem list, medical history, surgical history, family history, social history, and previous encounter notes.  [name], am acting as Location manager for [employer], DO   I have reviewed the above documentation for accuracy and completeness, and I agree with the above. Jearld Lesch, DO

## 2019-11-11 DIAGNOSIS — M546 Pain in thoracic spine: Secondary | ICD-10-CM | POA: Diagnosis not present

## 2019-11-11 DIAGNOSIS — M545 Low back pain: Secondary | ICD-10-CM | POA: Diagnosis not present

## 2019-11-11 DIAGNOSIS — M542 Cervicalgia: Secondary | ICD-10-CM | POA: Diagnosis not present

## 2019-11-12 ENCOUNTER — Other Ambulatory Visit: Payer: Self-pay

## 2019-11-15 DIAGNOSIS — M47812 Spondylosis without myelopathy or radiculopathy, cervical region: Secondary | ICD-10-CM | POA: Diagnosis not present

## 2019-11-21 DIAGNOSIS — H26492 Other secondary cataract, left eye: Secondary | ICD-10-CM | POA: Diagnosis not present

## 2019-11-21 DIAGNOSIS — Z961 Presence of intraocular lens: Secondary | ICD-10-CM | POA: Diagnosis not present

## 2019-11-21 DIAGNOSIS — H43813 Vitreous degeneration, bilateral: Secondary | ICD-10-CM | POA: Diagnosis not present

## 2019-11-21 DIAGNOSIS — G453 Amaurosis fugax: Secondary | ICD-10-CM | POA: Diagnosis not present

## 2019-11-25 ENCOUNTER — Encounter (INDEPENDENT_AMBULATORY_CARE_PROVIDER_SITE_OTHER): Payer: Self-pay | Admitting: Bariatrics

## 2019-11-25 ENCOUNTER — Other Ambulatory Visit: Payer: Self-pay

## 2019-11-25 ENCOUNTER — Other Ambulatory Visit (INDEPENDENT_AMBULATORY_CARE_PROVIDER_SITE_OTHER): Payer: PPO

## 2019-11-25 ENCOUNTER — Ambulatory Visit (INDEPENDENT_AMBULATORY_CARE_PROVIDER_SITE_OTHER): Payer: PPO | Admitting: Bariatrics

## 2019-11-25 VITALS — BP 119/78 | HR 75 | Temp 97.8°F | Ht 62.0 in | Wt 155.0 lb

## 2019-11-25 DIAGNOSIS — I10 Essential (primary) hypertension: Secondary | ICD-10-CM

## 2019-11-25 DIAGNOSIS — Z6830 Body mass index (BMI) 30.0-30.9, adult: Secondary | ICD-10-CM

## 2019-11-25 DIAGNOSIS — R0602 Shortness of breath: Secondary | ICD-10-CM

## 2019-11-25 DIAGNOSIS — E669 Obesity, unspecified: Secondary | ICD-10-CM

## 2019-11-25 DIAGNOSIS — E038 Other specified hypothyroidism: Secondary | ICD-10-CM | POA: Diagnosis not present

## 2019-11-25 DIAGNOSIS — E039 Hypothyroidism, unspecified: Secondary | ICD-10-CM

## 2019-11-25 DIAGNOSIS — R5383 Other fatigue: Secondary | ICD-10-CM

## 2019-11-25 LAB — T4: T4, Total: 11.1 ug/dL (ref 5.1–11.9)

## 2019-11-25 LAB — TSH: TSH: 0.17 u[IU]/mL — ABNORMAL LOW (ref 0.35–4.50)

## 2019-11-26 ENCOUNTER — Encounter (INDEPENDENT_AMBULATORY_CARE_PROVIDER_SITE_OTHER): Payer: Self-pay | Admitting: Bariatrics

## 2019-11-26 DIAGNOSIS — M47812 Spondylosis without myelopathy or radiculopathy, cervical region: Secondary | ICD-10-CM | POA: Diagnosis not present

## 2019-11-26 NOTE — Progress Notes (Signed)
Chief Complaint:   OBESITY Maxine Fredman is here to discuss her progress with her obesity treatment plan along with follow-up of her obesity related diagnoses. Jackelynn is on the Category 1 Plan and states she is following her eating plan approximately 100% of the time. Jaquayla states she is walking/resistance/yoga 60 minutes 7 times per week.  Today's visit was #: 10 Starting weight: 183 lbs Starting date: 06/24/2019 Today's weight: 155 lbs Today's date: 11/25/2019 Total lbs lost to date: 28 Total lbs lost since last in-office visit: 3  Interim History: Denecia is down 3 lbs. She is down a total of 28 lbs.   Subjective:   Other specified hypothyroidism. Sandar is taking Synthroid.   Lab Results  Component Value Date   TSH 0.17 (L) 11/25/2019   Essential hypertension. Blood pressure is controlled.  BP Readings from Last 3 Encounters:  11/25/19 119/78  11/06/19 137/82  10/22/19 124/75   Lab Results  Component Value Date   CREATININE 1.26 (H) 10/11/2019   CREATININE 1.64 (H) 05/28/2019   CREATININE 1.18 05/20/2019   Other fatigue. RMR was previously 712; RMR 754 today.  SOB (shortness of breath) on exertion. Dalexa notes increasing shortness of breath with exercising and seems to be worsening over time with weight gain. She notes getting out of breath sooner with activity than she used to. Lacinda denies shortness of breath at rest or orthopnea.  Assessment/Plan:   Other specified hypothyroidism. Patient with long-standing hypothyroidism, on levothyroxine therapy. She appears euthyroid. Orders and follow up as documented in patient record. Myrella will continue Synthroid as directed.   Counseling . Good thyroid control is important for overall health. Supratherapeutic thyroid levels are dangerous and will not improve weight loss results.  . The correct way to take levothyroxine is fasting, with water, separated by at least 30 minutes from breakfast, and separated by more  than 4 hours from calcium, iron, multivitamins, acid reflux medications (PPIs).   Essential hypertension. Marin is working on healthy weight loss and exercise to improve blood pressure control. We will watch for signs of hypotension as she continues her lifestyle modifications. She will continue her medication as directed.   Other fatigue. Fatigue may be related to obesity, depression or many other causes. Labs will be ordered, and in the meanwhile, Yolunda will focus on self care including making healthy food choices, increasing physical activity and focusing on stress reduction. IC was performed today.  SOB (shortness of breath) on exertion. Aliese's shortness of breath appears to be obesity related and exercise induced. She has agreed to work on weight loss and gradually increase exercise to treat her exercise induced shortness of breath. Will continue to monitor closely. IC performed.  Class 1 obesity with serious comorbidity and body mass index (BMI) of 30.0 to 30.9 in adult, unspecified obesity type - BMI greater than 30 at start.  Amaya is currently in the action stage of change. As such, her goal is to continue with weight loss efforts. She has agreed to the Category 1 Plan.   She will work on meal planning and continuing to follow meal plan as directed. She will continue exercise as above, at least 5-6 days a week.  Exercise goals: Jeydi will continue walking/resistance/yoga 60 minutes 7 days per week.  Behavioral modification strategies: increasing lean protein intake, decreasing simple carbohydrates, increasing vegetables, increasing water intake, decreasing eating out, no skipping meals, meal planning and cooking strategies, keeping healthy foods in the home and planning for success.  Yesha has agreed to follow-up with our clinic in 2-3 weeks. She was informed of the importance of frequent follow-up visits to maximize her success with intensive lifestyle modifications for her  multiple health conditions.   Objective:   Blood pressure 119/78, pulse 75, temperature 97.8 F (36.6 C), height 5' 2"  (1.575 m), weight 155 lb (70.3 kg), SpO2 99 %. Body mass index is 28.35 kg/m.  General: Cooperative, alert, well developed, in no acute distress. HEENT: Conjunctivae and lids unremarkable. Cardiovascular: Regular rhythm.  Lungs: Normal work of breathing. Neurologic: No focal deficits.   Lab Results  Component Value Date   CREATININE 1.26 (H) 10/11/2019   BUN 53 (H) 10/11/2019   NA 138 10/11/2019   K 4.5 10/11/2019   CL 105 10/11/2019   CO2 22 10/11/2019   Lab Results  Component Value Date   ALT 11 10/11/2019   AST 17 10/11/2019   ALKPHOS 43 10/11/2019   BILITOT 0.7 10/11/2019   Lab Results  Component Value Date   HGBA1C 5.1 06/24/2019   Lab Results  Component Value Date   INSULIN 5.6 06/24/2019   Lab Results  Component Value Date   TSH 0.17 (L) 11/25/2019   Lab Results  Component Value Date   CHOL 120 10/11/2019   HDL 44.70 10/11/2019   LDLCALC 53 10/11/2019   TRIG 113.0 10/11/2019   CHOLHDL 3 10/11/2019   Lab Results  Component Value Date   WBC 5.5 10/11/2019   HGB 12.4 10/11/2019   HCT 36.5 10/11/2019   MCV 97.5 10/11/2019   PLT 269.0 10/11/2019   Lab Results  Component Value Date   IRON 88 05/28/2019   FERRITIN 88.6 05/28/2019   Obesity Behavioral Intervention Documentation for Insurance:   Approximately 15 minutes were spent on the discussion below.  ASK: We discussed the diagnosis of obesity with Sharice today and Melisha agreed to give [place] permission to discuss obesity behavioral modification therapy today.  ASSESS: Kaitlin has the diagnosis of obesity and her BMI today is 28.4. Rhylee is in the action stage of change.   ADVISE: Marlene was educated on the multiple health risks of obesity as well as the benefit of weight loss to improve her health. She was advised of the need for long term treatment and the importance of  lifestyle modifications to improve her current health and to decrease her risk of future health problems.  AGREE: Multiple dietary modification options and treatment options were discussed and Harlan agreed to follow the recommendations documented in the above note.  ARRANGE: Staceyann was educated on the importance of frequent visits to treat obesity as outlined per CMS and USPSTF guidelines and agreed to schedule her next follow up appointment today.  Attestation Statements:   Reviewed by clinician on day of visit: allergies, medications, problem list, medical history, surgical history, family history, social history, and previous encounter notes.  [name], am acting as Location manager for [employer], DO   I have reviewed the above documentation for accuracy and completeness, and I agree with the above. Jearld Lesch, DO

## 2019-11-29 DIAGNOSIS — M47812 Spondylosis without myelopathy or radiculopathy, cervical region: Secondary | ICD-10-CM | POA: Diagnosis not present

## 2019-12-01 ENCOUNTER — Other Ambulatory Visit: Payer: Self-pay | Admitting: Family Medicine

## 2019-12-03 DIAGNOSIS — M47812 Spondylosis without myelopathy or radiculopathy, cervical region: Secondary | ICD-10-CM | POA: Diagnosis not present

## 2019-12-05 DIAGNOSIS — M47812 Spondylosis without myelopathy or radiculopathy, cervical region: Secondary | ICD-10-CM | POA: Diagnosis not present

## 2019-12-09 ENCOUNTER — Encounter (INDEPENDENT_AMBULATORY_CARE_PROVIDER_SITE_OTHER): Payer: Self-pay | Admitting: Bariatrics

## 2019-12-09 ENCOUNTER — Ambulatory Visit (INDEPENDENT_AMBULATORY_CARE_PROVIDER_SITE_OTHER): Payer: PPO | Admitting: Bariatrics

## 2019-12-09 ENCOUNTER — Other Ambulatory Visit: Payer: Self-pay

## 2019-12-09 VITALS — BP 119/73 | HR 72 | Temp 97.8°F | Ht 62.0 in | Wt 154.0 lb

## 2019-12-09 DIAGNOSIS — Z6830 Body mass index (BMI) 30.0-30.9, adult: Secondary | ICD-10-CM | POA: Diagnosis not present

## 2019-12-09 DIAGNOSIS — E038 Other specified hypothyroidism: Secondary | ICD-10-CM

## 2019-12-09 DIAGNOSIS — E669 Obesity, unspecified: Secondary | ICD-10-CM | POA: Diagnosis not present

## 2019-12-09 DIAGNOSIS — I10 Essential (primary) hypertension: Secondary | ICD-10-CM | POA: Diagnosis not present

## 2019-12-10 ENCOUNTER — Encounter (INDEPENDENT_AMBULATORY_CARE_PROVIDER_SITE_OTHER): Payer: Self-pay | Admitting: Bariatrics

## 2019-12-10 DIAGNOSIS — M47812 Spondylosis without myelopathy or radiculopathy, cervical region: Secondary | ICD-10-CM | POA: Diagnosis not present

## 2019-12-10 NOTE — Progress Notes (Signed)
Chief Complaint:   OBESITY Shannon Bond is here to discuss her progress with her obesity treatment plan along with follow-up of her obesity related diagnoses. Shannon Bond is on the Category 1 Plan and states she is following her eating plan approximately 100% of the time. Shannon Bond states she is walking/resistance/yoga 60 minutes 7 times per week.  Today's visit was #: 11 Starting weight: 183 lbs Starting date: 06/24/2019 Today's weight: 154 lbs Today's date: 12/09/2019 Total lbs lost to date: 29 Total lbs lost since last in-office visit: 1  Interim History: Shannon Bond is down 1 additional lb since her last visit. She reports doing well with exercise and with water intake. Her goal is to lose down to 150 lbs.  Subjective:   Essential hypertension. Shannon Bond is taking HCTZ and Zestril. Blood pressure is well controlled.  BP Readings from Last 3 Encounters:  12/09/19 119/73  11/25/19 119/78  11/06/19 137/82   Lab Results  Component Value Date   CREATININE 1.26 (H) 10/11/2019   CREATININE 1.64 (H) 05/28/2019   CREATININE 1.18 05/20/2019   Other specified hypothyroidism. Shannon Bond is taking Synthroid.   Lab Results  Component Value Date   TSH 0.17 (L) 11/25/2019    Assessment/Plan:   Essential hypertension. [name] is working on healthy weight loss and exercise to improve blood pressure control. We will watch for signs of hypotension as she continues her lifestyle modifications. She will continue her medications as directed.   Other specified hypothyroidism. Orders and follow up as documented in patient record. Shannon Bond will continue her medication as directed.   Counseling . Good thyroid control is important for overall health. Supratherapeutic thyroid levels are dangerous and will not improve weight loss results. . Counseling: The correct way to take levothyroxine is fasting, with water, separated by at least 30 minutes from breakfast, and separated by more than 4 hours from calcium,  iron, multivitamins, acid reflux medications (PPIs).   Class 1 obesity with serious comorbidity and body mass index (BMI) of 30.0 to 30.9 in adult, unspecified obesity type - BMI greater than 30 at start.  Shannon Bond is currently in the action stage of change. As such, her goal is to continue with weight loss efforts. She has agreed to the Category 1 Plan.   She will work on meal planning, intentional eating, and will remain adherent to the plan.  Exercise goals: Shannon Bond should follow the adult guidelines. When Shannon Bond cannot meet the adult guidelines, they should be as physically active as their abilities and conditions will allow.   Behavioral modification strategies: increasing lean protein intake, decreasing simple carbohydrates, increasing vegetables, increasing water intake, decreasing eating out, no skipping meals, meal planning and cooking strategies, keeping healthy foods in the home and avoiding temptations.  Shannon Bond has agreed to follow-up with our clinic in 2 weeks. She was informed of the importance of frequent follow-up visits to maximize her success with intensive lifestyle modifications for her multiple health conditions.   Objective:   Blood pressure 119/73, pulse 72, temperature 97.8 F (36.6 C), height 5' 2"  (1.575 m), weight 154 lb (69.9 kg), SpO2 99 %. Body mass index is 28.17 kg/m.  General: Cooperative, alert, well developed, in no acute distress. HEENT: Conjunctivae and lids unremarkable. Cardiovascular: Regular rhythm.  Lungs: Normal work of breathing. Neurologic: No focal deficits.   Lab Results  Component Value Date   CREATININE 1.26 (H) 10/11/2019   BUN 53 (H) 10/11/2019   NA 138 10/11/2019   K 4.5 10/11/2019  CL 105 10/11/2019   CO2 22 10/11/2019   Lab Results  Component Value Date   ALT 11 10/11/2019   AST 17 10/11/2019   ALKPHOS 43 10/11/2019   BILITOT 0.7 10/11/2019   Lab Results  Component Value Date   HGBA1C 5.1 06/24/2019   Lab  Results  Component Value Date   INSULIN 5.6 06/24/2019   Lab Results  Component Value Date   TSH 0.17 (L) 11/25/2019   Lab Results  Component Value Date   CHOL 120 10/11/2019   HDL 44.70 10/11/2019   LDLCALC 53 10/11/2019   TRIG 113.0 10/11/2019   CHOLHDL 3 10/11/2019   Lab Results  Component Value Date   WBC 5.5 10/11/2019   HGB 12.4 10/11/2019   HCT 36.5 10/11/2019   MCV 97.5 10/11/2019   PLT 269.0 10/11/2019   Lab Results  Component Value Date   IRON 88 05/28/2019   FERRITIN 88.6 05/28/2019   Obesity Behavioral Intervention Documentation for Insurance:   Approximately 15 minutes were spent on the discussion below.  ASK: We discussed the diagnosis of obesity with Shannon Bond today and Shannon Bond agreed to give [place] permission to discuss obesity behavioral modification therapy today.  ASSESS: Shannon Bond has the diagnosis of obesity and her BMI today is 28.3. Shannon Bond is in the action stage of change.   ADVISE: Shannon Bond was educated on the multiple health risks of obesity as well as the benefit of weight loss to improve her health. She was advised of the need for long term treatment and the importance of lifestyle modifications to improve her current health and to decrease her risk of future health problems.  AGREE: Multiple dietary modification options and treatment options were discussed and Shannon Bond agreed to follow the recommendations documented in the above note.  ARRANGE: Shannon Bond was educated on the importance of frequent visits to treat obesity as outlined per CMS and USPSTF guidelines and agreed to schedule her next follow up appointment today.  Attestation Statements:   Reviewed by clinician on day of visit: allergies, medications, problem list, medical history, surgical history, family history, social history, and previous encounter notes.  [name], am acting as Location manager for [employer], DO   I have reviewed the above documentation for accuracy and  completeness, and I agree with the above. Jearld Lesch, DO

## 2019-12-13 DIAGNOSIS — M47812 Spondylosis without myelopathy or radiculopathy, cervical region: Secondary | ICD-10-CM | POA: Diagnosis not present

## 2019-12-17 DIAGNOSIS — M47812 Spondylosis without myelopathy or radiculopathy, cervical region: Secondary | ICD-10-CM | POA: Diagnosis not present

## 2019-12-20 DIAGNOSIS — M47812 Spondylosis without myelopathy or radiculopathy, cervical region: Secondary | ICD-10-CM | POA: Diagnosis not present

## 2019-12-26 ENCOUNTER — Encounter (INDEPENDENT_AMBULATORY_CARE_PROVIDER_SITE_OTHER): Payer: Self-pay | Admitting: Bariatrics

## 2019-12-26 ENCOUNTER — Other Ambulatory Visit: Payer: Self-pay

## 2019-12-26 ENCOUNTER — Ambulatory Visit (INDEPENDENT_AMBULATORY_CARE_PROVIDER_SITE_OTHER): Payer: PPO | Admitting: Bariatrics

## 2019-12-26 VITALS — BP 131/78 | HR 67 | Temp 97.7°F | Ht 62.0 in | Wt 153.0 lb

## 2019-12-26 DIAGNOSIS — E038 Other specified hypothyroidism: Secondary | ICD-10-CM

## 2019-12-26 DIAGNOSIS — E669 Obesity, unspecified: Secondary | ICD-10-CM

## 2019-12-26 DIAGNOSIS — I10 Essential (primary) hypertension: Secondary | ICD-10-CM | POA: Diagnosis not present

## 2019-12-26 DIAGNOSIS — Z6830 Body mass index (BMI) 30.0-30.9, adult: Secondary | ICD-10-CM

## 2019-12-31 NOTE — Progress Notes (Signed)
Chief Complaint:   OBESITY Miyah is here to discuss her progress with her obesity treatment plan along with follow-up of her obesity related diagnoses. Foster is on the Category 1 Plan and states she is following her eating plan approximately 99% of the time. Lamar states she is walking 7,000 steps 7 days per week.  Today's visit was #: 12 Starting weight: 183 lbs Starting date: 06/24/2019 Today's weight: 153 lbs Today's date: 12/26/2019 Total lbs lost to date: 30 lbs Total lbs lost since last in-office visit: 1 lb  Interim History: Deyja is down 1 additional pound since her last visit, but has done well overall.   She is doing PT for her upper back.  Subjective:   1. Other specified hypothyroidism Lataria is taking levothyroxine 112 mcg daily.  Controlled.   Lab Results  Component Value Date   TSH 0.17 (L) 11/25/2019   2. Essential hypertension Review: taking medications as instructed, no medication side effects noted, no chest pain on exertion, no dyspnea on exertion, no swelling of ankles. She is taking HCTZ and Zestril.  Blood pressure is controlled.  BP Readings from Last 3 Encounters:  12/26/19 131/78  12/09/19 119/73  11/25/19 119/78   Assessment/Plan:   1. Other specified hypothyroidism Patient with long-standing hypothyroidism, on levothyroxine therapy. She appears euthyroid. Orders and follow up as documented in patient record.  Continue Synthroid.  Counseling . Good thyroid control is important for overall health. Supratherapeutic thyroid levels are dangerous and will not improve weight loss results. . The correct way to take levothyroxine is fasting, with water, separated by at least 30 minutes from breakfast, and separated by more than 4 hours from calcium, iron, multivitamins, acid reflux medications (PPIs).   2. Essential hypertension Joselyne is working on healthy weight loss and exercise to improve blood pressure control. We will watch for signs of  hypotension as she continues her lifestyle modifications.  Continue medications.   3. Class 1 obesity with serious comorbidity and body mass index (BMI) of 30.0 to 30.9 in adult, unspecified obesity type Savonna is currently in the action stage of change. As such, her goal is to continue with weight loss efforts. She has agreed to the Category 1 Plan.  She will work on meal planning, intentional eating, and will do hip and shoulder exercises.   Exercise goals: Continue walking daily.  Behavioral modification strategies: increasing lean protein intake, decreasing simple carbohydrates, increasing vegetables, increasing water intake, decreasing eating out, no skipping meals, meal planning and cooking strategies, keeping healthy foods in the home and planning for success.  Illianna has agreed to follow-up with our clinic in 2-3 weeks. She was informed of the importance of frequent follow-up visits to maximize her success with intensive lifestyle modifications for her multiple health conditions.   Objective:   Blood pressure 131/78, pulse 67, temperature 97.7 F (36.5 C), height 5' 2"  (1.575 m), weight 153 lb (69.4 kg), SpO2 100 %. Body mass index is 27.98 kg/m.  General: Cooperative, alert, well developed, in no acute distress. HEENT: Conjunctivae and lids unremarkable. Cardiovascular: Regular rhythm.  Lungs: Normal work of breathing. Neurologic: No focal deficits.   Lab Results  Component Value Date   CREATININE 1.26 (H) 10/11/2019   BUN 53 (H) 10/11/2019   NA 138 10/11/2019   K 4.5 10/11/2019   CL 105 10/11/2019   CO2 22 10/11/2019   Lab Results  Component Value Date   ALT 11 10/11/2019   AST 17 10/11/2019  ALKPHOS 43 10/11/2019   BILITOT 0.7 10/11/2019   Lab Results  Component Value Date   HGBA1C 5.1 06/24/2019   Lab Results  Component Value Date   INSULIN 5.6 06/24/2019   Lab Results  Component Value Date   TSH 0.17 (L) 11/25/2019   Lab Results  Component Value Date    CHOL 120 10/11/2019   HDL 44.70 10/11/2019   LDLCALC 53 10/11/2019   TRIG 113.0 10/11/2019   CHOLHDL 3 10/11/2019   Lab Results  Component Value Date   WBC 5.5 10/11/2019   HGB 12.4 10/11/2019   HCT 36.5 10/11/2019   MCV 97.5 10/11/2019   PLT 269.0 10/11/2019   Lab Results  Component Value Date   IRON 88 05/28/2019   FERRITIN 88.6 05/28/2019   Obesity Behavioral Intervention:   Approximately 15 minutes were spent on the discussion below.  ASK: We discussed the diagnosis of obesity with Rex today and Yasmene agreed to give [place] permission to discuss obesity behavioral modification therapy today.  ASSESS: Lylianna has the diagnosis of obesity and her BMI today is 28.0. Taelyn is in the action stage of change.   ADVISE: Shekita was educated on the multiple health risks of obesity as well as the benefit of weight loss to improve her health. She was advised of the need for long term treatment and the importance of lifestyle modifications to improve her current health and to decrease her risk of future health problems.  AGREE: Multiple dietary modification options and treatment options were discussed and Kemani agreed to follow the recommendations documented in the above note.  ARRANGE: Hilaria was educated on the importance of frequent visits to treat obesity as outlined per CMS and USPSTF guidelines and agreed to schedule her next follow up appointment today.  Attestation Statements:   Reviewed by clinician on day of visit: allergies, medications, problem list, medical history, surgical history, family history, social history, and previous encounter notes.  I, Water quality scientist, CMA, am acting as Location manager for [employer], DO  I have reviewed the above documentation for accuracy and completeness, and I agree with the above. Jearld Lesch, DO

## 2020-01-01 ENCOUNTER — Encounter (INDEPENDENT_AMBULATORY_CARE_PROVIDER_SITE_OTHER): Payer: Self-pay | Admitting: Bariatrics

## 2020-01-09 ENCOUNTER — Encounter (INDEPENDENT_AMBULATORY_CARE_PROVIDER_SITE_OTHER): Payer: Self-pay | Admitting: Bariatrics

## 2020-01-09 ENCOUNTER — Ambulatory Visit (INDEPENDENT_AMBULATORY_CARE_PROVIDER_SITE_OTHER): Payer: PPO | Admitting: Bariatrics

## 2020-01-09 ENCOUNTER — Other Ambulatory Visit: Payer: Self-pay

## 2020-01-09 VITALS — BP 126/79 | HR 83 | Temp 97.9°F | Ht 62.0 in | Wt 150.0 lb

## 2020-01-09 DIAGNOSIS — E669 Obesity, unspecified: Secondary | ICD-10-CM

## 2020-01-09 DIAGNOSIS — E038 Other specified hypothyroidism: Secondary | ICD-10-CM

## 2020-01-09 DIAGNOSIS — Z6830 Body mass index (BMI) 30.0-30.9, adult: Secondary | ICD-10-CM

## 2020-01-09 DIAGNOSIS — I10 Essential (primary) hypertension: Secondary | ICD-10-CM | POA: Diagnosis not present

## 2020-01-12 ENCOUNTER — Other Ambulatory Visit: Payer: Self-pay | Admitting: Family Medicine

## 2020-01-12 DIAGNOSIS — I10 Essential (primary) hypertension: Secondary | ICD-10-CM

## 2020-01-13 NOTE — Progress Notes (Signed)
Chief Complaint:   OBESITY Tarini is here to discuss her progress with her obesity treatment plan along with follow-up of her obesity related diagnoses. Teleshia is on the Category 1 Plan and states she is following her eating plan approximately 100% of the time. Catalia states she is walking/doing resistance training/doing yoga for 60 minutes 7 times per week.  Today's visit was #: 50 Starting weight: 183 lbs Starting date: 06/24/2019 Today's weight: 150 lbs Today's date: 01/09/2020 Total lbs lost to date: 33 lbs Total lbs lost since last in-office visit: 3 lbs  Interim History: Ricka is down an additional 3 pounds and doing well overall.  She is doing well with her water.  Subjective:   1. Essential hypertension Review: taking medications as instructed, no medication side effects noted, no chest pain on exertion, no dyspnea on exertion, no swelling of ankles.  Blood pressure is controlled.  BP Readings from Last 3 Encounters:  01/09/20 126/79  12/26/19 131/78  12/09/19 119/73   2. Other specified hypothyroidism She is taking Synthroid.  Energy is okay.  Lab Results  Component Value Date   TSH 0.17 (L) 11/25/2019   Assessment/Plan:   1. Essential hypertension Lenya is working on healthy weight loss and exercise to improve blood pressure control. We will watch for signs of hypotension as she continues her lifestyle modifications.  Continue medication.  2. Other specified hypothyroidism Patient with long-standing hypothyroidism, on levothyroxine therapy. She appears euthyroid. Orders and follow up as documented in patient record.  Continue Synthroid.  Counseling . Good thyroid control is important for overall health. Supratherapeutic thyroid levels are dangerous and will not improve weight loss results. . The correct way to take levothyroxine is fasting, with water, separated by at least 30 minutes from breakfast, and separated by more than 4 hours from calcium, iron,  multivitamins, acid reflux medications (PPIs).   3. Class 1 obesity with serious comorbidity and body mass index (BMI) of 30.0 to 30.9 in adult, unspecified obesity type  Bannie is currently in the action stage of change. As such, her goal is to continue with weight loss efforts. She has agreed to the Category 1 Plan.   Exercise goals: Continue walking, resistance training, and yoga.  Behavioral modification strategies: increasing lean protein intake, decreasing simple carbohydrates, increasing vegetables, increasing water intake, decreasing eating out, no skipping meals, meal planning and cooking strategies, keeping healthy foods in the home and planning for success.  Brya has agreed to follow-up with our clinic in 3 weeks. She was informed of the importance of frequent follow-up visits to maximize her success with intensive lifestyle modifications for her multiple health conditions.   Objective:   Blood pressure 126/79, pulse 83, temperature 97.9 F (36.6 C), height 5' 2"  (1.575 m), weight 150 lb (68 kg), SpO2 98 %. Body mass index is 27.44 kg/m.  General: Cooperative, alert, well developed, in no acute distress. HEENT: Conjunctivae and lids unremarkable. Cardiovascular: Regular rhythm.  Lungs: Normal work of breathing. Neurologic: No focal deficits.   Lab Results  Component Value Date   CREATININE 1.26 (H) 10/11/2019   BUN 53 (H) 10/11/2019   NA 138 10/11/2019   K 4.5 10/11/2019   CL 105 10/11/2019   CO2 22 10/11/2019   Lab Results  Component Value Date   ALT 11 10/11/2019   AST 17 10/11/2019   ALKPHOS 43 10/11/2019   BILITOT 0.7 10/11/2019   Lab Results  Component Value Date   HGBA1C 5.1 06/24/2019  Lab Results  Component Value Date   INSULIN 5.6 06/24/2019   Lab Results  Component Value Date   TSH 0.17 (L) 11/25/2019   Lab Results  Component Value Date   CHOL 120 10/11/2019   HDL 44.70 10/11/2019   LDLCALC 53 10/11/2019   TRIG 113.0 10/11/2019    CHOLHDL 3 10/11/2019   Lab Results  Component Value Date   WBC 5.5 10/11/2019   HGB 12.4 10/11/2019   HCT 36.5 10/11/2019   MCV 97.5 10/11/2019   PLT 269.0 10/11/2019   Lab Results  Component Value Date   IRON 88 05/28/2019   FERRITIN 88.6 05/28/2019   Obesity Behavioral Intervention:   Approximately 15 minutes were spent on the discussion below.  ASK: We discussed the diagnosis of obesity with Cortez today and Natania agreed to give [place] permission to discuss obesity behavioral modification therapy today.  ASSESS: Shanigua has the diagnosis of obesity and her BMI today is 27.5. Jaleiah is in the action stage of change.   ADVISE: Shariya was educated on the multiple health risks of obesity as well as the benefit of weight loss to improve her health. She was advised of the need for long term treatment and the importance of lifestyle modifications to improve her current health and to decrease her risk of future health problems.  AGREE: Multiple dietary modification options and treatment options were discussed and Miamarie agreed to follow the recommendations documented in the above note.  ARRANGE: Mckensie was educated on the importance of frequent visits to treat obesity as outlined per CMS and USPSTF guidelines and agreed to schedule her next follow up appointment today.  Attestation Statements:   Reviewed by clinician on day of visit: allergies, medications, problem list, medical history, surgical history, family history, social history, and previous encounter notes.  I, Water quality scientist, CMA, am acting as Location manager for [employer], DO  I have reviewed the above documentation for accuracy and completeness, and I agree with the above. Jearld Lesch, DO

## 2020-01-14 ENCOUNTER — Encounter (INDEPENDENT_AMBULATORY_CARE_PROVIDER_SITE_OTHER): Payer: Self-pay | Admitting: Bariatrics

## 2020-01-14 ENCOUNTER — Ambulatory Visit
Admission: RE | Admit: 2020-01-14 | Discharge: 2020-01-14 | Disposition: A | Discharge status: Home / Self Care | Payer: PPO | Source: Ambulatory Visit | Attending: Nephrology | Admitting: Nephrology

## 2020-01-14 DIAGNOSIS — N2581 Secondary hyperparathyroidism of renal origin: Secondary | ICD-10-CM

## 2020-01-14 DIAGNOSIS — D631 Anemia in chronic kidney disease: Secondary | ICD-10-CM

## 2020-01-14 DIAGNOSIS — N179 Acute kidney failure, unspecified: Secondary | ICD-10-CM

## 2020-01-14 DIAGNOSIS — I10 Essential (primary) hypertension: Secondary | ICD-10-CM | POA: Diagnosis not present

## 2020-01-14 DIAGNOSIS — N189 Chronic kidney disease, unspecified: Secondary | ICD-10-CM

## 2020-01-14 DIAGNOSIS — N1832 Chronic kidney disease, stage 3b: Secondary | ICD-10-CM

## 2020-01-14 DIAGNOSIS — N261 Atrophy of kidney (terminal): Secondary | ICD-10-CM | POA: Diagnosis not present

## 2020-01-14 DIAGNOSIS — N2889 Other specified disorders of kidney and ureter: Secondary | ICD-10-CM

## 2020-01-23 DIAGNOSIS — M720 Palmar fascial fibromatosis [Dupuytren]: Secondary | ICD-10-CM | POA: Diagnosis not present

## 2020-01-28 DIAGNOSIS — L219 Seborrheic dermatitis, unspecified: Secondary | ICD-10-CM | POA: Diagnosis not present

## 2020-01-28 DIAGNOSIS — D2361 Other benign neoplasm of skin of right upper limb, including shoulder: Secondary | ICD-10-CM | POA: Diagnosis not present

## 2020-01-28 DIAGNOSIS — D0462 Carcinoma in situ of skin of left upper limb, including shoulder: Secondary | ICD-10-CM | POA: Diagnosis not present

## 2020-01-28 DIAGNOSIS — D2271 Melanocytic nevi of right lower limb, including hip: Secondary | ICD-10-CM | POA: Diagnosis not present

## 2020-01-28 DIAGNOSIS — Z85828 Personal history of other malignant neoplasm of skin: Secondary | ICD-10-CM | POA: Diagnosis not present

## 2020-01-28 DIAGNOSIS — L814 Other melanin hyperpigmentation: Secondary | ICD-10-CM | POA: Diagnosis not present

## 2020-01-28 DIAGNOSIS — L821 Other seborrheic keratosis: Secondary | ICD-10-CM | POA: Diagnosis not present

## 2020-01-28 DIAGNOSIS — D485 Neoplasm of uncertain behavior of skin: Secondary | ICD-10-CM | POA: Diagnosis not present

## 2020-01-28 DIAGNOSIS — L570 Actinic keratosis: Secondary | ICD-10-CM | POA: Diagnosis not present

## 2020-01-28 DIAGNOSIS — D1801 Hemangioma of skin and subcutaneous tissue: Secondary | ICD-10-CM | POA: Diagnosis not present

## 2020-01-30 ENCOUNTER — Encounter (INDEPENDENT_AMBULATORY_CARE_PROVIDER_SITE_OTHER): Payer: Self-pay | Admitting: Bariatrics

## 2020-01-30 ENCOUNTER — Other Ambulatory Visit: Payer: Self-pay

## 2020-01-30 ENCOUNTER — Ambulatory Visit (INDEPENDENT_AMBULATORY_CARE_PROVIDER_SITE_OTHER): Payer: PPO | Admitting: Bariatrics

## 2020-01-30 VITALS — BP 138/77 | HR 77 | Temp 97.7°F | Ht 62.0 in | Wt 149.0 lb

## 2020-01-30 DIAGNOSIS — E7849 Other hyperlipidemia: Secondary | ICD-10-CM

## 2020-01-30 DIAGNOSIS — Z6830 Body mass index (BMI) 30.0-30.9, adult: Secondary | ICD-10-CM

## 2020-01-30 DIAGNOSIS — E669 Obesity, unspecified: Secondary | ICD-10-CM

## 2020-01-30 DIAGNOSIS — I10 Essential (primary) hypertension: Secondary | ICD-10-CM | POA: Diagnosis not present

## 2020-02-03 ENCOUNTER — Encounter (INDEPENDENT_AMBULATORY_CARE_PROVIDER_SITE_OTHER): Payer: Self-pay | Admitting: Bariatrics

## 2020-02-03 NOTE — Progress Notes (Signed)
Chief Complaint:   OBESITY Farzana Koci is here to discuss her progress with her obesity treatment plan along with follow-up of her obesity related diagnoses. Tiann is on the Category 1 Plan and states she is following her eating plan approximately 100% of the time. Cobie states she is walking/yoga/cardio 60 minutes 7 times per week.  Today's visit was #: 14 Starting weight: 183 lbs Starting date: 06/24/2019 Today's weight: 149 lbs Today's date: 01/30/2020 Total lbs lost to date: 34 Total lbs lost since last in-office visit: 1  Interim History: Sheronica is down 1 lb and doing well overall. She reports it has been easy to stay on the plan.  Subjective:   Essential hypertension. Shajuan is taking HCTZ and Zestril. Blood pressure is within normal limits.  BP Readings from Last 3 Encounters:  01/30/20 138/77  01/09/20 126/79  12/26/19 131/78   Lab Results  Component Value Date   CREATININE 1.26 (H) 10/11/2019   CREATININE 1.64 (H) 05/28/2019   CREATININE 1.18 05/20/2019   Other hyperlipidemia. Eather is taking Crestor.  Lab Results  Component Value Date   CHOL 120 10/11/2019   HDL 44.70 10/11/2019   LDLCALC 53 10/11/2019   TRIG 113.0 10/11/2019   CHOLHDL 3 10/11/2019   Lab Results  Component Value Date   ALT 11 10/11/2019   AST 17 10/11/2019   ALKPHOS 43 10/11/2019   BILITOT 0.7 10/11/2019   The ASCVD Risk score (Lampasas., et al., 2013) failed to calculate for the following reasons:   The valid total cholesterol range is 130 to 320 mg/dL  Assessment/Plan:   Essential hypertension. Valeta is working on healthy weight loss and exercise to improve blood pressure control. We will watch for signs of hypotension as she continues her lifestyle modifications. She will continue her medications as directed.   Other hyperlipidemia. Cardiovascular risk and specific lipid/LDL goals reviewed.  We discussed several lifestyle modifications today and Loreena will continue  to work on diet, exercise and weight loss efforts. Orders and follow up as documented in patient record. She will continue Crestor as directed.   Counseling Intensive lifestyle modifications are the first line treatment for this issue. . Dietary changes: Increase soluble fiber. Decrease simple carbohydrates. . Exercise changes: Moderate to vigorous-intensity aerobic activity 150 minutes per week if tolerated. . Lipid-lowering medications: see documented in medical record.  Class 1 obesity with serious comorbidity and body mass index (BMI) of 30.0 to 30.9 in adult, unspecified obesity type.  Rashad is currently in the action stage of change. As such, her goal is to continue with weight loss efforts. She has agreed to the Category 1 Plan.   She will work on meal planning and mindful eating.  Exercise goals: Addisyn will continue walking more and increasing her endurance.  Behavioral modification strategies: increasing lean protein intake, decreasing simple carbohydrates, increasing vegetables, increasing water intake, decreasing eating out, no skipping meals, meal planning and cooking strategies, keeping healthy foods in the home and planning for success.  Hennessy has agreed to follow-up with our clinic in 3 weeks. She was informed of the importance of frequent follow-up visits to maximize her success with intensive lifestyle modifications for her multiple health conditions.   Objective:   Blood pressure 138/77, pulse 77, temperature 97.7 F (36.5 C), height 5' 2"  (1.575 m), weight 149 lb (67.6 kg), SpO2 99 %. Body mass index is 27.25 kg/m.  General: Cooperative, alert, well developed, in no acute distress. HEENT: Conjunctivae and lids  unremarkable. Cardiovascular: Regular rhythm.  Lungs: Normal work of breathing. Neurologic: No focal deficits.   Lab Results  Component Value Date   CREATININE 1.26 (H) 10/11/2019   BUN 53 (H) 10/11/2019   NA 138 10/11/2019   K 4.5 10/11/2019   CL  105 10/11/2019   CO2 22 10/11/2019   Lab Results  Component Value Date   ALT 11 10/11/2019   AST 17 10/11/2019   ALKPHOS 43 10/11/2019   BILITOT 0.7 10/11/2019   Lab Results  Component Value Date   HGBA1C 5.1 06/24/2019   Lab Results  Component Value Date   INSULIN 5.6 06/24/2019   Lab Results  Component Value Date   TSH 0.17 (L) 11/25/2019   Lab Results  Component Value Date   CHOL 120 10/11/2019   HDL 44.70 10/11/2019   LDLCALC 53 10/11/2019   TRIG 113.0 10/11/2019   CHOLHDL 3 10/11/2019   Lab Results  Component Value Date   WBC 5.5 10/11/2019   HGB 12.4 10/11/2019   HCT 36.5 10/11/2019   MCV 97.5 10/11/2019   PLT 269.0 10/11/2019   Lab Results  Component Value Date   IRON 88 05/28/2019   FERRITIN 88.6 05/28/2019   Obesity Behavioral Intervention:   Approximately 15 minutes were spent on the discussion below.  ASK: We discussed the diagnosis of obesity with Ynez today and Evetta agreed to give [place] permission to discuss obesity behavioral modification therapy today.  ASSESS: Kylinn has the diagnosis of obesity and her BMI today is 27.3. Anvi is in the action stage of change.   ADVISE: Royal was educated on the multiple health risks of obesity as well as the benefit of weight loss to improve her health. She was advised of the need for long term treatment and the importance of lifestyle modifications to improve her current health and to decrease her risk of future health problems.  AGREE: Multiple dietary modification options and treatment options were discussed and Ernesta agreed to follow the recommendations documented in the above note.  ARRANGE: Delight was educated on the importance of frequent visits to treat obesity as outlined per CMS and USPSTF guidelines and agreed to schedule her next follow up appointment today.  Attestation Statements:   Reviewed by clinician on day of visit: allergies, medications, problem list, medical history, surgical  history, family history, social history, and previous encounter notes.  [name], am acting as Location manager for [employer], DO   I have reviewed the above documentation for accuracy and completeness, and I agree with the above. Jearld Lesch, DO

## 2020-02-06 ENCOUNTER — Other Ambulatory Visit: Payer: Self-pay | Admitting: Family Medicine

## 2020-02-10 ENCOUNTER — Other Ambulatory Visit: Payer: Self-pay | Admitting: Family Medicine

## 2020-02-12 DIAGNOSIS — N179 Acute kidney failure, unspecified: Secondary | ICD-10-CM | POA: Diagnosis not present

## 2020-02-12 DIAGNOSIS — N1832 Chronic kidney disease, stage 3b: Secondary | ICD-10-CM | POA: Diagnosis not present

## 2020-02-12 DIAGNOSIS — K900 Celiac disease: Secondary | ICD-10-CM | POA: Diagnosis not present

## 2020-02-12 DIAGNOSIS — N2581 Secondary hyperparathyroidism of renal origin: Secondary | ICD-10-CM | POA: Diagnosis not present

## 2020-02-12 DIAGNOSIS — N2889 Other specified disorders of kidney and ureter: Secondary | ICD-10-CM | POA: Diagnosis not present

## 2020-02-13 ENCOUNTER — Other Ambulatory Visit: Payer: Self-pay | Admitting: Nephrology

## 2020-02-13 DIAGNOSIS — N189 Chronic kidney disease, unspecified: Secondary | ICD-10-CM

## 2020-02-13 DIAGNOSIS — N179 Acute kidney failure, unspecified: Secondary | ICD-10-CM

## 2020-02-13 DIAGNOSIS — D631 Anemia in chronic kidney disease: Secondary | ICD-10-CM

## 2020-02-13 DIAGNOSIS — N1832 Chronic kidney disease, stage 3b: Secondary | ICD-10-CM

## 2020-02-13 DIAGNOSIS — N2889 Other specified disorders of kidney and ureter: Secondary | ICD-10-CM

## 2020-02-24 ENCOUNTER — Encounter (INDEPENDENT_AMBULATORY_CARE_PROVIDER_SITE_OTHER): Payer: Self-pay | Admitting: Bariatrics

## 2020-02-24 ENCOUNTER — Ambulatory Visit (INDEPENDENT_AMBULATORY_CARE_PROVIDER_SITE_OTHER): Payer: PPO | Admitting: Bariatrics

## 2020-02-24 ENCOUNTER — Other Ambulatory Visit: Payer: Self-pay

## 2020-02-24 VITALS — BP 120/73 | HR 77 | Temp 97.8°F | Ht 62.0 in | Wt 145.0 lb

## 2020-02-24 DIAGNOSIS — D099 Carcinoma in situ, unspecified: Secondary | ICD-10-CM | POA: Diagnosis not present

## 2020-02-24 DIAGNOSIS — E669 Obesity, unspecified: Secondary | ICD-10-CM

## 2020-02-24 DIAGNOSIS — I10 Essential (primary) hypertension: Secondary | ICD-10-CM | POA: Diagnosis not present

## 2020-02-24 DIAGNOSIS — E038 Other specified hypothyroidism: Secondary | ICD-10-CM

## 2020-02-24 DIAGNOSIS — Z6830 Body mass index (BMI) 30.0-30.9, adult: Secondary | ICD-10-CM | POA: Diagnosis not present

## 2020-02-24 NOTE — Progress Notes (Signed)
Chief Complaint:   OBESITY Shannon Bond is here to discuss her progress with her obesity treatment plan along with follow-up of her obesity related diagnoses. Ladaija is on the Category 1 Plan and states she is following her eating plan approximately 100% of the time. Alyana states she is doing yoga/walking/cardio 60 minutes 7 times per week.  Today's visit was #: 15 Starting weight: 183 lbs Starting date: 06/24/2019 Today's weight: 145 lbs Today's date: 02/24/2020 Total lbs lost to date: 38 Total lbs lost since last in-office visit: 4  Interim History: Kaysa is down another 4 lbs and doing well overall. She is walking faster in the cold. She reports doing well with her water intake.  Subjective:   Essential hypertension. Blood pressure is controlled.  BP Readings from Last 3 Encounters:  02/24/20 120/73  01/30/20 138/77  01/09/20 126/79   Lab Results  Component Value Date   CREATININE 1.26 (H) 10/11/2019   CREATININE 1.64 (H) 05/28/2019   CREATININE 1.18 05/20/2019   Other specified hypothyroidism. Tamber is taking Synthroid and denies any issues with the medication.   Lab Results  Component Value Date   TSH 0.17 (L) 11/25/2019   Assessment/Plan:   Essential hypertension. Essica is working on healthy weight loss and exercise to improve blood pressure control. We will watch for signs of hypotension as she continues her lifestyle modifications. She will continue her medication as directed.   Other specified hypothyroidism. Patient with long-standing hypothyroidism, on levothyroxine therapy. She appears euthyroid. Orders and follow up as documented in patient record. Lametria will continue Synthroid as directed.   Counseling . Good thyroid control is important for overall health. Supratherapeutic thyroid levels are dangerous and will not improve weight loss results. . The correct way to take levothyroxine is fasting, with water, separated by at least 30 minutes from  breakfast, and separated by more than 4 hours from calcium, iron, multivitamins, acid reflux medications (PPIs).   Class 1 obesity with serious comorbidity and body mass index (BMI) of 30.0 to 30.9 in adult, unspecified obesity type - BMI greater than 30 at start.  Aritha is currently in the action stage of change. As such, her goal is to continue with weight loss efforts. She has agreed to the Category 1 Plan.    She will continue strict adherence to the plan and will work on meal planning.  Exercise goals: Older adults should follow the adult guidelines. When older adults cannot meet the adult guidelines, they should be as physically active as their abilities and conditions will allow.   Behavioral modification strategies: increasing lean protein intake, decreasing simple carbohydrates, increasing vegetables, increasing water intake, decreasing eating out, no skipping meals, meal planning and cooking strategies, keeping healthy foods in the home and planning for success.  Wetona has agreed to follow-up with our clinic fasting in 2-3 weeks. She was informed of the importance of frequent follow-up visits to maximize her success with intensive lifestyle modifications for her multiple health conditions.   Objective:   Blood pressure 120/73, pulse 77, temperature 97.8 F (36.6 C), temperature source Oral, height 5' 2"  (1.575 m), weight 145 lb (65.8 kg), SpO2 97 %. Body mass index is 26.52 kg/m.  General: Cooperative, alert, well developed, in no acute distress. HEENT: Conjunctivae and lids unremarkable. Cardiovascular: Regular rhythm.  Lungs: Normal work of breathing. Neurologic: No focal deficits.   Lab Results  Component Value Date   CREATININE 1.26 (H) 10/11/2019   BUN 53 (H) 10/11/2019  NA 138 10/11/2019   K 4.5 10/11/2019   CL 105 10/11/2019   CO2 22 10/11/2019   Lab Results  Component Value Date   ALT 11 10/11/2019   AST 17 10/11/2019   ALKPHOS 43 10/11/2019   BILITOT 0.7  10/11/2019   Lab Results  Component Value Date   HGBA1C 5.1 06/24/2019   Lab Results  Component Value Date   INSULIN 5.6 06/24/2019   Lab Results  Component Value Date   TSH 0.17 (L) 11/25/2019   Lab Results  Component Value Date   CHOL 120 10/11/2019   HDL 44.70 10/11/2019   LDLCALC 53 10/11/2019   TRIG 113.0 10/11/2019   CHOLHDL 3 10/11/2019   Lab Results  Component Value Date   WBC 5.5 10/11/2019   HGB 12.4 10/11/2019   HCT 36.5 10/11/2019   MCV 97.5 10/11/2019   PLT 269.0 10/11/2019   Lab Results  Component Value Date   IRON 88 05/28/2019   FERRITIN 88.6 05/28/2019   Obesity Behavioral Intervention:   Approximately 15 minutes were spent on the discussion below.  ASK: We discussed the diagnosis of obesity with Ashleigh today and Stephanny agreed to give [place] permission to discuss obesity behavioral modification therapy today.  ASSESS: Kess has the diagnosis of obesity and her BMI today is 26.6. Negar is in the action stage of change.   ADVISE: Helem was educated on the multiple health risks of obesity as well as the benefit of weight loss to improve her health. She was advised of the need for long term treatment and the importance of lifestyle modifications to improve her current health and to decrease her risk of future health problems.  AGREE: Multiple dietary modification options and treatment options were discussed and Janea agreed to follow the recommendations documented in the above note.  ARRANGE: Blanca was educated on the importance of frequent visits to treat obesity as outlined per CMS and USPSTF guidelines and agreed to schedule her next follow up appointment today.  Attestation Statements:   Reviewed by clinician on day of visit: allergies, medications, problem list, medical history, surgical history, family history, social history, and previous encounter notes.  [name], am acting as Location manager for [employer], DO   I have  reviewed the above documentation for accuracy and completeness, and I agree with the above. Jearld Lesch, DO

## 2020-03-08 ENCOUNTER — Other Ambulatory Visit: Payer: Self-pay | Admitting: Family Medicine

## 2020-03-16 ENCOUNTER — Encounter (INDEPENDENT_AMBULATORY_CARE_PROVIDER_SITE_OTHER): Payer: Self-pay | Admitting: Bariatrics

## 2020-03-16 ENCOUNTER — Other Ambulatory Visit: Payer: Self-pay

## 2020-03-16 ENCOUNTER — Ambulatory Visit (INDEPENDENT_AMBULATORY_CARE_PROVIDER_SITE_OTHER): Payer: PPO | Admitting: Bariatrics

## 2020-03-16 VITALS — BP 130/80 | HR 70 | Temp 97.8°F | Ht 62.0 in | Wt 146.0 lb

## 2020-03-16 DIAGNOSIS — E559 Vitamin D deficiency, unspecified: Secondary | ICD-10-CM | POA: Diagnosis not present

## 2020-03-16 DIAGNOSIS — E538 Deficiency of other specified B group vitamins: Secondary | ICD-10-CM | POA: Diagnosis not present

## 2020-03-16 DIAGNOSIS — Z6830 Body mass index (BMI) 30.0-30.9, adult: Secondary | ICD-10-CM | POA: Diagnosis not present

## 2020-03-16 DIAGNOSIS — D508 Other iron deficiency anemias: Secondary | ICD-10-CM | POA: Diagnosis not present

## 2020-03-16 DIAGNOSIS — E669 Obesity, unspecified: Secondary | ICD-10-CM

## 2020-03-16 DIAGNOSIS — I10 Essential (primary) hypertension: Secondary | ICD-10-CM | POA: Diagnosis not present

## 2020-03-16 DIAGNOSIS — E038 Other specified hypothyroidism: Secondary | ICD-10-CM | POA: Diagnosis not present

## 2020-03-16 NOTE — Progress Notes (Signed)
Chief Complaint:   OBESITY Shannon Bond is here to discuss her progress with her obesity treatment plan along with follow-up of her obesity related diagnoses. Shannon Bond is on the Category 1 Plan and states she is following her eating plan approximately 978% of the time. Shannon Bond states she is doing yoga/cardio/walking/resistance 60 minutes 7 times per week.  Today's visit was #: 16 Starting weight: 183 lbs Starting date: 06/24/2019 Today's weight: 146 lbs Today's date: 03/16/2020 Total lbs lost to date: 37 Total lbs lost since last in-office visit: 0  Interim History: Shannon Bond is up 1 lb since her last visit, but doing well overall.  Subjective:   Other specified hypothyroidism. Shannon Bond is taking Synthroid.   Lab Results  Component Value Date   TSH 0.17 (L) 11/25/2019   Essential hypertension. Shannon Bond is taking medications.  BP Readings from Last 3 Encounters:  03/16/20 130/80  02/24/20 120/73  01/30/20 138/77   Lab Results  Component Value Date   CREATININE 1.26 (H) 10/11/2019   CREATININE 1.64 (H) 05/28/2019   CREATININE 1.18 05/20/2019   Vitamin D deficiency. Shannon Bond is taking a multivitamin.   Ref. Range 10/26/2018 14:22  Vitamin D, 25-Hydroxy Latest Ref Range: 30 - 100 ng/mL 57   B12 deficiency. Shannon Bond is taking a multivitamin. She had taken B12 in the past.   Lab Results  Component Value Date   VITAMINB12 1,976 (H) 06/24/2019   Other iron deficiency anemia. Shannon Bond is not taking supplements at this time.   CBC Latest Ref Rng & Units 10/11/2019 05/28/2019 04/03/2018  WBC 4.0 - 10.5 K/uL 5.5 9.5 7.0  Hemoglobin 12.0 - 15.0 g/dL 12.4 12.9 13.4  Hematocrit 36 - 46 % 36.5 38.2 39.8  Platelets 150 - 400 K/uL 269.0 315.0 340.0   Lab Results  Component Value Date   IRON 88 05/28/2019   FERRITIN 88.6 05/28/2019   Lab Results  Component Value Date   VITAMINB12 1,976 (H) 06/24/2019   Assessment/Plan:   Other specified hypothyroidism. Patient with long-standing  hypothyroidism, on levothyroxine therapy. She appears euthyroid. Orders and follow up as documented in patient record. Thyroid panel will be checked today.  Counseling . Good thyroid control is important for overall health. Supratherapeutic thyroid levels are dangerous and will not improve weight loss results. . The correct way to take levothyroxine is fasting, with water, separated by at least 30 minutes from breakfast, and separated by more than 4 hours from calcium, iron, multivitamins, acid reflux medications (PPIs).    Essential hypertension. Shannon Bond is working on healthy weight loss and exercise to improve blood pressure control. We will watch for signs of hypotension as she continues her lifestyle modifications. She will continue her medications as directed. Comprehensive metabolic panel will be checked today.  Vitamin D deficiency. Low Vitamin D level contributes to fatigue and are associated with obesity, breast, and colon cancer. VITAMIN D 25 Hydroxy (Vit-D Deficiency, Fractures) level will be checked today.   B12 deficiency. The diagnosis was reviewed with the patient. Counseling provided today, see below. We will continue to monitor. Orders and follow up as documented in patient record. Vitamin B12 level will be checked today.   Counseling . The body needs vitamin B12: to make red blood cells; to make DNA; and to help the nerves work properly so they can carry messages from the brain to the body.  . The main causes of vitamin B12 deficiency include dietary deficiency, digestive diseases, pernicious anemia, and having a surgery in which part  of the stomach or small intestine is removed.  . Certain medicines can make it harder for the body to absorb vitamin B12. These medicines include: heartburn medications; some antibiotics; some medications used to treat diabetes, gout, and high cholesterol.  . In some cases, there are no symptoms of this condition. If the condition leads to anemia or  nerve damage, various symptoms can occur, such as weakness or fatigue, shortness of breath, and numbness or tingling in your hands and feet.   . Treatment:  o May include taking vitamin B12 supplements.  o Avoid alcohol.  o Eat lots of healthy foods that contain vitamin B12: - Beef, pork, chicken, [place], and organ meats, such as liver.  - Seafood: This includes clams, rainbow trout, salmon, tuna, and haddock.  - Eggs.  - Cereal and dairy products that are fortified: This means that vitamin B12 has been added to the food.    Other iron deficiency anemia. Orders and follow up as documented in patient record. CBC with Differential/Platelet will be checked today.  Counseling . Iron is essential for our bodies to make red blood cells.  Reasons that someone may be deficient include: an iron-deficient diet (more likely in those following vegan or vegetarian diets), women with heavy menses, patients with GI disorders or poor absorption, patients that have had bariatric surgery, frequent blood donors, patients with cancer, and patients with heart disease.   Shannon Bond Kitchen An iron supplement has been recommended. This is found over-the-counter.  Shannon Bond foods include dark leafy greens, red and white meats, eggs, seafood, and beans.   . Certain foods and drinks prevent your body from absorbing iron properly. Avoid eating these foods in the same meal as iron-rich foods or with iron supplements. These foods include: coffee, black tea, and red wine; milk, dairy products, and foods that are high in calcium; beans and soybeans; whole grains.  . Constipation can be a side effect of iron supplementation. Increased water and fiber intake are helpful. Water goal: > 2 liters/day. Fiber goal: > 25 grams/day.   Class 1 obesity with serious comorbidity and body mass index (BMI) of 30.0 to 30.9 in adult, unspecified obesity type - BMI greater than 30 at start.  Shannon Bond is currently in the action stage of change. As such, her  goal is to continue with weight loss efforts. She has agreed to the Category 1 Plan.   She will work on meal planning and remaining adherent to the plan.  Exercise goals: Shannon Bond will continue her current exercise regimen.   Behavioral modification strategies: increasing lean protein intake, decreasing simple carbohydrates, increasing vegetables, increasing water intake, decreasing eating out, no skipping meals, meal planning and cooking strategies, keeping healthy foods in the home and planning for success.  Shannon Bond has agreed to follow-up with our clinic in 2 weeks. She was informed of the importance of frequent follow-up visits to maximize her success with intensive lifestyle modifications for her multiple health conditions.   Objective:   Blood pressure 130/80, pulse 70, temperature 97.8 F (36.6 C), height 5' 2"  (1.575 m), weight 146 lb (66.2 kg), SpO2 97 %. Body mass index is 26.7 kg/m.  General: Cooperative, alert, well developed, in no acute distress. HEENT: Conjunctivae and lids unremarkable. Cardiovascular: Regular rhythm.  Lungs: Normal work of breathing. Neurologic: No focal deficits.   Lab Results  Component Value Date   CREATININE 1.26 (H) 10/11/2019   BUN 53 (H) 10/11/2019   NA 138 10/11/2019   K 4.5 10/11/2019  CL 105 10/11/2019   CO2 22 10/11/2019   Lab Results  Component Value Date   ALT 11 10/11/2019   AST 17 10/11/2019   ALKPHOS 43 10/11/2019   BILITOT 0.7 10/11/2019   Lab Results  Component Value Date   HGBA1C 5.1 06/24/2019   Lab Results  Component Value Date   INSULIN 5.6 06/24/2019   Lab Results  Component Value Date   TSH 0.17 (L) 11/25/2019   Lab Results  Component Value Date   CHOL 120 10/11/2019   HDL 44.70 10/11/2019   LDLCALC 53 10/11/2019   TRIG 113.0 10/11/2019   CHOLHDL 3 10/11/2019   Lab Results  Component Value Date   WBC 5.5 10/11/2019   HGB 12.4 10/11/2019   HCT 36.5 10/11/2019   MCV 97.5 10/11/2019   PLT 269.0  10/11/2019   Lab Results  Component Value Date   IRON 88 05/28/2019   FERRITIN 88.6 05/28/2019   Obesity Behavioral Intervention:   Approximately 15 minutes were spent on the discussion below.  ASK: We discussed the diagnosis of obesity with Shannon Bond today and Shannon Bond agreed to give [place] permission to discuss obesity behavioral modification therapy today.  ASSESS: Kelsy has the diagnosis of obesity and her BMI today is 26.8. Yong is in the action stage of change.   ADVISE: Shannon Bond was educated on the multiple health risks of obesity as well as the benefit of weight loss to improve her health. She was advised of the need for long term treatment and the importance of lifestyle modifications to improve her current health and to decrease her risk of future health problems.  AGREE: Multiple dietary modification options and treatment options were discussed and Shannon Bond agreed to follow the recommendations documented in the above note.  ARRANGE: Shannon Bond was educated on the importance of frequent visits to treat obesity as outlined per CMS and USPSTF guidelines and agreed to schedule her next follow up appointment today.  Attestation Statements:   Reviewed by clinician on day of visit: allergies, medications, problem list, medical history, surgical history, family history, social history, and previous encounter notes.  Shannon Bond, am acting as Location manager for [employer], DO   I have reviewed the above documentation for accuracy and completeness, and I agree with the above. Jearld Lesch, DO

## 2020-03-17 LAB — CBC WITH DIFFERENTIAL/PLATELET
Basophils Absolute: 0.1 10*3/uL (ref 0.0–0.2)
Basos: 1 %
EOS (ABSOLUTE): 0.2 10*3/uL (ref 0.0–0.4)
Eos: 4 %
Hematocrit: 39.5 % (ref 34.0–46.6)
Hemoglobin: 12.8 g/dL (ref 11.1–15.9)
Immature Grans (Abs): 0 10*3/uL (ref 0.0–0.1)
Immature Granulocytes: 0 %
Lymphocytes Absolute: 1.8 10*3/uL (ref 0.7–3.1)
Lymphs: 35 %
MCH: 32.1 pg (ref 26.6–33.0)
MCHC: 32.4 g/dL (ref 31.5–35.7)
MCV: 99 fL — ABNORMAL HIGH (ref 79–97)
Monocytes Absolute: 0.4 10*3/uL (ref 0.1–0.9)
Monocytes: 8 %
Neutrophils Absolute: 2.6 10*3/uL (ref 1.4–7.0)
Neutrophils: 52 %
Platelets: 246 10*3/uL (ref 150–450)
RBC: 3.99 x10E6/uL (ref 3.77–5.28)
RDW: 11.8 % (ref 11.7–15.4)
WBC: 5.1 10*3/uL (ref 3.4–10.8)

## 2020-03-17 LAB — COMPREHENSIVE METABOLIC PANEL
ALT: 16 IU/L (ref 0–32)
AST: 20 IU/L (ref 0–40)
Albumin/Globulin Ratio: 1.9 (ref 1.2–2.2)
Albumin: 4.3 g/dL (ref 3.7–4.7)
Alkaline Phosphatase: 47 IU/L (ref 44–121)
BUN/Creatinine Ratio: 34 — ABNORMAL HIGH (ref 12–28)
BUN: 36 mg/dL — ABNORMAL HIGH (ref 8–27)
Bilirubin Total: 0.6 mg/dL (ref 0.0–1.2)
CO2: 21 mmol/L (ref 20–29)
Calcium: 9.5 mg/dL (ref 8.7–10.3)
Chloride: 107 mmol/L — ABNORMAL HIGH (ref 96–106)
Creatinine, Ser: 1.07 mg/dL — ABNORMAL HIGH (ref 0.57–1.00)
GFR calc Af Amer: 57 mL/min/{1.73_m2} — ABNORMAL LOW (ref >59)
GFR calc non Af Amer: 50 mL/min/{1.73_m2} — ABNORMAL LOW (ref >59)
Globulin, Total: 2.3 g/dL (ref 1.5–4.5)
Glucose: 87 mg/dL (ref 65–99)
Potassium: 4.9 mmol/L (ref 3.5–5.2)
Sodium: 140 mmol/L (ref 134–144)
Total Protein: 6.6 g/dL (ref 6.0–8.5)

## 2020-03-17 LAB — TSH+T4F+T3FREE
Free T4: 1.87 ng/dL — ABNORMAL HIGH (ref 0.82–1.77)
T3, Free: 2.7 pg/mL (ref 2.0–4.4)
TSH: 0.739 u[IU]/mL (ref 0.450–4.500)

## 2020-03-17 LAB — VITAMIN D 25 HYDROXY (VIT D DEFICIENCY, FRACTURES): Vit D, 25-Hydroxy: 89.6 ng/mL (ref 30.0–100.0)

## 2020-03-17 LAB — VITAMIN B12: Vitamin B-12: 1630 pg/mL — ABNORMAL HIGH (ref 232–1245)

## 2020-04-06 ENCOUNTER — Encounter (INDEPENDENT_AMBULATORY_CARE_PROVIDER_SITE_OTHER): Payer: Self-pay | Admitting: Bariatrics

## 2020-04-06 ENCOUNTER — Ambulatory Visit (INDEPENDENT_AMBULATORY_CARE_PROVIDER_SITE_OTHER): Payer: PPO | Admitting: Bariatrics

## 2020-04-06 ENCOUNTER — Other Ambulatory Visit: Payer: Self-pay

## 2020-04-06 VITALS — BP 136/74 | HR 76 | Temp 97.9°F | Ht 62.0 in | Wt 145.0 lb

## 2020-04-06 DIAGNOSIS — E669 Obesity, unspecified: Secondary | ICD-10-CM

## 2020-04-06 DIAGNOSIS — E038 Other specified hypothyroidism: Secondary | ICD-10-CM | POA: Diagnosis not present

## 2020-04-06 DIAGNOSIS — I10 Essential (primary) hypertension: Secondary | ICD-10-CM | POA: Diagnosis not present

## 2020-04-06 DIAGNOSIS — Z6830 Body mass index (BMI) 30.0-30.9, adult: Secondary | ICD-10-CM | POA: Diagnosis not present

## 2020-04-06 NOTE — Progress Notes (Signed)
Chief Complaint:   OBESITY Shannon Bond is here to discuss her progress with her obesity treatment plan along with follow-up of her obesity related diagnoses. Shannon Bond is on the Category 1 Plan and states she is following her eating plan approximately 100% of the time. Shannon Bond states she is walking 60 minutes 6 times per week.  Today's visit was #: 61 Starting weight: 183 lbs Starting date: 06/24/2019 Today's weight: 145 lkbs Today's date: 04/06/2020 Total lbs lost to date: 38 Total lbs lost since last in-office visit: 1  Interim History: [name] is down an additional 1 lb since her last visit. She is at a good goal.  Subjective:   Other specified hypothyroidism. Shannon Bond has a history of a thyroid nodule. She is taking Synthroid.   Lab Results  Component Value Date   TSH 0.739 03/16/2020   Essential hypertension. Blood pressure is controlled.  BP Readings from Last 3 Encounters:  04/06/20 136/74  03/16/20 130/80  02/24/20 120/73   Lab Results  Component Value Date   CREATININE 1.07 (H) 03/16/2020   CREATININE 1.26 (H) 10/11/2019   CREATININE 1.64 (H) 05/28/2019   Assessment/Plan:   Other specified hypothyroidism. Patient with long-standing hypothyroidism, on levothyroxine therapy. She appears euthyroid. Orders and follow up as documented in patient record. [name] will continue Synthroid as directed.   Counseling . Good thyroid control is important for overall health. Supratherapeutic thyroid levels are dangerous and will not improve weight loss results. . The correct way to take levothyroxine is fasting, with water, separated by at least 30 minutes from breakfast, and separated by more than 4 hours from calcium, iron, multivitamins, acid reflux medications (PPIs).   Essential hypertension. Shannon Bond is working on healthy weight loss and exercise to improve blood pressure control. We will watch for signs of hypotension as she continues her lifestyle modifications. She  will continue her medications as directed.   Class 1 obesity with serious comorbidity and body mass index (BMI) of 30.0 to 30.9 in adult, unspecified obesity type - BMI greater that 30 at start.  Shannon Bond is currently in the action stage of change. As such, her goal is to continue with weight loss efforts. She has agreed to the Category 1 Plan.   She will work on meal planning and staying adherent to the meal plan.  Exercise goals: Shannon Bond will continue walking for exercise.  Behavioral modification strategies: increasing lean protein intake, decreasing simple carbohydrates, increasing vegetables, increasing water intake, better snacking choices, dealing with family or coworker sabotage, travel eating strategies, holiday eating strategies , celebration eating strategies, avoiding temptations and planning for success.  Shannon Bond has agreed to follow-up with our clinic in 2-3 weeks. She was informed of the importance of frequent follow-up visits to maximize her success with intensive lifestyle modifications for her multiple health conditions.   Objective:   Blood pressure 136/74, pulse 76, temperature 97.9 F (36.6 C), height 5' 2"  (1.575 m), weight 145 lb (65.8 kg), SpO2 98 %. Body mass index is 26.52 kg/m.  General: Cooperative, alert, well developed, in no acute distress. HEENT: Conjunctivae and lids unremarkable. Cardiovascular: Regular rhythm.  Lungs: Normal work of breathing. Neurologic: No focal deficits.   Lab Results  Component Value Date   CREATININE 1.07 (H) 03/16/2020   BUN 36 (H) 03/16/2020   NA 140 03/16/2020   K 4.9 03/16/2020   CL 107 (H) 03/16/2020   CO2 21 03/16/2020   Lab Results  Component Value Date   ALT 16 03/16/2020  AST 20 03/16/2020   ALKPHOS 47 03/16/2020   BILITOT 0.6 03/16/2020   Lab Results  Component Value Date   HGBA1C 5.1 06/24/2019   Lab Results  Component Value Date   INSULIN 5.6 06/24/2019   Lab Results  Component Value Date   TSH  0.739 03/16/2020   Lab Results  Component Value Date   CHOL 120 10/11/2019   HDL 44.70 10/11/2019   LDLCALC 53 10/11/2019   TRIG 113.0 10/11/2019   CHOLHDL 3 10/11/2019   Lab Results  Component Value Date   WBC 5.1 03/16/2020   HGB 12.8 03/16/2020   HCT 39.5 03/16/2020   MCV 99 (H) 03/16/2020   PLT 246 03/16/2020   Lab Results  Component Value Date   IRON 88 05/28/2019   FERRITIN 88.6 05/28/2019   Obesity Behavioral Intervention:   Approximately 15 minutes were spent on the discussion below.  ASK: We discussed the diagnosis of obesity with Shannon Bond today and Shannon Bond agreed to give [place] permission to discuss obesity behavioral modification therapy today.  ASSESS: Shamika has the diagnosis of obesity and her BMI today is 26.7. Shannon Bond is in the action stage of change.   ADVISE: Wrenna was educated on the multiple health risks of obesity as well as the benefit of weight loss to improve her health. She was advised of the need for long term treatment and the importance of lifestyle modifications to improve her current health and to decrease her risk of future health problems.  AGREE: Multiple dietary modification options and treatment options were discussed and Shannon Bond agreed to follow the recommendations documented in the above note.  ARRANGE: Shannon Bond was educated on the importance of frequent visits to treat obesity as outlined per CMS and USPSTF guidelines and agreed to schedule her next follow up appointment today.  Attestation Statements:   Reviewed by clinician on day of visit: allergies, medications, problem list, medical history, surgical history, family history, social history, and previous encounter notes.  Shannon Bond, am acting as Location manager for [employer], DO   I have reviewed the above documentation for accuracy and completeness, and I agree with the above. Shannon Lesch, DO

## 2020-04-07 ENCOUNTER — Encounter: Payer: Self-pay | Admitting: Family Medicine

## 2020-04-07 ENCOUNTER — Ambulatory Visit (INDEPENDENT_AMBULATORY_CARE_PROVIDER_SITE_OTHER): Payer: PPO | Admitting: Family Medicine

## 2020-04-07 VITALS — BP 132/80 | HR 70 | Temp 97.5°F | Ht 62.0 in | Wt 149.1 lb

## 2020-04-07 DIAGNOSIS — I10 Essential (primary) hypertension: Secondary | ICD-10-CM

## 2020-04-07 DIAGNOSIS — E039 Hypothyroidism, unspecified: Secondary | ICD-10-CM

## 2020-04-07 DIAGNOSIS — M810 Age-related osteoporosis without current pathological fracture: Secondary | ICD-10-CM

## 2020-04-07 NOTE — Progress Notes (Signed)
Chief Complaint  Patient presents with  . Follow-up    Subjective Shannon Bond is a 79 y.o. female who presents for hypertension follow up. She does not monitor home blood pressures. She is compliant with medications. Patient has these side effects of medication: none She is adhering to a healthy diet overall. Current exercise: cardio, yoga, walking, resistance training Denies chest pain or shortness of breath.  Hypothyroidism Patient presents for follow-up of hypothyroidism.  Reports compliance with medication. Current symptoms include: denies fatigue, unexplained weight changes, heat/cold intolerance, bowel/skin changes or CVS symptoms Free T4 elevated with wt loss clinic.  She believes her dose should be unchanged   Past Medical History:  Diagnosis Date  . Alcohol abuse   . Allergy   . Alopecia   . Arthritis   . Celiac disease   . Diverticulitis   . Edema of both lower extremities   . Essential hypertension 01/24/2018  . GERD (gastroesophageal reflux disease)   . Hypertension   . Hyperthyroidism   . Hypothyroidism 01/24/2018  . IBS (irritable bowel syndrome)   . Obesity (BMI 30-39.9) 04/01/2019  . Osteoarthritis   . Osteoporosis 04/01/2019  . Thyroid disease    Exam BP 132/80 (BP Location: Left Arm, Patient Position: Sitting, Cuff Size: Normal)   Pulse 70   Temp (!) 97.5 F (36.4 C) (Oral)   Ht 5' 2"  (1.575 m)   Wt 149 lb 2 oz (67.6 kg)   SpO2 98%   BMI 27.28 kg/m  General:  well developed, well nourished, in no apparent distress Heart: RRR, no bruits, no LE edema Lungs: clear to auscultation, no accessory muscle use Psych: well oriented with normal range of affect and appropriate judgment/insight  Essential hypertension  Hypothyroidism, unspecified type - Plan: TSH, T4, free  Osteoporosis, unspecified osteoporosis type, unspecified pathological fracture presence  1.  Continue hydrochlorothiazide 25 mg daily, lisinopril 30 mg daily.  Does not need to  monitor blood pressure at home.  Counseled on diet and exercise, she has done an excellent job. 2.   Continue current dosage of Synthroid 112 mcg daily.  I would like you to come back in 3 weeks to recheck her TSH and free T4 level as it was elevated at the weight loss clinic.  We will decrease to 100 mcg daily if still elevated.  She feels fine now. F/u in 6 months for physical or as needed. The patient voiced understanding and agreement to the plan.  [place], DO 04/07/20  11:25 AM

## 2020-04-07 NOTE — Patient Instructions (Signed)
Strong work with your weight loss.  Keep the diet clean and stay active.  Give [place] 2-3 business days to get the results of your labs back.   Let us know if you need anything.

## 2020-04-21 ENCOUNTER — Ambulatory Visit (INDEPENDENT_AMBULATORY_CARE_PROVIDER_SITE_OTHER): Payer: PPO | Admitting: Bariatrics

## 2020-04-21 ENCOUNTER — Encounter (INDEPENDENT_AMBULATORY_CARE_PROVIDER_SITE_OTHER): Payer: Self-pay | Admitting: Bariatrics

## 2020-04-21 ENCOUNTER — Other Ambulatory Visit: Payer: Self-pay

## 2020-04-21 VITALS — BP 136/82 | HR 70 | Temp 97.7°F | Ht 62.0 in | Wt 142.0 lb

## 2020-04-21 DIAGNOSIS — E7849 Other hyperlipidemia: Secondary | ICD-10-CM | POA: Diagnosis not present

## 2020-04-21 DIAGNOSIS — E038 Other specified hypothyroidism: Secondary | ICD-10-CM

## 2020-04-21 DIAGNOSIS — E669 Obesity, unspecified: Secondary | ICD-10-CM | POA: Diagnosis not present

## 2020-04-21 DIAGNOSIS — E559 Vitamin D deficiency, unspecified: Secondary | ICD-10-CM | POA: Diagnosis not present

## 2020-04-21 DIAGNOSIS — I10 Essential (primary) hypertension: Secondary | ICD-10-CM

## 2020-04-21 DIAGNOSIS — Z6830 Body mass index (BMI) 30.0-30.9, adult: Secondary | ICD-10-CM | POA: Diagnosis not present

## 2020-04-22 NOTE — Progress Notes (Signed)
Chief Complaint:   OBESITY Shannon Bond is here to discuss her progress with her obesity treatment plan along with follow-up of her obesity related diagnoses. Shannon Bond is on the Category 1 Plan and states she is following her eating plan approximately 100% of the time. Shannon Bond states she is doing cardio and strength training for 60 minutes 4 times per week.  Today's visit was #: 18 Starting weight: 183 lbs Starting date: 06/24/2019 Today's weight: 145 lbs Today's date: 04/21/2020 Total lbs lost to date: 38 lbs Total lbs lost since last in-office visit: 0  Interim History: Shannon Bond's weight is the same as at last visit.  Subjective:   1. Essential hypertension Review: taking medications as instructed, no medication side effects noted, no chest pain on exertion, no dyspnea on exertion, no swelling of ankles.  She is taking HCTZ and Zestril.  BP Readings from Last 3 Encounters:  04/21/20 136/82  04/07/20 132/80  04/06/20 136/74   2. Other hyperlipidemia Shannon Bond has hyperlipidemia and has been trying to improve her cholesterol levels with intensive lifestyle modification including a low saturated fat diet, exercise and weight loss. She denies any chest pain, claudication or myalgias.  She is taking Crestor.  Lab Results  Component Value Date   ALT 16 03/16/2020   AST 20 03/16/2020   ALKPHOS 47 03/16/2020   BILITOT 0.6 03/16/2020   Lab Results  Component Value Date   CHOL 120 10/11/2019   HDL 44.70 10/11/2019   LDLCALC 53 10/11/2019   TRIG 113.0 10/11/2019   CHOLHDL 3 10/11/2019   3. Vitamin D deficiency She is currently taking a multivitamin and vitamin D. She denies nausea, vomiting or muscle weakness.  Assessment/Plan:   1. Essential hypertension Shannon Bond is working on healthy weight loss and exercise to improve blood pressure control. We will watch for signs of hypotension as she continues her lifestyle modifications.  Continue medications.  2. Other  hyperlipidemia Cardiovascular risk and specific lipid/LDL goals reviewed.  We discussed several lifestyle modifications today and Shannon Bond will continue to work on diet, exercise and weight loss efforts. Orders and follow up as documented in patient record.  Continue Crestor.    Counseling Intensive lifestyle modifications are the first line treatment for this issue. . Dietary changes: Increase soluble fiber. Decrease simple carbohydrates. . Exercise changes: Moderate to vigorous-intensity aerobic activity 150 minutes per week if tolerated. . Lipid-lowering medications: see documented in medical record.  3. Vitamin D deficiency Low Vitamin D level contributes to fatigue and are associated with obesity, breast, and colon cancer. She agrees to continue to take prescription Vitamin D @50 ,000 IU every week and will follow-up for routine testing of Vitamin D, at least 2-3 times per year to avoid over-replacement.  She will continue taking calcium, vitamin D, and a multivitamin.  4. Class 1 obesity with serious comorbidity and body mass index (BMI) of 30.0 to 30.9 in adult, unspecified obesity type  Shannon Bond is currently in the action stage of change. As such, her goal is to continue with weight loss efforts. She has agreed to the Category 1 Plan.   She will work on meal planning and intentional eating.  We reviewed thyroid panel, CMP, vitamin D, B12, and CBC.  Exercise goals: Shannon Bond will continue exercise (yoga and walking).  Behavioral modification strategies: increasing lean protein intake, decreasing simple carbohydrates, increasing vegetables, increasing water intake, decreasing eating out, no skipping meals, meal planning and cooking strategies, keeping healthy foods in the home and planning for success.  Shannon Bond has agreed to follow-up with our clinic in 2-3 weeks. She was informed of the importance of frequent follow-up visits to maximize her success with intensive lifestyle modifications for her  multiple health conditions.   Objective:   Blood pressure 136/82, pulse 70, temperature 97.7 F (36.5 C), temperature source Oral, height 5' 2"  (1.575 m), weight 142 lb (64.4 kg), SpO2 98 %. Body mass index is 25.97 kg/m.  General: Cooperative, alert, well developed, in no acute distress. HEENT: Conjunctivae and lids unremarkable. Cardiovascular: Regular rhythm.  Lungs: Normal work of breathing. Neurologic: No focal deficits.   Lab Results  Component Value Date   CREATININE 1.07 (H) 03/16/2020   BUN 36 (H) 03/16/2020   NA 140 03/16/2020   K 4.9 03/16/2020   CL 107 (H) 03/16/2020   CO2 21 03/16/2020   Lab Results  Component Value Date   ALT 16 03/16/2020   AST 20 03/16/2020   ALKPHOS 47 03/16/2020   BILITOT 0.6 03/16/2020   Lab Results  Component Value Date   HGBA1C 5.1 06/24/2019   Lab Results  Component Value Date   INSULIN 5.6 06/24/2019   Lab Results  Component Value Date   TSH 0.739 03/16/2020   Lab Results  Component Value Date   CHOL 120 10/11/2019   HDL 44.70 10/11/2019   LDLCALC 53 10/11/2019   TRIG 113.0 10/11/2019   CHOLHDL 3 10/11/2019   Lab Results  Component Value Date   WBC 5.1 03/16/2020   HGB 12.8 03/16/2020   HCT 39.5 03/16/2020   MCV 99 (H) 03/16/2020   PLT 246 03/16/2020   Lab Results  Component Value Date   IRON 88 05/28/2019   FERRITIN 88.6 05/28/2019   Obesity Behavioral Intervention:   Approximately 15 minutes were spent on the discussion below.  ASK: We discussed the diagnosis of obesity with Shannon Bond today and Shannon Bond agreed to give [place] permission to discuss obesity behavioral modification therapy today.  ASSESS: Shannon Bond has the diagnosis of obesity and her BMI today is 26.7. Shannon Bond is in the action stage of change.   ADVISE: Shannon Bond was educated on the multiple health risks of obesity as well as the benefit of weight loss to improve her health. She was advised of the need for long term treatment and the importance of  lifestyle modifications to improve her current health and to decrease her risk of future health problems.  AGREE: Multiple dietary modification options and treatment options were discussed and Shannon Bond agreed to follow the recommendations documented in the above note.  ARRANGE: Shannon Bond was educated on the importance of frequent visits to treat obesity as outlined per CMS and USPSTF guidelines and agreed to schedule her next follow up appointment today.  Attestation Statements:   Reviewed by clinician on day of visit: allergies, medications, problem list, medical history, surgical history, family history, social history, and previous encounter notes.  I, Water quality scientist, CMA, am acting as Location manager for [employer], DO  I have reviewed the above documentation for accuracy and completeness, and I agree with the above. Jearld Lesch, DO

## 2020-04-28 ENCOUNTER — Other Ambulatory Visit: Payer: Self-pay

## 2020-04-29 ENCOUNTER — Other Ambulatory Visit: Payer: Self-pay

## 2020-04-29 ENCOUNTER — Other Ambulatory Visit (INDEPENDENT_AMBULATORY_CARE_PROVIDER_SITE_OTHER): Payer: PPO

## 2020-04-29 DIAGNOSIS — E039 Hypothyroidism, unspecified: Secondary | ICD-10-CM

## 2020-04-29 LAB — T4, FREE: Free T4: 1.11 ng/dL (ref 0.60–1.60)

## 2020-04-29 LAB — TSH: TSH: 1.91 u[IU]/mL (ref 0.35–4.50)

## 2020-04-30 ENCOUNTER — Other Ambulatory Visit: Payer: Self-pay | Admitting: Family Medicine

## 2020-05-05 ENCOUNTER — Encounter (INDEPENDENT_AMBULATORY_CARE_PROVIDER_SITE_OTHER): Payer: Self-pay | Admitting: Bariatrics

## 2020-05-05 ENCOUNTER — Telehealth (INDEPENDENT_AMBULATORY_CARE_PROVIDER_SITE_OTHER): Payer: PPO | Admitting: Bariatrics

## 2020-05-05 DIAGNOSIS — Z6830 Body mass index (BMI) 30.0-30.9, adult: Secondary | ICD-10-CM | POA: Diagnosis not present

## 2020-05-05 DIAGNOSIS — E038 Other specified hypothyroidism: Secondary | ICD-10-CM | POA: Diagnosis not present

## 2020-05-05 DIAGNOSIS — E669 Obesity, unspecified: Secondary | ICD-10-CM | POA: Diagnosis not present

## 2020-05-05 DIAGNOSIS — I10 Essential (primary) hypertension: Secondary | ICD-10-CM

## 2020-05-06 NOTE — Progress Notes (Signed)
TeleHealth Visit:  Due to the COVID-19 pandemic, this visit was completed with telemedicine (audio/video) technology to reduce patient and provider exposure as well as to preserve personal protective equipment.   Shannon Bond has verbally consented to this TeleHealth visit. The patient is located at home, the provider is located at the [facility] office. The participants in this visit include the listed provider and patient. The visit was conducted today via MyChart video.  Chief Complaint: OBESITY Shannon Bond is here to discuss her progress with her obesity treatment plan along with follow-up of her obesity related diagnoses. Shannon Bond is on the Category 1 Plan and states she is following her eating plan approximately 100% of the time. Shannon Bond states she is doing cardio and strength training for 60 minutes 4 times per week.  Today's visit was #: 39 Starting weight: 183 lbs Starting date: 06/24/2019  Interim History: Shannon Bond states that she is up 1 pound, but doing well overall.  She is supplementing with the protein powder.  Subjective:   1. Essential hypertension Controlled.  Review: taking medications as instructed, no medication side effects noted, no chest pain on exertion, no dyspnea on exertion, no swelling of ankles.  Taking medications as directed.  BP Readings from Last 3 Encounters:  04/21/20 136/82  04/07/20 132/80  04/06/20 136/74   2. Other specified hypothyroidism She is taking Synthroid 112 mcg daily.  PCP did not like her numbers.  Lab Results  Component Value Date   TSH 1.91 04/29/2020   Assessment/Plan:   1. Essential hypertension Shannon Bond is working on healthy weight loss and exercise to improve blood pressure control. We will watch for signs of hypotension as she continues her lifestyle modifications.  Continue medications.  2. Other specified hypothyroidism Patient with long-standing hypothyroidism, on levothyroxine therapy. She appears euthyroid. Orders  and follow up as documented in patient record.  Continue medications.  Follow-up with PCP.  Counseling . Good thyroid control is important for overall health. Supratherapeutic thyroid levels are dangerous and will not improve weight loss results. . The correct way to take levothyroxine is fasting, with water, separated by at least 30 minutes from breakfast, and separated by more than 4 hours from calcium, iron, multivitamins, acid reflux medications (PPIs).   3. Class 1 obesity with serious comorbidity and body mass index (BMI) of 30.0 to 30.9 in adult, unspecified obesity type  Shannon Bond is currently in the action stage of change. As such, her goal is to continue with weight loss efforts. She has agreed to the Category 1 Plan.   She is working on meal planning and intentional eating.  Exercise goals: Cardio/strength training for 60 minutes 4 times per week.  Behavioral modification strategies: increasing lean protein intake, decreasing simple carbohydrates, increasing vegetables, increasing water intake, decreasing eating out, no skipping meals, meal planning and cooking strategies, keeping healthy foods in the home and planning for success.  Shannon Bond has agreed to follow-up with our clinic on 05/26/2020. She was informed of the importance of frequent follow-up visits to maximize her success with intensive lifestyle modifications for her multiple health conditions.  Objective:   VITALS: Per patient if applicable, see vitals. GENERAL: Alert and in no acute distress. CARDIOPULMONARY: No increased WOB. Speaking in clear sentences.  PSYCH: Pleasant and cooperative. Speech normal rate and rhythm. Affect is appropriate. Insight and judgement are appropriate. Attention is focused, linear, and appropriate.  NEURO: Oriented as arrived to appointment on time with no prompting.   Lab Results  Component  Value Date   CREATININE 1.07 (H) 03/16/2020   BUN 36 (H) 03/16/2020   NA 140 03/16/2020   K 4.9  03/16/2020   CL 107 (H) 03/16/2020   CO2 21 03/16/2020   Lab Results  Component Value Date   ALT 16 03/16/2020   AST 20 03/16/2020   ALKPHOS 47 03/16/2020   BILITOT 0.6 03/16/2020   Lab Results  Component Value Date   HGBA1C 5.1 06/24/2019   Lab Results  Component Value Date   INSULIN 5.6 06/24/2019   Lab Results  Component Value Date   TSH 1.91 04/29/2020   Lab Results  Component Value Date   CHOL 120 10/11/2019   HDL 44.70 10/11/2019   LDLCALC 53 10/11/2019   TRIG 113.0 10/11/2019   CHOLHDL 3 10/11/2019   Lab Results  Component Value Date   WBC 5.1 03/16/2020   HGB 12.8 03/16/2020   HCT 39.5 03/16/2020   MCV 99 (H) 03/16/2020   PLT 246 03/16/2020   Lab Results  Component Value Date   IRON 88 05/28/2019   FERRITIN 88.6 05/28/2019   Attestation Statements:   Reviewed by clinician on day of visit: allergies, medications, problem list, medical history, surgical history, family history, social history, and previous encounter notes.  I, Water quality scientist, CMA, am acting as Location manager for [employer], DO  I have reviewed the above documentation for accuracy and completeness, and I agree with the above. Jearld Lesch, DO

## 2020-05-07 ENCOUNTER — Encounter (INDEPENDENT_AMBULATORY_CARE_PROVIDER_SITE_OTHER): Payer: Self-pay | Admitting: Bariatrics

## 2020-05-13 ENCOUNTER — Other Ambulatory Visit: Payer: Self-pay

## 2020-05-13 ENCOUNTER — Other Ambulatory Visit (HOSPITAL_BASED_OUTPATIENT_CLINIC_OR_DEPARTMENT_OTHER): Payer: Self-pay | Admitting: Family Medicine

## 2020-05-13 ENCOUNTER — Ambulatory Visit (HOSPITAL_BASED_OUTPATIENT_CLINIC_OR_DEPARTMENT_OTHER)
Admission: RE | Admit: 2020-05-13 | Discharge: 2020-05-13 | Disposition: A | Discharge status: Home / Self Care | Payer: PPO | Source: Ambulatory Visit | Attending: Family Medicine | Admitting: Family Medicine

## 2020-05-13 ENCOUNTER — Encounter (HOSPITAL_BASED_OUTPATIENT_CLINIC_OR_DEPARTMENT_OTHER): Payer: Self-pay

## 2020-05-13 DIAGNOSIS — Z1231 Encounter for screening mammogram for malignant neoplasm of breast: Secondary | ICD-10-CM | POA: Insufficient documentation

## 2020-05-20 ENCOUNTER — Other Ambulatory Visit: Payer: Self-pay | Admitting: Family Medicine

## 2020-05-26 ENCOUNTER — Other Ambulatory Visit: Payer: Self-pay

## 2020-05-26 ENCOUNTER — Encounter (INDEPENDENT_AMBULATORY_CARE_PROVIDER_SITE_OTHER): Payer: Self-pay | Admitting: Bariatrics

## 2020-05-26 ENCOUNTER — Ambulatory Visit (INDEPENDENT_AMBULATORY_CARE_PROVIDER_SITE_OTHER): Payer: PPO | Admitting: Bariatrics

## 2020-05-26 VITALS — BP 135/78 | HR 76 | Temp 97.6°F | Ht 62.0 in | Wt 145.0 lb

## 2020-05-26 DIAGNOSIS — Z6830 Body mass index (BMI) 30.0-30.9, adult: Secondary | ICD-10-CM | POA: Diagnosis not present

## 2020-05-26 DIAGNOSIS — E669 Obesity, unspecified: Secondary | ICD-10-CM | POA: Diagnosis not present

## 2020-05-26 DIAGNOSIS — E7849 Other hyperlipidemia: Secondary | ICD-10-CM

## 2020-05-26 DIAGNOSIS — E559 Vitamin D deficiency, unspecified: Secondary | ICD-10-CM | POA: Diagnosis not present

## 2020-05-28 NOTE — Progress Notes (Signed)
Chief Complaint:   OBESITY Shannon Bond is here to discuss her progress with her obesity treatment plan along with follow-up of her obesity related diagnoses. Shannon Bond is on the Category 1 Plan and states she is following her eating plan approximately 100% of the time. Shannon Bond states she is doing cardio and strength training for 60 minutes 4 times per week.  Today's visit was #: 20 Starting weight: 183 lbs Starting date: 06/24/2019 Today's weight: 145 lbs Today's date: 05/26/2020 Total lbs lost to date: 38 lbs Total lbs lost since last in-office visit: 0  Interim History: Shannon Bond's weight remains the same as at her last visit.  Subjective:   1. Other hyperlipidemia Shannon Bond has hyperlipidemia and has been trying to improve her cholesterol levels with intensive lifestyle modification including a low saturated fat diet, exercise and weight loss. She denies any chest pain, claudication or myalgias.  She is taking Crestor 20 mg daily.  Lab Results  Component Value Date   ALT 16 03/16/2020   AST 20 03/16/2020   ALKPHOS 47 03/16/2020   BILITOT 0.6 03/16/2020   Lab Results  Component Value Date   CHOL 120 10/11/2019   HDL 44.70 10/11/2019   LDLCALC 53 10/11/2019   TRIG 113.0 10/11/2019   CHOLHDL 3 10/11/2019   2. Vitamin D deficiency Shannon Bond's Vitamin D level was 89.6 on 03/16/2020. She is currently taking OTC vitamin D each day. She denies nausea, vomiting or muscle weakness.  She gets minimal sun exposure.  Assessment/Plan:   1. Other hyperlipidemia Cardiovascular risk and specific lipid/LDL goals reviewed.  We discussed several lifestyle modifications today and Shannon Bond will continue to work on diet, exercise and weight loss efforts. Orders and follow up as documented in patient record.  Continue Crestor.  Counseling Intensive lifestyle modifications are the first line treatment for this issue. . Dietary changes: Increase soluble fiber. Decrease simple carbohydrates. . Exercise  changes: Moderate to vigorous-intensity aerobic activity 150 minutes per week if tolerated. . Lipid-lowering medications: see documented in medical record.  2. Vitamin D deficiency Low Vitamin D level contributes to fatigue and are associated with obesity, breast, and colon cancer. She agrees to continue OTC vitamin D.  3. Class 1 obesity with serious comorbidity and body mass index (BMI) of 30.0 to 30.9 in adult, unspecified obesity type  Shannon Bond is currently in the action stage of change. As such, her goal is to continue with weight loss efforts. She has agreed to the Category 1 Plan.   She will work on meal planning and intentional eating.  Exercise goals: Continue exercise.  Behavioral modification strategies: increasing lean protein intake, decreasing simple carbohydrates, increasing vegetables, increasing water intake, decreasing eating out, no skipping meals, meal planning and cooking strategies and keeping healthy foods in the home.  Shannon Bond has agreed to follow-up with our clinic in 3-4 weeks. She was informed of the importance of frequent follow-up visits to maximize her success with intensive lifestyle modifications for her multiple health conditions.   Objective:   Blood pressure 135/78, pulse 76, temperature 97.6 F (36.4 C), height 5' 2"  (1.575 m), weight 145 lb (65.8 kg), SpO2 99 %. Body mass index is 26.52 kg/m.  General: Cooperative, alert, well developed, in no acute distress. HEENT: Conjunctivae and lids unremarkable. Cardiovascular: Regular rhythm.  Lungs: Normal work of breathing. Neurologic: No focal deficits.   Lab Results  Component Value Date   CREATININE 1.07 (H) 03/16/2020   BUN 36 (H) 03/16/2020   NA 140 03/16/2020  K 4.9 03/16/2020   CL 107 (H) 03/16/2020   CO2 21 03/16/2020   Lab Results  Component Value Date   ALT 16 03/16/2020   AST 20 03/16/2020   ALKPHOS 47 03/16/2020   BILITOT 0.6 03/16/2020   Lab Results  Component Value Date    HGBA1C 5.1 06/24/2019   Lab Results  Component Value Date   INSULIN 5.6 06/24/2019   Lab Results  Component Value Date   TSH 1.91 04/29/2020   Lab Results  Component Value Date   CHOL 120 10/11/2019   HDL 44.70 10/11/2019   LDLCALC 53 10/11/2019   TRIG 113.0 10/11/2019   CHOLHDL 3 10/11/2019   Lab Results  Component Value Date   WBC 5.1 03/16/2020   HGB 12.8 03/16/2020   HCT 39.5 03/16/2020   MCV 99 (H) 03/16/2020   PLT 246 03/16/2020   Lab Results  Component Value Date   IRON 88 05/28/2019   FERRITIN 88.6 05/28/2019   Obesity Behavioral Intervention:   Approximately 15 minutes were spent on the discussion below.  ASK: We discussed the diagnosis of obesity with Shannon Bond today and Shannon Bond agreed to give [place] permission to discuss obesity behavioral modification therapy today.  ASSESS: Shannon Bond has the diagnosis of obesity and her BMI today is 26.6. Shannon Bond is in the action stage of change.   ADVISE: Shannon Bond was educated on the multiple health risks of obesity as well as the benefit of weight loss to improve her health. She was advised of the need for long term treatment and the importance of lifestyle modifications to improve her current health and to decrease her risk of future health problems.  AGREE: Multiple dietary modification options and treatment options were discussed and Shannon Bond agreed to follow the recommendations documented in the above note.  ARRANGE: Shannon Bond was educated on the importance of frequent visits to treat obesity as outlined per CMS and USPSTF guidelines and agreed to schedule her next follow up appointment today.  Attestation Statements:   Reviewed by clinician on day of visit: allergies, medications, problem list, medical history, surgical history, family history, social history, and previous encounter notes.  I, Water quality scientist, CMA, am acting as Location manager for [employer], DO  I have reviewed the above documentation for accuracy and  completeness, and I agree with the above. Jearld Lesch, DO

## 2020-05-30 ENCOUNTER — Encounter (INDEPENDENT_AMBULATORY_CARE_PROVIDER_SITE_OTHER): Payer: Self-pay | Admitting: Bariatrics

## 2020-06-07 IMAGING — DX DG CHEST 2V
2 series · 2 of 2 positions shown · non-contrast
Comparison: 07/02/2019

CLINICAL DATA: Dyspnea, chest tightness and shortness of breath

EXAM:
CHEST - 2 VIEW

[chest pa]
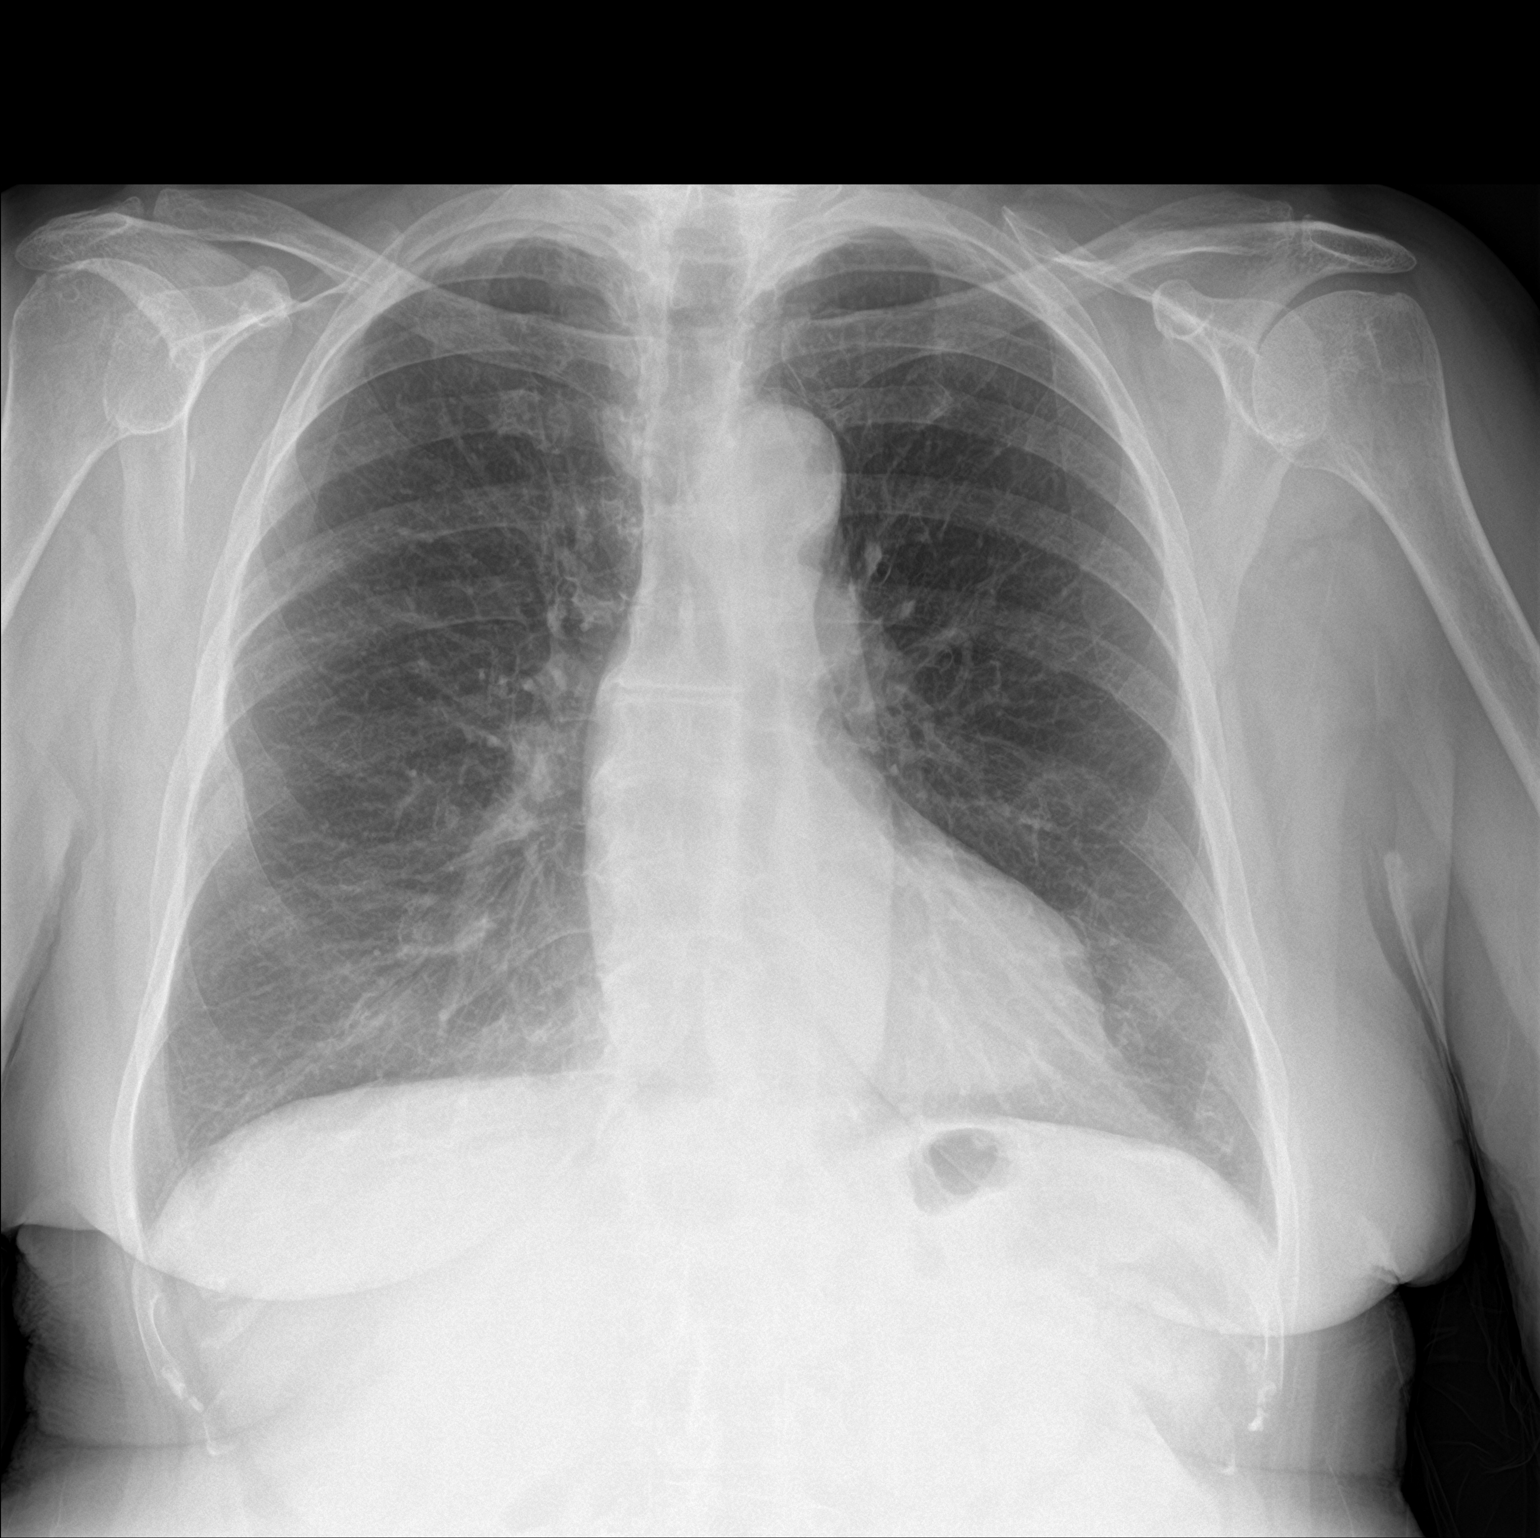

[chest lat]
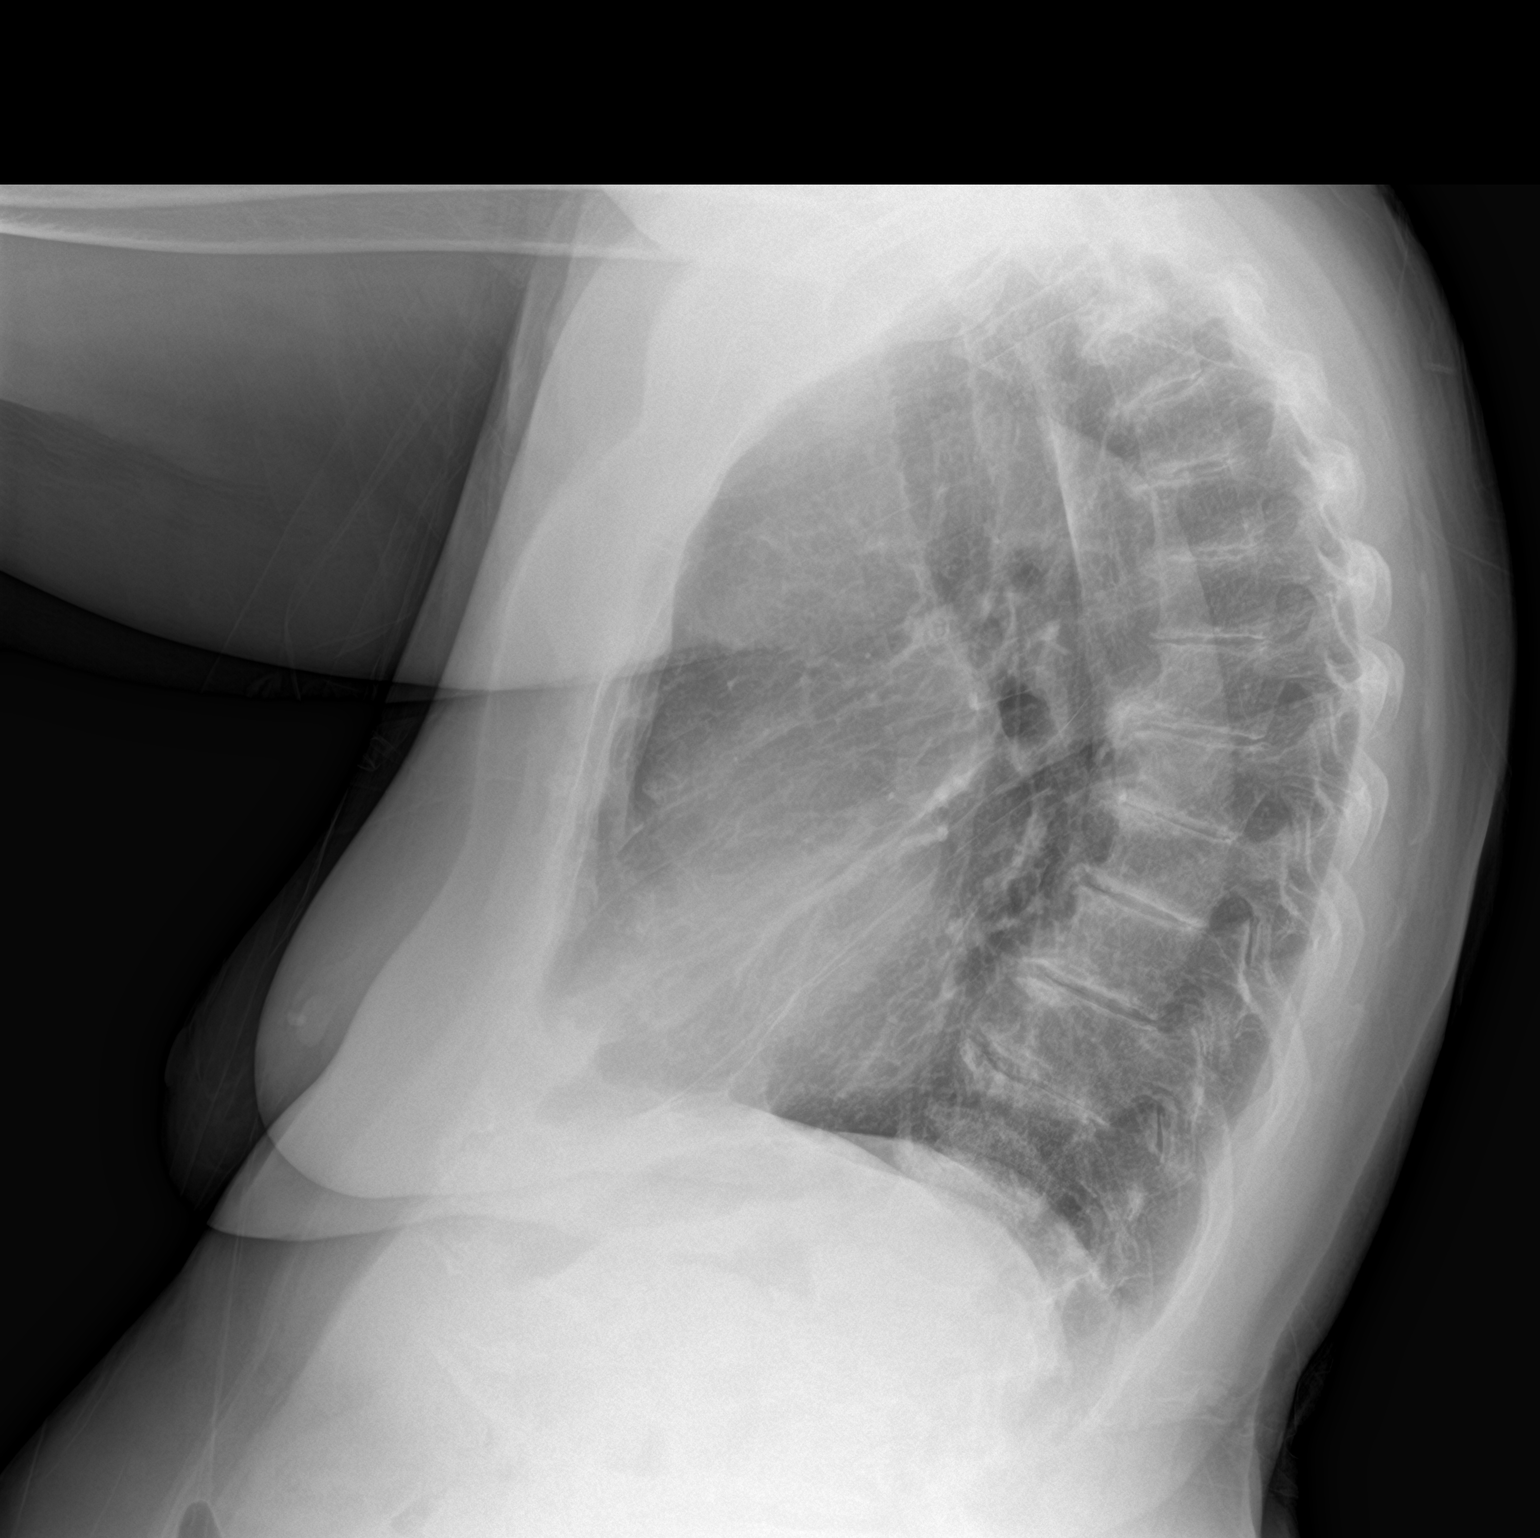

[2 of 2 positions shown; findings below may reference images not displayed]

FINDINGS: The heart size and mediastinal contours are within normal limits.
Both lungs are clear. Disc degenerative disease of the thoracic
spine.
IMPRESSION: No acute abnormality of the lungs.

## 2020-06-22 ENCOUNTER — Encounter (INDEPENDENT_AMBULATORY_CARE_PROVIDER_SITE_OTHER): Payer: Self-pay | Admitting: Bariatrics

## 2020-06-22 ENCOUNTER — Other Ambulatory Visit: Payer: Self-pay

## 2020-06-22 ENCOUNTER — Ambulatory Visit (INDEPENDENT_AMBULATORY_CARE_PROVIDER_SITE_OTHER): Payer: PPO | Admitting: Bariatrics

## 2020-06-22 VITALS — BP 136/78 | HR 76 | Temp 97.6°F | Ht 62.0 in | Wt 145.0 lb

## 2020-06-22 DIAGNOSIS — E559 Vitamin D deficiency, unspecified: Secondary | ICD-10-CM

## 2020-06-22 DIAGNOSIS — E669 Obesity, unspecified: Secondary | ICD-10-CM

## 2020-06-22 DIAGNOSIS — E039 Hypothyroidism, unspecified: Secondary | ICD-10-CM | POA: Diagnosis not present

## 2020-06-22 DIAGNOSIS — Z6830 Body mass index (BMI) 30.0-30.9, adult: Secondary | ICD-10-CM

## 2020-06-22 DIAGNOSIS — E6609 Other obesity due to excess calories: Secondary | ICD-10-CM

## 2020-06-22 DIAGNOSIS — I10 Essential (primary) hypertension: Secondary | ICD-10-CM

## 2020-06-22 DIAGNOSIS — E66811 Obesity, class 1: Secondary | ICD-10-CM

## 2020-06-23 ENCOUNTER — Encounter (INDEPENDENT_AMBULATORY_CARE_PROVIDER_SITE_OTHER): Payer: Self-pay | Admitting: Bariatrics

## 2020-06-23 LAB — COMPREHENSIVE METABOLIC PANEL
ALT: 16 IU/L (ref 0–32)
AST: 22 IU/L (ref 0–40)
Albumin/Globulin Ratio: 1.8 (ref 1.2–2.2)
Albumin: 4.6 g/dL (ref 3.7–4.7)
Alkaline Phosphatase: 46 IU/L (ref 44–121)
BUN/Creatinine Ratio: 51 — ABNORMAL HIGH (ref 12–28)
BUN: 52 mg/dL — ABNORMAL HIGH (ref 8–27)
Bilirubin Total: 0.5 mg/dL (ref 0.0–1.2)
CO2: 21 mmol/L (ref 20–29)
Calcium: 9.8 mg/dL (ref 8.7–10.3)
Chloride: 102 mmol/L (ref 96–106)
Creatinine, Ser: 1.01 mg/dL — ABNORMAL HIGH (ref 0.57–1.00)
Globulin, Total: 2.5 g/dL (ref 1.5–4.5)
Glucose: 85 mg/dL (ref 65–99)
Potassium: 4.7 mmol/L (ref 3.5–5.2)
Sodium: 138 mmol/L (ref 134–144)
Total Protein: 7.1 g/dL (ref 6.0–8.5)
eGFR: 57 mL/min/{1.73_m2} — ABNORMAL LOW (ref >59)

## 2020-06-23 LAB — VITAMIN D 25 HYDROXY (VIT D DEFICIENCY, FRACTURES): Vit D, 25-Hydroxy: 75.6 ng/mL (ref 30.0–100.0)

## 2020-06-23 NOTE — Progress Notes (Signed)
Chief Complaint:   OBESITY Shannon Bond is here to discuss her progress with her obesity treatment plan along with follow-up of her obesity related diagnoses. Shannon Bond is on the Category 1 Plan and states she is following her eating plan approximately 100% of the time. Shannon Bond states she is doing cardio/strength training for 3 hours 1 time per week and walking for 45 minutes minutes 7 times per week.  Today's visit was #: 21 Starting weight: 183 lbs Starting date: 06/24/2019 Today's weight: 145 lbs Today's date: 06/22/2020 Total lbs lost to date: 38 lbs Total lbs lost since last in-office visit: 0  Interim History: Shannon Bond's weight remains the same and is at her goal weight.  She is adding in a small amount of calories.  She has added more peanut butter and has added real cheese.  Subjective:   1. Hypothyroidism, unspecified type Taking Synthroid.  Stable.  Lab Results  Component Value Date   TSH 1.91 04/29/2020   2. Essential hypertension Controlled.  Review: taking medications as instructed, no medication side effects noted, no chest pain on exertion, no dyspnea on exertion, no swelling of ankles.     BP Readings from Last 3 Encounters:  06/22/20 136/78  05/26/20 135/78  04/21/20 136/82   3. Vitamin D deficiency Shannon Bond's Vitamin D level was 89.6 on 03/16/2020.  She is taking vitamin D.   Assessment/Plan:   1. Hypothyroidism, unspecified type Patient with long-standing hypothyroidism, on levothyroxine therapy. She appears euthyroid. Orders and follow up as documented in patient record.  Continue medication.  Counseling . Good thyroid control is important for overall health. Supratherapeutic thyroid levels are dangerous and will not improve weight loss results. . The correct way to take levothyroxine is fasting, with water, separated by at least 30 minutes from breakfast, and separated by more than 4 hours from calcium, iron, multivitamins, acid reflux medications (PPIs).   2.  Essential hypertension Shannon Bond is working on healthy weight loss and exercise to improve blood pressure control. We will watch for signs of hypotension as she continues her lifestyle modifications.  Continue medications.  Will check CMP today.  - Comprehensive metabolic panel  3. Vitamin D deficiency Low Vitamin D level contributes to fatigue and are associated with obesity, breast, and colon cancer.  Will check vitamin D level today.  - VITAMIN D 25 Hydroxy (Vit-D Deficiency, Fractures)  4. Class 1 obesity with serious comorbidity and body mass index (BMI) of 30.0 to 30.9 in adult, unspecified obesity type  Shannon Bond is currently in the action stage of change. As such, her goal is to continue with weight loss efforts. She has agreed to the Category 1 Plan.   She will work on meal planning, keeping protein and calories high, will monitor her weight at home, and has added in a small amount of calories.  Exercise goals: Cardio/strength training/walking.  Behavioral modification strategies: increasing lean protein intake, decreasing simple carbohydrates, increasing vegetables, increasing water intake, decreasing eating out, no skipping meals, meal planning and cooking strategies, keeping healthy foods in the home and planning for success.  Shannon Bond has agreed to follow-up with our clinic in 4 weeks. She was informed of the importance of frequent follow-up visits to maximize her success with intensive lifestyle modifications for her multiple health conditions.   Objective:   Blood pressure 136/78, pulse 76, temperature 97.6 F (36.4 C), height 5' 2"  (1.575 m), weight 145 lb (65.8 kg), SpO2 99 %. Body mass index is 26.52 kg/m.  General: Cooperative,  alert, well developed, in no acute distress. HEENT: Conjunctivae and lids unremarkable. Cardiovascular: Regular rhythm.  Lungs: Normal work of breathing. Neurologic: No focal deficits.   Lab Results  Component Value Date   HGBA1C 5.1 06/24/2019    Lab Results  Component Value Date   INSULIN 5.6 06/24/2019   Lab Results  Component Value Date   TSH 1.91 04/29/2020   Lab Results  Component Value Date   CHOL 120 10/11/2019   HDL 44.70 10/11/2019   LDLCALC 53 10/11/2019   TRIG 113.0 10/11/2019   CHOLHDL 3 10/11/2019   Lab Results  Component Value Date   WBC 5.1 03/16/2020   HGB 12.8 03/16/2020   HCT 39.5 03/16/2020   MCV 99 (H) 03/16/2020   PLT 246 03/16/2020   Lab Results  Component Value Date   IRON 88 05/28/2019   FERRITIN 88.6 05/28/2019   Obesity Behavioral Intervention:   Approximately 15 minutes were spent on the discussion below.  ASK: We discussed the diagnosis of obesity with Shannon Bond today and Shannon Bond agreed to give [place] permission to discuss obesity behavioral modification therapy today.  ASSESS: Shannon Bond has the diagnosis of obesity and her BMI today is 26.6. Shannon Bond is in the action stage of change.   ADVISE: Shannon Bond was educated on the multiple health risks of obesity as well as the benefit of weight loss to improve her health. She was advised of the need for long term treatment and the importance of lifestyle modifications to improve her current health and to decrease her risk of future health problems.  AGREE: Multiple dietary modification options and treatment options were discussed and Shannon Bond agreed to follow the recommendations documented in the above note.  ARRANGE: Shannon Bond was educated on the importance of frequent visits to treat obesity as outlined per CMS and USPSTF guidelines and agreed to schedule her next follow up appointment today.  Attestation Statements:   Reviewed by clinician on day of visit: allergies, medications, problem list, medical history, surgical history, family history, social history, and previous encounter notes.  I, Water quality scientist, CMA, am acting as Location manager for [employer], DO  I have reviewed the above documentation for accuracy and completeness, and I agree with  the above. Jearld Lesch, DO

## 2020-07-09 ENCOUNTER — Other Ambulatory Visit: Payer: Self-pay | Admitting: Family Medicine

## 2020-07-09 DIAGNOSIS — I10 Essential (primary) hypertension: Secondary | ICD-10-CM

## 2020-07-20 ENCOUNTER — Encounter (INDEPENDENT_AMBULATORY_CARE_PROVIDER_SITE_OTHER): Payer: Self-pay | Admitting: Bariatrics

## 2020-07-20 ENCOUNTER — Ambulatory Visit (INDEPENDENT_AMBULATORY_CARE_PROVIDER_SITE_OTHER): Payer: PPO | Admitting: Bariatrics

## 2020-07-20 ENCOUNTER — Other Ambulatory Visit: Payer: Self-pay

## 2020-07-20 VITALS — BP 125/72 | HR 77 | Temp 97.6°F | Ht 62.0 in | Wt 146.0 lb

## 2020-07-20 DIAGNOSIS — I10 Essential (primary) hypertension: Secondary | ICD-10-CM

## 2020-07-20 DIAGNOSIS — E669 Obesity, unspecified: Secondary | ICD-10-CM | POA: Diagnosis not present

## 2020-07-20 DIAGNOSIS — E038 Other specified hypothyroidism: Secondary | ICD-10-CM | POA: Diagnosis not present

## 2020-07-20 DIAGNOSIS — Z6833 Body mass index (BMI) 33.0-33.9, adult: Secondary | ICD-10-CM

## 2020-07-22 ENCOUNTER — Encounter (INDEPENDENT_AMBULATORY_CARE_PROVIDER_SITE_OTHER): Payer: Self-pay | Admitting: Bariatrics

## 2020-07-22 NOTE — Progress Notes (Signed)
Chief Complaint:   OBESITY Shannon Bond is here to discuss her progress with her obesity treatment plan along with follow-up of her obesity related diagnoses. Shannon Bond is on the Category 1 Plan and states she is following her eating plan approximately 100% of the time. Shannon Bond states she is doing cardio and strength training 60-90 minutes 5 times per week.  Today's visit was #: 22 Starting weight: 183 lbs Starting date: 06/24/2019 Today's weight: 146 lbs Today's date: 07/20/2020 Total lbs lost to date: 37 lbs Total lbs lost since last in-office visit: 0  Interim History: Shannon Bond is up 1 lb. She has good journaling with her meals.  Subjective:   1. Essential hypertension Darenda is taking HCTZ and Zestril.  BP Readings from Last 3 Encounters:  07/20/20 125/72  06/22/20 136/78  05/26/20 135/78    2. Other specified hypothyroidism Shannon Bond is taking Synthroid.   Lab Results  Component Value Date   TSH 1.91 04/29/2020    Assessment/Plan:   1. Essential hypertension Shannon Bond is working on healthy weight loss and exercise to improve blood pressure control. We will watch for signs of hypotension as she continues her lifestyle modifications. Continue current treatment plan.  2. Other specified hypothyroidism Patient with long-standing hypothyroidism, on levothyroxine therapy. She appears euthyroid. Orders and follow up as documented in patient record. Continue Synthroid.  Counseling . Good thyroid control is important for overall health. Supratherapeutic thyroid levels are dangerous and will not improve weight loss results. . The correct way to take levothyroxine is fasting, with water, separated by at least 30 minutes from breakfast, and separated by more than 4 hours from calcium, iron, multivitamins, acid reflux medications (PPIs).   3. Obesity, current BMI 26 Shannon Bond is currently in the action stage of change. As such, her goal is to continue with weight loss efforts. She has agreed to  the Category 1 Plan.   06/22/2020 labs reviewed with pt.  Exercise goals: As is  Behavioral modification strategies: increasing lean protein intake, decreasing simple carbohydrates, increasing vegetables, increasing water intake, decreasing eating out, no skipping meals, meal planning and cooking strategies, keeping healthy foods in the home and planning for success.  Shannon Bond has agreed to follow-up with our clinic in 4 weeks. She was informed of the importance of frequent follow-up visits to maximize her success with intensive lifestyle modifications for her multiple health conditions.   Objective:   Blood pressure 125/72, pulse 77, temperature 97.6 F (36.4 C), height 5' 2"  (1.575 m), weight 146 lb (66.2 kg), SpO2 (!) 10 %. Body mass index is 26.7 kg/m.  General: Cooperative, alert, well developed, in no acute distress. HEENT: Conjunctivae and lids unremarkable. Cardiovascular: Regular rhythm.  Lungs: Normal work of breathing. Neurologic: No focal deficits.   Lab Results  Component Value Date   CREATININE 1.01 (H) 06/22/2020   BUN 52 (H) 06/22/2020   NA 138 06/22/2020   K 4.7 06/22/2020   CL 102 06/22/2020   CO2 21 06/22/2020   Lab Results  Component Value Date   ALT 16 06/22/2020   AST 22 06/22/2020   ALKPHOS 46 06/22/2020   BILITOT 0.5 06/22/2020   Lab Results  Component Value Date   HGBA1C 5.1 06/24/2019   Lab Results  Component Value Date   INSULIN 5.6 06/24/2019   Lab Results  Component Value Date   TSH 1.91 04/29/2020   Lab Results  Component Value Date   CHOL 120 10/11/2019   HDL 44.70 10/11/2019   [place]  53 10/11/2019   TRIG 113.0 10/11/2019   CHOLHDL 3 10/11/2019   Lab Results  Component Value Date   WBC 5.1 03/16/2020   HGB 12.8 03/16/2020   HCT 39.5 03/16/2020   MCV 99 (H) 03/16/2020   PLT 246 03/16/2020   Lab Results  Component Value Date   IRON 88 05/28/2019   FERRITIN 88.6 05/28/2019    Obesity Behavioral Intervention:    Approximately 15 minutes were spent on the discussion below.  ASK: We discussed the diagnosis of obesity with Shannon Bond today and Shannon Bond agreed to give [place] permission to discuss obesity behavioral modification therapy today.  ASSESS: Shannon Bond has the diagnosis of obesity and her BMI today is 26.7. Shannon Bond is in the action stage of change.   ADVISE: Shannon Bond was educated on the multiple health risks of obesity as well as the benefit of weight loss to improve her health. She was advised of the need for long term treatment and the importance of lifestyle modifications to improve her current health and to decrease her risk of future health problems.  AGREE: Multiple dietary modification options and treatment options were discussed and Shannon Bond agreed to follow the recommendations documented in the above note.  ARRANGE: Shannon Bond was educated on the importance of frequent visits to treat obesity as outlined per CMS and USPSTF guidelines and agreed to schedule her next follow up appointment today.  Attestation Statements:   Reviewed by clinician on day of visit: allergies, medications, problem list, medical history, surgical history, family history, social history, and previous encounter notes.  Coral Ceo, am acting as Location manager for [employer], DO.  I have reviewed the above documentation for accuracy and completeness, and I agree with the above. Jearld Lesch, DO

## 2020-07-24 ENCOUNTER — Other Ambulatory Visit: Payer: Self-pay | Admitting: Family Medicine

## 2020-08-17 ENCOUNTER — Other Ambulatory Visit: Payer: Self-pay

## 2020-08-17 ENCOUNTER — Ambulatory Visit (INDEPENDENT_AMBULATORY_CARE_PROVIDER_SITE_OTHER): Payer: PPO | Admitting: Bariatrics

## 2020-08-17 ENCOUNTER — Encounter (INDEPENDENT_AMBULATORY_CARE_PROVIDER_SITE_OTHER): Payer: Self-pay | Admitting: Bariatrics

## 2020-08-17 VITALS — BP 138/76 | HR 73 | Temp 97.7°F | Ht 62.0 in | Wt 143.0 lb

## 2020-08-17 DIAGNOSIS — I10 Essential (primary) hypertension: Secondary | ICD-10-CM | POA: Diagnosis not present

## 2020-08-17 DIAGNOSIS — E7849 Other hyperlipidemia: Secondary | ICD-10-CM

## 2020-08-17 DIAGNOSIS — E669 Obesity, unspecified: Secondary | ICD-10-CM

## 2020-08-18 NOTE — Progress Notes (Signed)
Chief Complaint:   OBESITY Shannon Bond is here to discuss her progress with her obesity treatment plan along with follow-up of her obesity related diagnoses. Shannon Bond is on the Category 1 Plan and states she is following her eating plan approximately 100% of the time. Shannon Bond states she is doing cardio and strengthening for 60-90 minutes 5 times per week.  Today's visit was #: 23 Starting weight: 183 lbs Starting date: 06/24/2019 Today's weight: 143 lbs Today's date: 08/17/2020 Total lbs lost to date: 40 Total lbs lost since last in-office visit: 3  Interim History: Shannon Bond is down 3 lbs and she has done exceptionally well. Today is her birthday.  Subjective:   1. Other hyperlipidemia Shannon Bond is taking Crestor currently.  2. Essential hypertension Shannon Bond is taking her medications as prescribed, and her blood pressure is well controlled.  Assessment/Plan:   1. Other hyperlipidemia Cardiovascular risk and specific lipid/LDL goals reviewed. We discussed several lifestyle modifications today. Shannon Bond will continue Crestor, and will continue to work on diet, exercise and weight loss efforts. Orders and follow up as documented in patient record.   Counseling Intensive lifestyle modifications are the first line treatment for this issue. . Dietary changes: Increase soluble fiber. Decrease simple carbohydrates. . Exercise changes: Moderate to vigorous-intensity aerobic activity 150 minutes per week if tolerated. . Lipid-lowering medications: see documented in medical record.  2. Essential hypertension Shannon Bond will continue her medications, and will continue working on healthy weight loss and exercise to improve blood pressure control. We will watch for signs of hypotension as she continues her lifestyle modifications.  3. Obesity with current BMI of 26 Shannon Bond is currently in the action stage of change. As such, her goal is to continue with weight loss efforts. She has agreed to the Category 1  Plan.   Intentional eating was discussed.  Exercise goals: As is.  Behavioral modification strategies: increasing lean protein intake, decreasing simple carbohydrates, increasing vegetables, increasing water intake, decreasing eating out, no skipping meals, meal planning and cooking strategies, keeping healthy foods in the home and planning for success.  Shannon Bond has agreed to follow-up with our clinic in 4 weeks. She was informed of the importance of frequent follow-up visits to maximize her success with intensive lifestyle modifications for her multiple health conditions.   Objective:   Blood pressure 138/76, pulse 73, temperature 97.7 F (36.5 C), height 5' 2"  (1.575 m), weight 143 lb (64.9 kg), SpO2 98 %. Body mass index is 26.16 kg/m.  General: Cooperative, alert, well developed, in no acute distress. HEENT: Conjunctivae and lids unremarkable. Cardiovascular: Regular rhythm.  Lungs: Normal work of breathing. Neurologic: No focal deficits.   Lab Results  Component Value Date   CREATININE 1.01 (H) 06/22/2020   BUN 52 (H) 06/22/2020   NA 138 06/22/2020   K 4.7 06/22/2020   CL 102 06/22/2020   CO2 21 06/22/2020   Lab Results  Component Value Date   ALT 16 06/22/2020   AST 22 06/22/2020   ALKPHOS 46 06/22/2020   BILITOT 0.5 06/22/2020   Lab Results  Component Value Date   HGBA1C 5.1 06/24/2019   Lab Results  Component Value Date   INSULIN 5.6 06/24/2019   Lab Results  Component Value Date   TSH 1.91 04/29/2020   Lab Results  Component Value Date   CHOL 120 10/11/2019   HDL 44.70 10/11/2019   LDLCALC 53 10/11/2019   TRIG 113.0 10/11/2019   CHOLHDL 3 10/11/2019   Lab Results  Component  Value Date   WBC 5.1 03/16/2020   HGB 12.8 03/16/2020   HCT 39.5 03/16/2020   MCV 99 (H) 03/16/2020   PLT 246 03/16/2020   Lab Results  Component Value Date   IRON 88 05/28/2019   FERRITIN 88.6 05/28/2019    Obesity Behavioral Intervention:   Approximately 15  minutes were spent on the discussion below.  ASK: We discussed the diagnosis of obesity with Shannon Bond today and Shannon Bond agreed to give [place] permission to discuss obesity behavioral modification therapy today.  ASSESS: Shannon Bond has the diagnosis of obesity and her BMI today is 26.15. Shannon Bond is in the action stage of change.   ADVISE: Shannon Bond was educated on the multiple health risks of obesity as well as the benefit of weight loss to improve her health. She was advised of the need for long term treatment and the importance of lifestyle modifications to improve her current health and to decrease her risk of future health problems.  AGREE: Multiple dietary modification options and treatment options were discussed and Shannon Bond agreed to follow the recommendations documented in the above note.  ARRANGE: Shannon Bond was educated on the importance of frequent visits to treat obesity as outlined per CMS and USPSTF guidelines and agreed to schedule her next follow up appointment today.  Attestation Statements:   Reviewed by clinician on day of visit: allergies, medications, problem list, medical history, surgical history, family history, social history, and previous encounter notes.   Wilhemena Durie, am acting as Location manager for [employer], DO.  I have reviewed the above documentation for accuracy and completeness, and I agree with the above. Jearld Lesch, DO

## 2020-08-19 ENCOUNTER — Encounter (INDEPENDENT_AMBULATORY_CARE_PROVIDER_SITE_OTHER): Payer: Self-pay | Admitting: Bariatrics

## 2020-08-31 ENCOUNTER — Other Ambulatory Visit: Payer: Self-pay | Admitting: Family Medicine

## 2020-09-10 ENCOUNTER — Ambulatory Visit (INDEPENDENT_AMBULATORY_CARE_PROVIDER_SITE_OTHER): Payer: PPO | Admitting: Bariatrics

## 2020-09-10 ENCOUNTER — Other Ambulatory Visit: Payer: Self-pay

## 2020-09-10 ENCOUNTER — Encounter (INDEPENDENT_AMBULATORY_CARE_PROVIDER_SITE_OTHER): Payer: Self-pay | Admitting: Bariatrics

## 2020-09-10 VITALS — BP 131/76 | HR 74 | Temp 97.9°F | Ht 62.0 in | Wt 144.0 lb

## 2020-09-10 DIAGNOSIS — E038 Other specified hypothyroidism: Secondary | ICD-10-CM | POA: Diagnosis not present

## 2020-09-10 DIAGNOSIS — E7849 Other hyperlipidemia: Secondary | ICD-10-CM | POA: Diagnosis not present

## 2020-09-10 DIAGNOSIS — Z6833 Body mass index (BMI) 33.0-33.9, adult: Secondary | ICD-10-CM | POA: Diagnosis not present

## 2020-09-10 DIAGNOSIS — E669 Obesity, unspecified: Secondary | ICD-10-CM | POA: Diagnosis not present

## 2020-09-16 ENCOUNTER — Encounter (INDEPENDENT_AMBULATORY_CARE_PROVIDER_SITE_OTHER): Payer: Self-pay | Admitting: Bariatrics

## 2020-09-16 ENCOUNTER — Other Ambulatory Visit: Payer: Self-pay | Admitting: Family Medicine

## 2020-09-16 NOTE — Progress Notes (Signed)
Chief Complaint:   OBESITY Shannon Bond is here to discuss her progress with her obesity treatment plan along with follow-up of her obesity related diagnoses. Shannon Bond is on the Category 1 Plan and states she is following her eating plan approximately 98% of the time. Shannon Bond states she is doing cardio and strengthening for 60-90 minutes 5 times per week.  Today's visit was #: 24 Starting weight: 183 lbs Starting date: 06/24/2019 Today's weight: 144 lbs Today's date: 09/10/2020 Total lbs lost to date: 39 Total lbs lost since last in-office visit: 0  Interim History: Shannon Bond is up 1 lb and she has done very well overall.  Subjective:   1. Other specified hypothyroidism Shannon Bond is currently taking Synthroid.  2. Other hyperlipidemia Shannon Bond is currently taking Crestor.  Assessment/Plan:   1. Other specified hypothyroidism Shannon Bond will continue Synthroid and will follow up as directed. Orders and follow up as documented in patient record.  Counseling . Good thyroid control is important for overall health. Supratherapeutic thyroid levels are dangerous and will not improve weight loss results. . Counseling: The correct way to take levothyroxine is fasting, with water, separated by at least 30 minutes from breakfast, and separated by more than 4 hours from calcium, iron, multivitamins, acid reflux medications (PPIs).   2. Other hyperlipidemia Cardiovascular risk and specific lipid/LDL goals reviewed. We discussed several lifestyle modifications today. Shannon Bond will continue Crestor, and will continue to work on diet, exercise and weight loss efforts. Orders and follow up as documented in patient record.   Counseling Intensive lifestyle modifications are the first line treatment for this issue. . Dietary changes: Increase soluble fiber. Decrease simple carbohydrates. . Exercise changes: Moderate to vigorous-intensity aerobic activity 150 minutes per week if tolerated. . Lipid-lowering  medications: see documented in medical record.  3. Obesity, current BMI 26 Shannon Bond is currently in the action stage of change. As such, her goal is to continue with weight loss efforts. She has agreed to the Category 1 Plan.   Exercise goals: As is.  Behavioral modification strategies: increasing lean protein intake, decreasing simple carbohydrates, increasing vegetables, increasing water intake, decreasing eating out, no skipping meals, meal planning and cooking strategies, keeping healthy foods in the home and planning for success.  Shannon Bond has agreed to follow-up with our clinic in 4 weeks. She was informed of the importance of frequent follow-up visits to maximize her success with intensive lifestyle modifications for her multiple health conditions.   Objective:   Blood pressure 131/76, pulse 74, temperature 97.9 F (36.6 C), height 5' 2"  (1.575 m), weight 144 lb (65.3 kg), SpO2 99 %. Body mass index is 26.34 kg/m.  General: Cooperative, alert, well developed, in no acute distress. HEENT: Conjunctivae and lids unremarkable. Cardiovascular: Regular rhythm.  Lungs: Normal work of breathing. Neurologic: No focal deficits.   Lab Results  Component Value Date   CREATININE 1.01 (H) 06/22/2020   BUN 52 (H) 06/22/2020   NA 138 06/22/2020   K 4.7 06/22/2020   CL 102 06/22/2020   CO2 21 06/22/2020   Lab Results  Component Value Date   ALT 16 06/22/2020   AST 22 06/22/2020   ALKPHOS 46 06/22/2020   BILITOT 0.5 06/22/2020   Lab Results  Component Value Date   HGBA1C 5.1 06/24/2019   Lab Results  Component Value Date   INSULIN 5.6 06/24/2019   Lab Results  Component Value Date   TSH 1.91 04/29/2020   Lab Results  Component Value Date   CHOL  120 10/11/2019   HDL 44.70 10/11/2019   LDLCALC 53 10/11/2019   TRIG 113.0 10/11/2019   CHOLHDL 3 10/11/2019   Lab Results  Component Value Date   WBC 5.1 03/16/2020   HGB 12.8 03/16/2020   HCT 39.5 03/16/2020   MCV 99 (H)  03/16/2020   PLT 246 03/16/2020   Lab Results  Component Value Date   IRON 88 05/28/2019   FERRITIN 88.6 05/28/2019    Obesity Behavioral Intervention:   Approximately 15 minutes were spent on the discussion below.  ASK: We discussed the diagnosis of obesity with Shannon Bond today and Shannon Bond agreed to give [place] permission to discuss obesity behavioral modification therapy today.  ASSESS: Shannon Bond has the diagnosis of obesity and her BMI today is 26.33. Shannon Bond is in the action stage of change.   ADVISE: Shannon Bond was educated on the multiple health risks of obesity as well as the benefit of weight loss to improve her health. She was advised of the need for long term treatment and the importance of lifestyle modifications to improve her current health and to decrease her risk of future health problems.  AGREE: Multiple dietary modification options and treatment options were discussed and Shannon Bond agreed to follow the recommendations documented in the above note.  ARRANGE: Shannon Bond was educated on the importance of frequent visits to treat obesity as outlined per CMS and USPSTF guidelines and agreed to schedule her next follow up appointment today.  Attestation Statements:   Reviewed by clinician on day of visit: allergies, medications, problem list, medical history, surgical history, family history, social history, and previous encounter notes.   Wilhemena Durie, am acting as Location manager for [employer], DO.  I have reviewed the above documentation for accuracy and completeness, and I agree with the above. Jearld Lesch, DO

## 2020-09-21 ENCOUNTER — Other Ambulatory Visit: Payer: Self-pay

## 2020-09-21 ENCOUNTER — Ambulatory Visit (INDEPENDENT_AMBULATORY_CARE_PROVIDER_SITE_OTHER): Payer: PPO | Admitting: Medical

## 2020-09-21 ENCOUNTER — Encounter: Payer: Self-pay | Admitting: Medical

## 2020-09-21 VITALS — BP 145/70 | HR 84 | Resp 18 | Ht 62.0 in | Wt 150.0 lb

## 2020-09-21 DIAGNOSIS — R300 Dysuria: Secondary | ICD-10-CM | POA: Diagnosis not present

## 2020-09-21 DIAGNOSIS — R350 Frequency of micturition: Secondary | ICD-10-CM | POA: Diagnosis not present

## 2020-09-21 LAB — POC URINALSYSI DIPSTICK (AUTOMATED)
Bilirubin, UA: NEGATIVE
Blood, UA: POSITIVE
Glucose, UA: NEGATIVE
Ketones, UA: NEGATIVE
Nitrite, UA: NEGATIVE
Protein, UA: POSITIVE — AB
Spec Grav, UA: 1.025 (ref 1.010–1.025)
Urobilinogen, UA: 0.2 E.U./dL
pH, UA: 5 (ref 5.0–8.0)

## 2020-09-21 MED ORDER — CEPHALEXIN 500 MG PO CAPS: 500.0000 mg | ORAL_CAPSULE | Freq: Two times a day (BID) | ORAL | 0 refills | Status: DC

## 2020-09-21 NOTE — Progress Notes (Signed)
Subjective:    Patient ID: Shannon Bond, female    DOB: 05/05/1940, 80 y.o.   MRN: [id number]  HPI     Pt in today reporting urinary symptoms for 3-4 days.  Dysuria- yes Frequent urination- yes Hesitancy-no Suprapubic pressure-yes. Fever-no chills-no Nausea-no Vomiting-no CVA pain-no History of UTI-yes. Chronic. She state "50 or more in he life." Gross hematuria-no  Pt states in past cephalexin and cipro works. Allergic to pcn per chart. Bactrim never work and macrobid has not worked in the past.  Review of Systems  Constitutional: Negative for chills and fatigue.  HENT: Negative for congestion.   Respiratory: Negative for cough, chest tightness, wheezing and stridor.   Cardiovascular: Negative for chest pain and palpitations.  Gastrointestinal: Negative for abdominal pain.  Genitourinary: Positive for dysuria, frequency and urgency.  Musculoskeletal: Negative for back pain.  Neurological: Negative for dizziness and light-headedness.  Hematological: Negative for adenopathy. Does not bruise/bleed easily.  Psychiatric/Behavioral: Negative for behavioral problems, decreased concentration and hallucinations. The patient is not hyperactive.     Past Medical History:  Diagnosis Date  . Alcohol abuse   . Allergy   . Alopecia   . Arthritis   . Celiac disease   . Diverticulitis   . Edema of both lower extremities   . Essential hypertension 01/24/2018  . GERD (gastroesophageal reflux disease)   . Hypertension   . Hyperthyroidism   . Hypothyroidism 01/24/2018  . IBS (irritable bowel syndrome)   . Obesity (BMI 30-39.9) 04/01/2019  . Osteoarthritis   . Osteoporosis 04/01/2019  . Thyroid disease      Social History   Socioeconomic History  . Marital status: Married    Spouse name: Wilfored  . Number of children: 3  . Years of education: Not on file  . Highest education level: Not on file  Occupational History  . Occupation: Retired   Tobacco Use  . Smoking  status: Never Smoker  . Smokeless tobacco: Never Used  . Tobacco comment: admit to trying to smoke   Vaping Use  . Vaping Use: Never used  Substance and Sexual Activity  . Alcohol use: Not Currently    Comment: Patient is a alcoholic  . Drug use: Never  . Sexual activity: Not on file  Other Topics Concern  . Not on file  Social History Narrative  . Not on file   Social Determinants of Health   Financial Resource Strain: Not on file  Food Insecurity: Not on file  Transportation Needs: Not on file  Physical Activity: Not on file  Stress: Not on file  Social Connections: Not on file  Intimate Partner Violence: Not on file    Past Surgical History:  Procedure Laterality Date  . APPENDECTOMY  1954  . [place]  . COLONOSCOPY     2007 said 13 years ago. 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 ESOPHAGOGASTRODUODENOSCOPY     2014  . REPLACEMENT TOTAL KNEE BILATERAL  2003    Family History  Problem Relation Age of Onset  . Hypertension Mother   . Early death Mother        died at 56 in a fire  . AAA (abdominal aortic aneurysm) Mother   . Alcohol abuse Father   . Early death Father        died at 80  . Lung cancer Father   . Diabetes Son   . Cancer Maternal Grandmother  breast cancer  . Breast cancer Maternal Grandmother   . Mental illness Maternal Grandfather   . Esophageal cancer Neg Hx   . Colon cancer Neg Hx   . Rectal cancer Neg Hx   . Stomach cancer Neg Hx     Allergies  Allergen Reactions  . Chlorine   . Latex   . Nsaids     Said it upset her stomach really bad   . Penicillins     Current Outpatient Medications on File Prior to Visit  Medication Sig Dispense Refill  . acetaminophen (TYLENOL) 325 MG tablet Take 650 mg by mouth every 6 (six) hours as needed.    Marland Kitchen alendronate (FOSAMAX) 70 MG tablet TAKE 1 TABLET BY MOUTH ONCE A WEEK. TAKE WITH A FULL GLASS OF WATER ON AN EMPTY STOMACH. 12 tablet 2  .  cetirizine (ZYRTEC) 10 MG tablet Take 10 mg by mouth daily.    Marland Kitchen estradiol (ESTRACE) 0.1 MG/GM vaginal cream PLACE 1 APPLICATORFUL VAGINALLY AT BEDTIME. 42.5 g 1  . hydrochlorothiazide (HYDRODIURIL) 25 MG tablet Take 1 tablet (25 mg total) by mouth daily. 90 tablet 1  . ipratropium (ATROVENT) 0.06 % nasal spray PLACE 2 SPRAYS INTO BOTH NOSTRILS AS NEEDED FOR RHINITIS. 45 mL 3  . lactobacillus acidophilus (BACID) TABS tablet Take 2 tablets by mouth daily.    Marland Kitchen levothyroxine (SYNTHROID) 112 MCG tablet TAKE 1 TABLET BY MOUTH EVERY DAY BEFORE BREAKFAST 90 tablet 0  . lisinopril (ZESTRIL) 30 MG tablet Take 1 tablet (30 mg total) by mouth daily. 90 tablet 1  . Melatonin-Pyridoxine (MELATIN) 3-1 MG TABS Take by mouth.    . Multiple Vitamin (MULTIVITAMIN WITH MINERALS) TABS tablet Take 1 tablet by mouth daily.    Marland Kitchen PAPAYA ENZYME PO Take by mouth.    . rosuvastatin (CRESTOR) 20 MG tablet TAKE 1 TABLET BY MOUTH EVERY DAY 90 tablet 3  . S-Adenosylmethionine (SAM-E) 400 MG TABS Take by mouth 2 (two) times daily.    . Triamcinolone Acetonide (NASACORT ALLERGY 24HR NA) Place into the nose.     No current facility-administered medications on file prior to visit.    BP (!) 160/0   Pulse 84   Resp 18   Ht 5' 2"  (1.575 m)   Wt 150 lb (68 kg)   SpO2 100%   BMI 27.44 kg/m       Objective:   Physical Exam  General- No acute distress. Pleasant patient. Neck- Full range of motion, no jvd Lungs- Clear, even and unlabored. Heart- regular rate and rhythm. Neurologic- CNII- XII grossly intact. Abdomen- soft, nd, moderate suprapubic tenderness +bs, no rebound or guarding.  Back- no cva tenderness.       Assessment & Plan:  You appear to have a urinary tract infection. I am prescribing antibiotic for the probable infection. Hydrate well. I am sending out a urine culture. During the interim if your signs and symptoms worsen rather than improving please notify us. We will notify your when the culture  results are back.  Some blood in urine. Want to repeat urine to make sure blood has cleared on UA.  Some blood in urine. Want to repeat urine to make sure blood has cleared on UA.  Follow up in 10 days or as needed.

## 2020-09-21 NOTE — Patient Instructions (Addendum)
You appear to have a urinary tract infection. I am prescribing keflex antibiotic for the probable infection. Hydrate well. I am sending out a urine culture. During the interim if your signs and symptoms worsen rather than improving please notify us. We will notify your when the culture results are back.  Can use azostandard over the counter for pain  Some blood in urine. Want to repeat urine to make sure blood has cleared on UA.  Follow up in 10 days or as needed.

## 2020-09-23 ENCOUNTER — Telehealth: Payer: Self-pay | Admitting: Medical

## 2020-09-23 LAB — URINE CULTURE
MICRO NUMBER:: 11972695
SPECIMEN QUALITY:: ADEQUATE

## 2020-09-23 MED ORDER — CIPROFLOXACIN HCL 500 MG PO TABS: 500.0000 mg | ORAL_TABLET | Freq: Two times a day (BID) | ORAL | 0 refills | Status: DC

## 2020-09-23 NOTE — Telephone Encounter (Signed)
Cipro antibiotic sent to pt pharmacy. Advised stop keflex.

## 2020-10-09 ENCOUNTER — Other Ambulatory Visit: Payer: Self-pay

## 2020-10-09 ENCOUNTER — Ambulatory Visit (INDEPENDENT_AMBULATORY_CARE_PROVIDER_SITE_OTHER): Payer: PPO | Admitting: Family Medicine

## 2020-10-09 ENCOUNTER — Encounter: Payer: Self-pay | Admitting: Family Medicine

## 2020-10-09 VITALS — BP 122/82 | HR 77 | Temp 98.0°F | Ht 62.0 in | Wt 143.5 lb

## 2020-10-09 DIAGNOSIS — Z0001 Encounter for general adult medical examination with abnormal findings: Secondary | ICD-10-CM | POA: Diagnosis not present

## 2020-10-09 DIAGNOSIS — M545 Low back pain, unspecified: Secondary | ICD-10-CM

## 2020-10-09 DIAGNOSIS — E039 Hypothyroidism, unspecified: Secondary | ICD-10-CM | POA: Diagnosis not present

## 2020-10-09 DIAGNOSIS — E7849 Other hyperlipidemia: Secondary | ICD-10-CM | POA: Diagnosis not present

## 2020-10-09 DIAGNOSIS — R718 Other abnormality of red blood cells: Secondary | ICD-10-CM

## 2020-10-09 DIAGNOSIS — G8929 Other chronic pain: Secondary | ICD-10-CM | POA: Diagnosis not present

## 2020-10-09 DIAGNOSIS — Z0000 Encounter for general adult medical examination without abnormal findings: Secondary | ICD-10-CM

## 2020-10-09 LAB — CBC
HCT: 39.8 % (ref 36.0–46.0)
Hemoglobin: 13.6 g/dL (ref 12.0–15.0)
MCHC: 34.2 g/dL (ref 30.0–36.0)
MCV: 96.3 fl (ref 78.0–100.0)
Platelets: 281 10*3/uL (ref 150.0–400.0)
RBC: 4.13 Mil/uL (ref 3.87–5.11)
RDW: 13.4 % (ref 11.5–15.5)
WBC: 7.1 10*3/uL (ref 4.0–10.5)

## 2020-10-09 LAB — LIPID PANEL
Cholesterol: 131 mg/dL (ref 0–200)
HDL: 65.7 mg/dL (ref >39.00)
LDL Cholesterol: 42 mg/dL (ref 0–99)
NonHDL: 65.74
Total CHOL/HDL Ratio: 2
Triglycerides: 117 mg/dL (ref 0.0–149.0)
VLDL: 23.4 mg/dL (ref 0.0–40.0)

## 2020-10-09 LAB — COMPREHENSIVE METABOLIC PANEL
ALT: 20 U/L (ref 0–35)
AST: 23 U/L (ref 0–37)
Albumin: 4.5 g/dL (ref 3.5–5.2)
Alkaline Phosphatase: 46 U/L (ref 39–117)
BUN: 51 mg/dL — ABNORMAL HIGH (ref 6–23)
CO2: 26 mEq/L (ref 19–32)
Calcium: 10.3 mg/dL (ref 8.4–10.5)
Chloride: 100 mEq/L (ref 96–112)
Creatinine, Ser: 1.15 mg/dL (ref 0.40–1.20)
GFR: 45.43 mL/min — ABNORMAL LOW (ref >60.00)
Glucose, Bld: 88 mg/dL (ref 70–99)
Potassium: 4.9 mEq/L (ref 3.5–5.1)
Sodium: 135 mEq/L (ref 135–145)
Total Bilirubin: 0.9 mg/dL (ref 0.2–1.2)
Total Protein: 7.1 g/dL (ref 6.0–8.3)

## 2020-10-09 LAB — T4, FREE: Free T4: 1.45 ng/dL (ref 0.60–1.60)

## 2020-10-09 LAB — TSH: TSH: 1.75 u[IU]/mL (ref 0.35–4.50)

## 2020-10-09 MED ORDER — IPRATROPIUM BROMIDE 0.06 % NA SOLN: 2.0000 | Freq: Three times a day (TID) | NASAL | 3 refills | Status: DC

## 2020-10-09 NOTE — Progress Notes (Signed)
Chief Complaint  Patient presents with   Annual Exam     Well Woman Shannon Bond is here for a complete physical.   Her last physical was >1 year ago.  Current diet: in general, a "healthy" diet. Current exercise: HIIT, lifting weights, cardio. Weight is stable and she denies daytime fatigue. Seatbelt? Yes  Health Maintenance Shingrix- Yes DEXA- Yes Mammogram- Yes Tetanus- Yes Pneumonia- Yes Hep C screen- Yes  Low back pain L sided ck pain that is recurrent. This episode has been going on for 3 mo. No inj or change in activity. She has hx of 2 b/l knee replacements. HEP given by me is difficult to complete as she cannot get on the floor. No neuro s/s's. She is able to exercise but it hurts to walk. It radiates down her L thigh.   Past Medical History:  Diagnosis Date   Alcohol abuse    Allergy    Alopecia    Arthritis    Celiac disease    Diverticulitis    Edema of both lower extremities    Essential hypertension 01/24/2018   GERD (gastroesophageal reflux disease)    Hypertension    Hyperthyroidism    Hypothyroidism 01/24/2018   IBS (irritable bowel syndrome)    Obesity (BMI 30-39.9) 04/01/2019   Osteoarthritis    Osteoporosis 04/01/2019   Thyroid disease      Past Surgical History:  Procedure Laterality Date   [place]     2007 said 13 years ago. Said that she does the hemoocult cards test at home    ESOPHAGOGASTRODUODENOSCOPY     2014   [place] BILATERAL  2003    Medications  Current Outpatient Medications on File Prior to Visit  Medication Sig Dispense Refill   acetaminophen (TYLENOL) 325 MG tablet Take 650 mg by mouth every 6 (six) hours as needed.     alendronate (FOSAMAX) 70 MG tablet TAKE 1 TABLET BY MOUTH ONCE A WEEK. TAKE WITH A FULL GLASS OF WATER ON AN EMPTY STOMACH. 12 tablet 2   cephALEXin (KEFLEX) 500 MG capsule Take 1 capsule (500 mg total) by mouth 2 (two) times  daily. 20 capsule 0   cetirizine (ZYRTEC) 10 MG tablet Take 10 mg by mouth daily.     estradiol (ESTRACE) 0.1 MG/GM vaginal cream PLACE 1 APPLICATORFUL VAGINALLY AT BEDTIME. 42.5 g 1   hydrochlorothiazide (HYDRODIURIL) 25 MG tablet Take 1 tablet (25 mg total) by mouth daily. 90 tablet 1   lactobacillus acidophilus (BACID) TABS tablet Take 2 tablets by mouth daily.     levothyroxine (SYNTHROID) 112 MCG tablet TAKE 1 TABLET BY MOUTH EVERY DAY BEFORE BREAKFAST 90 tablet 0   lisinopril (ZESTRIL) 30 MG tablet Take 1 tablet (30 mg total) by mouth daily. 90 tablet 1   Melatonin-Pyridoxine (MELATIN) 3-1 MG TABS Take by mouth.     Multiple Vitamin (MULTIVITAMIN WITH MINERALS) TABS tablet Take 1 tablet by mouth daily.     PAPAYA ENZYME PO Take by mouth.     rosuvastatin (CRESTOR) 20 MG tablet TAKE 1 TABLET BY MOUTH EVERY DAY 90 tablet 3   S-Adenosylmethionine (SAM-E) 400 MG TABS Take by mouth 2 (two) times daily.     Triamcinolone Acetonide (NASACORT ALLERGY 24HR NA) Place into the nose.      Allergies Allergies  Allergen Reactions   Chlorine    Latex    Nsaids  Said it upset her stomach really bad    Penicillins     Review of Systems: Constitutional:  no fevers Eye:  no recent significant change in vision Ears:  No changes in hearing Nose/Mouth/Throat:  no complaints of nasal congestion, no sore throat Cardiovascular: no chest pain Respiratory:  No shortness of breath Gastrointestinal:  No change in bowel habits GU:  Female: negative for dysuria Integumentary:  no abnormal skin lesions reported Neurologic:  no headaches Endocrine:  denies unexplained weight changes  Exam BP 122/82   Pulse 77   Temp 98 F (36.7 C) (Oral)   Ht 5' 2"  (1.575 m)   Wt 143 lb 8 oz (65.1 kg)   SpO2 99%   BMI 26.25 kg/m  General:  well developed, well nourished, in no apparent distress Skin:  no significant moles, warts, or growths Head:  no masses, lesions, or tenderness Eyes:  pupils equal and  round, sclera anicteric without injection Ears:  canals without lesions, TMs shiny without retraction, no obvious effusion, no erythema Nose:  nares patent, septum midline, mucosa normal, and no drainage or sinus tenderness Throat/Pharynx:  lips and gingiva without lesion; tongue and uvula midline; non-inflamed pharynx; no exudates or postnasal drainage Neck: neck supple without adenopathy, thyromegaly, or masses Lungs:  clear to auscultation, breath sounds equal bilaterally, no respiratory distress Cardio:  regular rate and rhythm, no bruits or LE edema Abdomen:  abdomen soft, nontender; bowel sounds normal; no masses or organomegaly Genital: Deferred Neuro:  gait normal; deep tendon reflexes normal and symmetric MSK: No ttp over the lower back or SI jt b/l Psych: well oriented with normal range of affect and appropriate judgment/insight  Assessment and Plan  Well adult exam  Chronic left-sided low back pain without sciatica - Plan: Ambulatory referral to Physical Therapy  Hypothyroidism, unspecified type - Plan: TSH, T4, free  Other hyperlipidemia - Plan: Lipid panel, Comprehensive metabolic panel  Elevated MCV - Plan: CBC   Well 80 y.o. female. Counseled on diet and exercise. Other orders as above. Back: Tylenol, ice, heat, refer to PT.  Follow up in 6 mo or prn. The patient voiced understanding and agreement to the plan.  [place], DO 10/09/20 11:19 AM

## 2020-10-09 NOTE — Patient Instructions (Signed)
Give [place] 2-3 business days to get the results of your labs back.   Keep the diet clean and stay active.  Let me know if the stomach doesn't get better in the next 1-1.5 weeks.   If you do not hear anything about your referral in the next 1-2 weeks, call our office and ask for an update.  Let us know if you need anything.

## 2020-10-13 ENCOUNTER — Encounter (INDEPENDENT_AMBULATORY_CARE_PROVIDER_SITE_OTHER): Payer: Self-pay | Admitting: Bariatrics

## 2020-10-13 ENCOUNTER — Ambulatory Visit (INDEPENDENT_AMBULATORY_CARE_PROVIDER_SITE_OTHER): Payer: PPO | Admitting: Bariatrics

## 2020-10-13 ENCOUNTER — Other Ambulatory Visit: Payer: Self-pay

## 2020-10-13 VITALS — BP 137/79 | HR 68 | Temp 97.5°F | Ht 62.0 in | Wt 144.0 lb

## 2020-10-13 DIAGNOSIS — E038 Other specified hypothyroidism: Secondary | ICD-10-CM

## 2020-10-13 DIAGNOSIS — E7849 Other hyperlipidemia: Secondary | ICD-10-CM | POA: Diagnosis not present

## 2020-10-13 DIAGNOSIS — Z6833 Body mass index (BMI) 33.0-33.9, adult: Secondary | ICD-10-CM | POA: Diagnosis not present

## 2020-10-13 DIAGNOSIS — E6609 Other obesity due to excess calories: Secondary | ICD-10-CM

## 2020-10-13 DIAGNOSIS — M545 Low back pain, unspecified: Secondary | ICD-10-CM | POA: Diagnosis not present

## 2020-10-13 DIAGNOSIS — M25651 Stiffness of right hip, not elsewhere classified: Secondary | ICD-10-CM | POA: Diagnosis not present

## 2020-10-13 DIAGNOSIS — E669 Obesity, unspecified: Secondary | ICD-10-CM | POA: Diagnosis not present

## 2020-10-13 DIAGNOSIS — M25552 Pain in left hip: Secondary | ICD-10-CM | POA: Diagnosis not present

## 2020-10-13 DIAGNOSIS — M25652 Stiffness of left hip, not elsewhere classified: Secondary | ICD-10-CM | POA: Diagnosis not present

## 2020-10-15 DIAGNOSIS — M25651 Stiffness of right hip, not elsewhere classified: Secondary | ICD-10-CM | POA: Diagnosis not present

## 2020-10-15 DIAGNOSIS — M25652 Stiffness of left hip, not elsewhere classified: Secondary | ICD-10-CM | POA: Diagnosis not present

## 2020-10-15 DIAGNOSIS — M25552 Pain in left hip: Secondary | ICD-10-CM | POA: Diagnosis not present

## 2020-10-15 DIAGNOSIS — M545 Low back pain, unspecified: Secondary | ICD-10-CM | POA: Diagnosis not present

## 2020-10-16 ENCOUNTER — Other Ambulatory Visit: Payer: Self-pay | Admitting: Family Medicine

## 2020-10-16 NOTE — Progress Notes (Signed)
Chief Complaint:   OBESITY Shannon Bond is here to discuss her progress with her obesity treatment plan along with follow-up of her obesity related diagnoses. Shannon Bond is on the Category 1 Plan and states she is following her eating plan approximately 100% of the time. Shannon Bond states she is doing cardio and strength 60 minutes 6 times per week.  Today's visit was #: 25 Starting weight: 183 lbs Starting date: 06/24/2019 Today's weight: 144 lbs Today's date: 10/16/2020 Total lbs lost to date: 39 Total lbs lost since last in-office visit: 0  Interim History: Shannon Bond's weight remains the same.. She had a UTI and tool Cipro.  Her appetite has increased, (more in the afternoon).  Subjective:   1. Other specified hypothyroidism She reports not symptoms taking Synthroid/.   2. Other hyperlipidemia Shannon Bond has hyperlipidemia and has been trying to improve her cholesterol levels with intensive lifestyle modification including a low saturated fat diet, exercise and weight loss. She denies any chest pain, claudication or myalgias. She is currently taking Crestor.  Lab Results  Component Value Date   ALT 20 10/09/2020   AST 23 10/09/2020   ALKPHOS 46 10/09/2020   BILITOT 0.9 10/09/2020   Lab Results  Component Value Date   CHOL 131 10/09/2020   HDL 65.70 10/09/2020   LDLCALC 42 10/09/2020   TRIG 117.0 10/09/2020   CHOLHDL 2 10/09/2020    Assessment/Plan:   1. Other specified hypothyroidism Patient with long-standing hypothyroidism, on levothyroxine therapy. She appears euthyroid. Orders and follow up as documented in patient record. She will continue Synthroid.  Counseling Good thyroid control is important for overall health. Supratherapeutic thyroid levels are dangerous and will not improve weight loss results. The correct way to take levothyroxine is fasting, with water, separated by at least 30 minutes from breakfast, and separated by more than 4 hours from calcium, iron, multivitamins,  acid reflux medications (PPIs).   2. Other hyperlipidemia Cardiovascular risk and specific lipid/LDL goals reviewed.  We discussed several lifestyle modifications today and Shannon Bond will continue to work on diet, exercise and weight loss efforts. Orders and follow up as documented in patient record. She will continue Crestor  Counseling Intensive lifestyle modifications are the first line treatment for this issue. Dietary changes: Increase soluble fiber. Decrease simple carbohydrates. Exercise changes: Moderate to vigorous-intensity aerobic activity 150 minutes per week if tolerated. Lipid-lowering medications: see documented in medical record.   3. Obesity, current BMI 26.4 Shannon Bond is currently in the action stage of change. As such, her goal is to continue with weight loss efforts. She has agreed to the Category 1 Plan.   Continue to adhere to the plan. Increase high protein.  Exercise goals: As is.  Behavioral modification strategies: increasing lean protein intake and decreasing simple carbohydrates.  Shannon Bond has agreed to follow-up with our clinic in 4 weeks. She was informed of the importance of frequent follow-up visits to maximize her success with intensive lifestyle modifications for her multiple health conditions.   Objective:   Blood pressure 137/79, pulse 68, temperature (!) 97.5 F (36.4 C), height 5' 2"  (1.575 m), weight 144 lb (65.3 kg), SpO2 99 %. Body mass index is 26.34 kg/m.  General: Cooperative, alert, well developed, in no acute distress. HEENT: Conjunctivae and lids unremarkable. Cardiovascular: Regular rhythm.  Lungs: Normal work of breathing. Neurologic: No focal deficits.   Lab Results  Component Value Date   CREATININE 1.15 10/09/2020   BUN 51 (H) 10/09/2020   NA 135 10/09/2020  K 4.9 10/09/2020   CL 100 10/09/2020   CO2 26 10/09/2020   Lab Results  Component Value Date   ALT 20 10/09/2020   AST 23 10/09/2020   ALKPHOS 46 10/09/2020   BILITOT  0.9 10/09/2020   Lab Results  Component Value Date   HGBA1C 5.1 06/24/2019   Lab Results  Component Value Date   INSULIN 5.6 06/24/2019   Lab Results  Component Value Date   TSH 1.75 10/09/2020   Lab Results  Component Value Date   CHOL 131 10/09/2020   HDL 65.70 10/09/2020   LDLCALC 42 10/09/2020   TRIG 117.0 10/09/2020   CHOLHDL 2 10/09/2020   Lab Results  Component Value Date   VD25OH 75.6 06/22/2020   VD25OH 89.6 03/16/2020   VD25OH 57 10/26/2018   Lab Results  Component Value Date   WBC 7.1 10/09/2020   HGB 13.6 10/09/2020   HCT 39.8 10/09/2020   MCV 96.3 10/09/2020   PLT 281.0 10/09/2020   Lab Results  Component Value Date   IRON 88 05/28/2019   FERRITIN 88.6 05/28/2019    Obesity Behavioral Intervention:   Approximately 15 minutes were spent on the discussion below.  ASK: We discussed the diagnosis of obesity with Shannon Bond today and Shannon Bond agreed to give [place] permission to discuss obesity behavioral modification therapy today.  ASSESS: Shannon Bond has the diagnosis of obesity and her BMI today is 26.4. Shannon Bond is in the action stage of change.   ADVISE: Shannon Bond was educated on the multiple health risks of obesity as well as the benefit of weight loss to improve her health. She was advised of the need for long term treatment and the importance of lifestyle modifications to improve her current health and to decrease her risk of future health problems.  AGREE: Multiple dietary modification options and treatment options were discussed and Shannon Bond agreed to follow the recommendations documented in the above note.  ARRANGE: Shannon Bond was educated on the importance of frequent visits to treat obesity as outlined per CMS and USPSTF guidelines and agreed to schedule her next follow up appointment today.  Attestation Statements:   Reviewed by clinician on day of visit: allergies, medications, problem list, medical history, surgical history, family history, social history,  and previous encounter notes.  Shannon Bond, CMA, am acting as Location manager for [employer], DO.  I have reviewed the above documentation for accuracy and completeness, and I agree with the above. Jearld Lesch, DO

## 2020-10-20 ENCOUNTER — Encounter (INDEPENDENT_AMBULATORY_CARE_PROVIDER_SITE_OTHER): Payer: Self-pay | Admitting: Bariatrics

## 2020-10-20 DIAGNOSIS — M25652 Stiffness of left hip, not elsewhere classified: Secondary | ICD-10-CM | POA: Diagnosis not present

## 2020-10-20 DIAGNOSIS — M25651 Stiffness of right hip, not elsewhere classified: Secondary | ICD-10-CM | POA: Diagnosis not present

## 2020-10-20 DIAGNOSIS — M25552 Pain in left hip: Secondary | ICD-10-CM | POA: Diagnosis not present

## 2020-10-20 DIAGNOSIS — M545 Low back pain, unspecified: Secondary | ICD-10-CM | POA: Diagnosis not present

## 2020-10-23 ENCOUNTER — Other Ambulatory Visit: Payer: Self-pay | Admitting: Family Medicine

## 2020-10-23 DIAGNOSIS — M25552 Pain in left hip: Secondary | ICD-10-CM | POA: Diagnosis not present

## 2020-10-23 DIAGNOSIS — M25651 Stiffness of right hip, not elsewhere classified: Secondary | ICD-10-CM | POA: Diagnosis not present

## 2020-10-23 DIAGNOSIS — M25652 Stiffness of left hip, not elsewhere classified: Secondary | ICD-10-CM | POA: Diagnosis not present

## 2020-10-23 DIAGNOSIS — M545 Low back pain, unspecified: Secondary | ICD-10-CM | POA: Diagnosis not present

## 2020-10-26 ENCOUNTER — Other Ambulatory Visit: Payer: Self-pay | Admitting: Family Medicine

## 2020-10-26 DIAGNOSIS — M545 Low back pain, unspecified: Secondary | ICD-10-CM | POA: Diagnosis not present

## 2020-10-26 DIAGNOSIS — M810 Age-related osteoporosis without current pathological fracture: Secondary | ICD-10-CM

## 2020-10-26 DIAGNOSIS — M25552 Pain in left hip: Secondary | ICD-10-CM | POA: Diagnosis not present

## 2020-10-26 DIAGNOSIS — M25651 Stiffness of right hip, not elsewhere classified: Secondary | ICD-10-CM | POA: Diagnosis not present

## 2020-10-26 DIAGNOSIS — M25652 Stiffness of left hip, not elsewhere classified: Secondary | ICD-10-CM | POA: Diagnosis not present

## 2020-10-26 NOTE — Progress Notes (Signed)
Dexa scan

## 2020-10-29 DIAGNOSIS — M25651 Stiffness of right hip, not elsewhere classified: Secondary | ICD-10-CM | POA: Diagnosis not present

## 2020-10-29 DIAGNOSIS — M545 Low back pain, unspecified: Secondary | ICD-10-CM | POA: Diagnosis not present

## 2020-10-29 DIAGNOSIS — M25552 Pain in left hip: Secondary | ICD-10-CM | POA: Diagnosis not present

## 2020-10-29 DIAGNOSIS — M25652 Stiffness of left hip, not elsewhere classified: Secondary | ICD-10-CM | POA: Diagnosis not present

## 2020-11-02 DIAGNOSIS — M25651 Stiffness of right hip, not elsewhere classified: Secondary | ICD-10-CM | POA: Diagnosis not present

## 2020-11-02 DIAGNOSIS — M25552 Pain in left hip: Secondary | ICD-10-CM | POA: Diagnosis not present

## 2020-11-02 DIAGNOSIS — M25652 Stiffness of left hip, not elsewhere classified: Secondary | ICD-10-CM | POA: Diagnosis not present

## 2020-11-02 DIAGNOSIS — M545 Low back pain, unspecified: Secondary | ICD-10-CM | POA: Diagnosis not present

## 2020-11-03 ENCOUNTER — Other Ambulatory Visit: Payer: Self-pay

## 2020-11-03 ENCOUNTER — Ambulatory Visit (HOSPITAL_BASED_OUTPATIENT_CLINIC_OR_DEPARTMENT_OTHER)
Admission: RE | Admit: 2020-11-03 | Discharge: 2020-11-03 | Disposition: A | Discharge status: Home / Self Care | Payer: PPO | Source: Ambulatory Visit | Attending: Family Medicine | Admitting: Family Medicine

## 2020-11-03 DIAGNOSIS — M810 Age-related osteoporosis without current pathological fracture: Secondary | ICD-10-CM | POA: Insufficient documentation

## 2020-11-03 DIAGNOSIS — Z780 Asymptomatic menopausal state: Secondary | ICD-10-CM | POA: Diagnosis not present

## 2020-11-05 DIAGNOSIS — M545 Low back pain, unspecified: Secondary | ICD-10-CM | POA: Diagnosis not present

## 2020-11-05 DIAGNOSIS — M25651 Stiffness of right hip, not elsewhere classified: Secondary | ICD-10-CM | POA: Diagnosis not present

## 2020-11-05 DIAGNOSIS — M25652 Stiffness of left hip, not elsewhere classified: Secondary | ICD-10-CM | POA: Diagnosis not present

## 2020-11-05 DIAGNOSIS — M25552 Pain in left hip: Secondary | ICD-10-CM | POA: Diagnosis not present

## 2020-11-09 DIAGNOSIS — M545 Low back pain, unspecified: Secondary | ICD-10-CM | POA: Diagnosis not present

## 2020-11-09 DIAGNOSIS — M25552 Pain in left hip: Secondary | ICD-10-CM | POA: Diagnosis not present

## 2020-11-09 DIAGNOSIS — M25651 Stiffness of right hip, not elsewhere classified: Secondary | ICD-10-CM | POA: Diagnosis not present

## 2020-11-09 DIAGNOSIS — M25652 Stiffness of left hip, not elsewhere classified: Secondary | ICD-10-CM | POA: Diagnosis not present

## 2020-11-10 ENCOUNTER — Other Ambulatory Visit: Payer: Self-pay

## 2020-11-10 ENCOUNTER — Ambulatory Visit (INDEPENDENT_AMBULATORY_CARE_PROVIDER_SITE_OTHER): Payer: PPO | Admitting: Bariatrics

## 2020-11-10 ENCOUNTER — Encounter (INDEPENDENT_AMBULATORY_CARE_PROVIDER_SITE_OTHER): Payer: Self-pay | Admitting: Bariatrics

## 2020-11-10 VITALS — BP 134/84 | HR 75 | Temp 97.6°F | Ht 62.0 in | Wt 145.0 lb

## 2020-11-10 DIAGNOSIS — M1990 Unspecified osteoarthritis, unspecified site: Secondary | ICD-10-CM

## 2020-11-10 DIAGNOSIS — E7849 Other hyperlipidemia: Secondary | ICD-10-CM | POA: Diagnosis not present

## 2020-11-10 DIAGNOSIS — Z6833 Body mass index (BMI) 33.0-33.9, adult: Secondary | ICD-10-CM | POA: Diagnosis not present

## 2020-11-10 DIAGNOSIS — E669 Obesity, unspecified: Secondary | ICD-10-CM | POA: Diagnosis not present

## 2020-11-12 DIAGNOSIS — M545 Low back pain, unspecified: Secondary | ICD-10-CM | POA: Diagnosis not present

## 2020-11-12 DIAGNOSIS — M25652 Stiffness of left hip, not elsewhere classified: Secondary | ICD-10-CM | POA: Diagnosis not present

## 2020-11-12 DIAGNOSIS — M25651 Stiffness of right hip, not elsewhere classified: Secondary | ICD-10-CM | POA: Diagnosis not present

## 2020-11-12 DIAGNOSIS — M25552 Pain in left hip: Secondary | ICD-10-CM | POA: Diagnosis not present

## 2020-11-14 NOTE — Progress Notes (Signed)
Chief Complaint:   OBESITY Shannon Bond is here to discuss her progress with her obesity treatment plan along with follow-up of her obesity related diagnoses. Shannon Bond is on the Category 1 Plan and states she is following her eating plan approximately 100% of the time. Shannon Bond states she is doing cardio, strength,yoga, and walking 60 minutes 7 times per week.  Today's visit was #: 4 Starting weight: 183 lbs Starting date: 06/24/2019 Today's weight: 145 lbs Today's date: 11/10/2020 Total lbs lost to d//ate: 38 lbs Total lbs lost since last in-office visit: 0 lbs  Interim History: Shannon Bond is up 1 lbs since her las visit. She has been doing well maintaining her weight.   Subjective:   1. Other hyperlipidemia Shannon Bond has hyperlipidemia and has been trying to improve her cholesterol levels with intensive lifestyle modification including a low saturated fat diet, exercise and weight loss. She denies any chest pain, claudication or myalgias. She is currently taking Crestor.  Lab Results  Component Value Date   ALT 20 10/09/2020   AST 23 10/09/2020   ALKPHOS 46 10/09/2020   BILITOT 0.9 10/09/2020   Lab Results  Component Value Date   CHOL 131 10/09/2020   HDL 65.70 10/09/2020   LDLCALC 42 10/09/2020   TRIG 117.0 10/09/2020   CHOLHDL 2 10/09/2020   2. Other type of osteoarthritis, unspecified site Shannon Bond reports to be improving. She is currently take Fosamax and a multivitaim.  Assessment/Plan:   1. Other hyperlipidemia Cardiovascular risk and specific lipid/LDL goals reviewed.  We discussed several lifestyle modifications today and Shannon Bond will continue to work on diet, exercise and weight loss efforts. Orders and follow up as documented in patient record. Shannon Bond will continue taking Crestor.  Counseling Intensive lifestyle modifications are the first line treatment for this issue. Dietary changes: Increase soluble fiber. Decrease simple carbohydrates. Exercise changes: Moderate to  vigorous-intensity aerobic activity 150 minutes per week if tolerated. Lipid-lowering medications: see documented in medical record.   2. Other type of osteoarthritis, unspecified site Shannon Bond will continue taking her medication.  3. Obesity, current BMI 26.6 Shannon Bond is currently in the action stage of change. As such, her goal is to continue with weight loss efforts. She has agreed to the Category 1 Plan.   Meal planning Will continue to adhere to the plan  Exercise goals: As is and she is going to PT for her hip exercises.  Behavioral modification strategies: increasing lean protein intake, decreasing simple carbohydrates, increasing vegetables, increasing water intake, decreasing eating out, no skipping meals, meal planning and cooking strategies, keeping healthy foods in the home, and planning for success.  Shannon Bond has agreed to follow-up with our clinic in 4 weeks. She was informed of the importance of frequent follow-up visits to maximize her success with intensive lifestyle modifications for her multiple health conditions.   Objective:   Blood pressure 134/84, pulse 75, temperature 97.6 F (36.4 C), height 5' 2"  (1.575 m), weight 145 lb (65.8 kg), SpO2 99 %. Body mass index is 26.52 kg/m.  General: Cooperative, alert, well developed, in no acute distress. HEENT: Conjunctivae and lids unremarkable. Cardiovascular: Regular rhythm.  Lungs: Normal work of breathing. Neurologic: No focal deficits.   Lab Results  Component Value Date   CREATININE 1.15 10/09/2020   BUN 51 (H) 10/09/2020   NA 135 10/09/2020   K 4.9 10/09/2020   CL 100 10/09/2020   CO2 26 10/09/2020   Lab Results  Component Value Date   ALT 20 10/09/2020  AST 23 10/09/2020   ALKPHOS 46 10/09/2020   BILITOT 0.9 10/09/2020   Lab Results  Component Value Date   HGBA1C 5.1 06/24/2019   Lab Results  Component Value Date   INSULIN 5.6 06/24/2019   Lab Results  Component Value Date   TSH 1.75 10/09/2020    Lab Results  Component Value Date   CHOL 131 10/09/2020   HDL 65.70 10/09/2020   LDLCALC 42 10/09/2020   TRIG 117.0 10/09/2020   CHOLHDL 2 10/09/2020   Lab Results  Component Value Date   VD25OH 75.6 06/22/2020   VD25OH 89.6 03/16/2020   VD25OH 57 10/26/2018   Lab Results  Component Value Date   WBC 7.1 10/09/2020   HGB 13.6 10/09/2020   HCT 39.8 10/09/2020   MCV 96.3 10/09/2020   PLT 281.0 10/09/2020   Lab Results  Component Value Date   IRON 88 05/28/2019   FERRITIN 88.6 05/28/2019    Obesity Behavioral Intervention:   Approximately 15 minutes were spent on the discussion below.  ASK: We discussed the diagnosis of obesity with Shannon Bond today and Shannon Bond agreed to give [place] permission to discuss obesity behavioral modification therapy today.  ASSESS: Shannon Bond has the diagnosis of obesity and her BMI today is 26. Shannon Bond is in the action stage of change.   ADVISE: Shannon Bond was educated on the multiple health risks of obesity as well as the benefit of weight loss to improve her health. She was advised of the need for long term treatment and the importance of lifestyle modifications to improve her current health and to decrease her risk of future health problems.  AGREE: Multiple dietary modification options and treatment options were discussed and Shannon Bond agreed to follow the recommendations documented in the above note.  ARRANGE: Shannon Bond was educated on the importance of frequent visits to treat obesity as outlined per CMS and USPSTF guidelines and agreed to schedule her next follow up appointment today.  Attestation Statements:   Reviewed by clinician on day of visit: allergies, medications, problem list, medical history, surgical history, family history, social history, and previous encounter notes.  ILennette Bihari, CMA, am acting as transcriptionist for Dr. Jearld Lesch, DO.  I have reviewed the above documentation for accuracy and completeness, and I agree with the  above. Jearld Lesch, DO

## 2020-11-15 ENCOUNTER — Encounter (INDEPENDENT_AMBULATORY_CARE_PROVIDER_SITE_OTHER): Payer: Self-pay | Admitting: Bariatrics

## 2020-11-26 DIAGNOSIS — Z961 Presence of intraocular lens: Secondary | ICD-10-CM | POA: Diagnosis not present

## 2020-11-26 DIAGNOSIS — H43813 Vitreous degeneration, bilateral: Secondary | ICD-10-CM | POA: Diagnosis not present

## 2020-11-26 DIAGNOSIS — H53121 Transient visual loss, right eye: Secondary | ICD-10-CM | POA: Diagnosis not present

## 2020-11-26 DIAGNOSIS — H26492 Other secondary cataract, left eye: Secondary | ICD-10-CM | POA: Diagnosis not present

## 2020-12-15 ENCOUNTER — Other Ambulatory Visit: Payer: Self-pay

## 2020-12-15 ENCOUNTER — Encounter (INDEPENDENT_AMBULATORY_CARE_PROVIDER_SITE_OTHER): Payer: Self-pay | Admitting: Bariatrics

## 2020-12-15 ENCOUNTER — Ambulatory Visit (INDEPENDENT_AMBULATORY_CARE_PROVIDER_SITE_OTHER): Payer: PPO | Admitting: Bariatrics

## 2020-12-15 VITALS — BP 135/74 | HR 84 | Temp 97.5°F | Ht 62.0 in | Wt 147.0 lb

## 2020-12-15 DIAGNOSIS — E7849 Other hyperlipidemia: Secondary | ICD-10-CM | POA: Diagnosis not present

## 2020-12-15 DIAGNOSIS — E039 Hypothyroidism, unspecified: Secondary | ICD-10-CM | POA: Diagnosis not present

## 2020-12-15 DIAGNOSIS — E669 Obesity, unspecified: Secondary | ICD-10-CM

## 2020-12-15 DIAGNOSIS — Z6833 Body mass index (BMI) 33.0-33.9, adult: Secondary | ICD-10-CM | POA: Diagnosis not present

## 2020-12-15 NOTE — Progress Notes (Signed)
Chief Complaint:   OBESITY Shannon Bond is here to discuss her progress with her obesity treatment plan along with follow-up of her obesity related diagnoses. Shannon Bond is on the Category 1 Plan and states she is following her eating plan approximately 95% of the time. Shannon Bond states she is walking/zoom class for 60 minutes 5-7 times per week.  Today's visit was #: 27 Starting weight: 183 lbs Starting date: 06/24/2019 Today's weight: 147 lbs Today's date:12/15/2020 Total lbs lost to date: 36 lbs Total lbs lost since last in-office visit: 0  Interim History: Shannon Bond is up 2 lbs (on vacation) and was eating more carbohydrates. She is up 2 lbs of water per the bioimpedance.  Subjective:   1. Hypothyroidism, unspecified type Shannon Bond is currently taking Synthroid.  2. Other hyperlipidemia Shannon Bond is currently taking Crestor.  Assessment/Plan:   1. Hypothyroidism, unspecified type Shannon Bond will continue her medications. Orders and follow up as documented in patient record.  Counseling Good thyroid control is important for overall health. Supratherapeutic thyroid levels are dangerous and will not improve weight loss results. Counseling: The correct way to take levothyroxine is fasting, with water, separated by at least 30 minutes from breakfast, and separated by more than 4 hours from calcium, iron, multivitamins, acid reflux medications (PPIs).    2. Other hyperlipidemia Cardiovascular risk and specific lipid/LDL goals reviewed.  We discussed several lifestyle modifications today and Shannon Bond will continue to work on diet, exercise and weight loss efforts. Shannon Bond will continue her medications. Orders and follow up as documented in patient record.   Counseling Intensive lifestyle modifications are the first line treatment for this issue. Dietary changes: Increase soluble fiber. Decrease simple carbohydrates. Exercise changes: Moderate to vigorous-intensity aerobic activity 150 minutes per week if  tolerated. Lipid-lowering medications: see documented in medical record.   3. Obesity, current BMI 26.9 Shannon Bond is currently in the action stage of change. As such, her goal is to continue with weight loss efforts. She has agreed to the Category 1 Plan.   Shannon Bond will continue meal planning. She will keep water and protein levels high. She will stay highly adherent to the plan.  Exercise goals:  As is.  Behavioral modification strategies: increasing lean protein intake, decreasing simple carbohydrates, increasing vegetables, increasing water intake, decreasing eating out, no skipping meals, meal planning and cooking strategies, keeping healthy foods in the home, and planning for success.  Shannon Bond has agreed to follow-up with our clinic in 4 weeks. She was informed of the importance of frequent follow-up visits to maximize her success with intensive lifestyle modifications for her multiple health conditions.   Objective:   Blood pressure 135/74, pulse 84, temperature (!) 97.5 F (36.4 C), height 5' 2"  (1.575 m), weight 147 lb (66.7 kg), SpO2 99 %. Body mass index is 26.89 kg/m.  General: Cooperative, alert, well developed, in no acute distress. HEENT: Conjunctivae and lids unremarkable. Cardiovascular: Regular rhythm.  Lungs: Normal work of breathing. Neurologic: No focal deficits.   Lab Results  Component Value Date   CREATININE 1.15 10/09/2020   BUN 51 (H) 10/09/2020   NA 135 10/09/2020   K 4.9 10/09/2020   CL 100 10/09/2020   CO2 26 10/09/2020   Lab Results  Component Value Date   ALT 20 10/09/2020   AST 23 10/09/2020   ALKPHOS 46 10/09/2020   BILITOT 0.9 10/09/2020   Lab Results  Component Value Date   HGBA1C 5.1 06/24/2019   Lab Results  Component Value Date   INSULIN  5.6 06/24/2019   Lab Results  Component Value Date   TSH 1.75 10/09/2020   Lab Results  Component Value Date   CHOL 131 10/09/2020   HDL 65.70 10/09/2020   LDLCALC 42 10/09/2020   TRIG 117.0  10/09/2020   CHOLHDL 2 10/09/2020   Lab Results  Component Value Date   VD25OH 75.6 06/22/2020   VD25OH 89.6 03/16/2020   VD25OH 57 10/26/2018   Lab Results  Component Value Date   WBC 7.1 10/09/2020   HGB 13.6 10/09/2020   HCT 39.8 10/09/2020   MCV 96.3 10/09/2020   PLT 281.0 10/09/2020   Lab Results  Component Value Date   IRON 88 05/28/2019   FERRITIN 88.6 05/28/2019    Obesity Behavioral Intervention:   Approximately 15 minutes were spent on the discussion below.  ASK: We discussed the diagnosis of obesity with Shannon Bond today and Shannon Bond agreed to give [place] permission to discuss obesity behavioral modification therapy today.  ASSESS: Shannon Bond has the diagnosis of obesity and her BMI today is 26.9. Shannon Bond is in the action stage of change.   ADVISE: Shannon Bond was educated on the multiple health risks of obesity as well as the benefit of weight loss to improve her health. She was advised of the need for long term treatment and the importance of lifestyle modifications to improve her current health and to decrease her risk of future health problems.  AGREE: Multiple dietary modification options and treatment options were discussed and Shannon Bond agreed to follow the recommendations documented in the above note.  ARRANGE: Shannon Bond was educated on the importance of frequent visits to treat obesity as outlined per CMS and USPSTF guidelines and agreed to schedule her next follow up appointment today.  Attestation Statements:   Reviewed by clinician on day of visit: allergies, medications, problem list, medical history, surgical history, family history, social history, and previous encounter notes.  I, Shannon Bond, RMA, am acting as Location manager for [employer], DO.   I have reviewed the above documentation for accuracy and completeness, and I agree with the above. Jearld Lesch, DO

## 2020-12-18 IMAGING — US US RENAL
1 series · 14 of 25 positions shown · non-contrast
Comparison: 07/11/2019

CLINICAL DATA: Acute kidney injury, hypertension

EXAM:
RENAL / URINARY TRACT ULTRASOUND COMPLETE

[Series 1: us renal · 0.18mm/px · 14 of 36 slices shown]
[im 1/36]
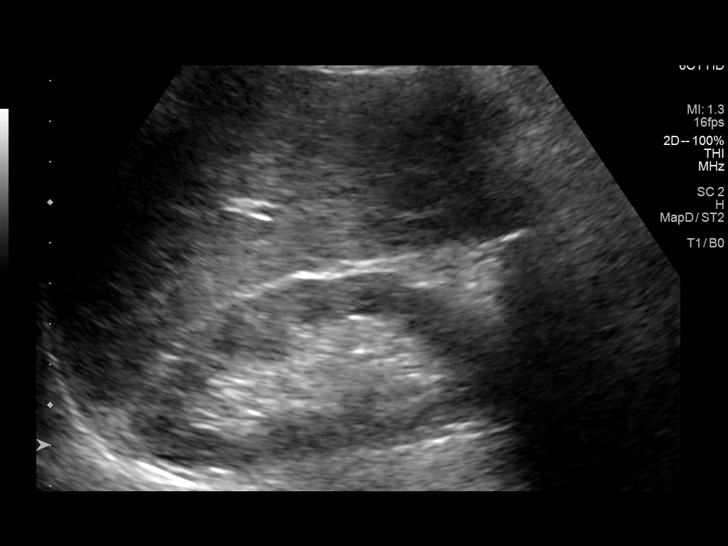
[im 3/36]
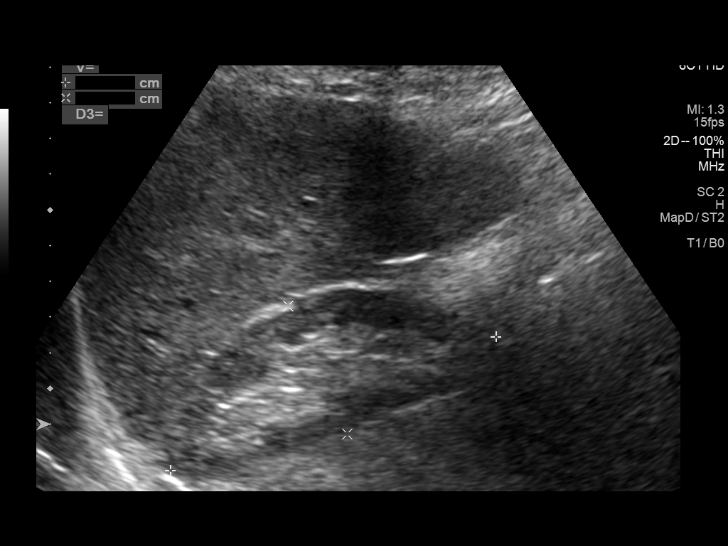
[im 6/36]
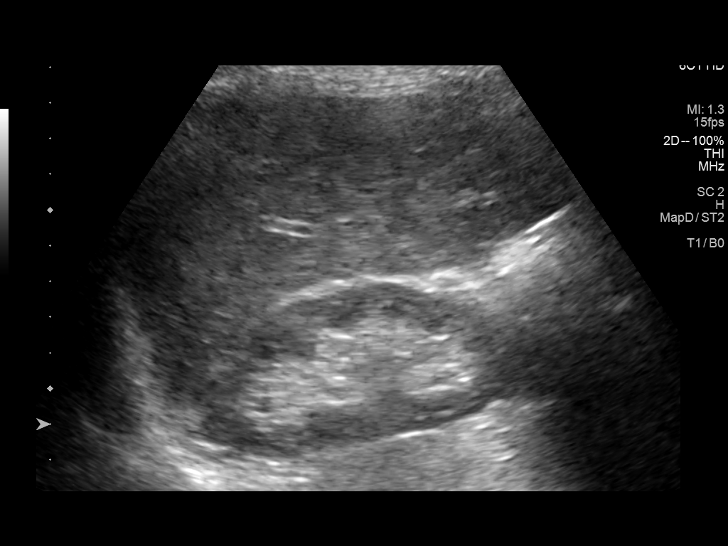
[im 9/36]
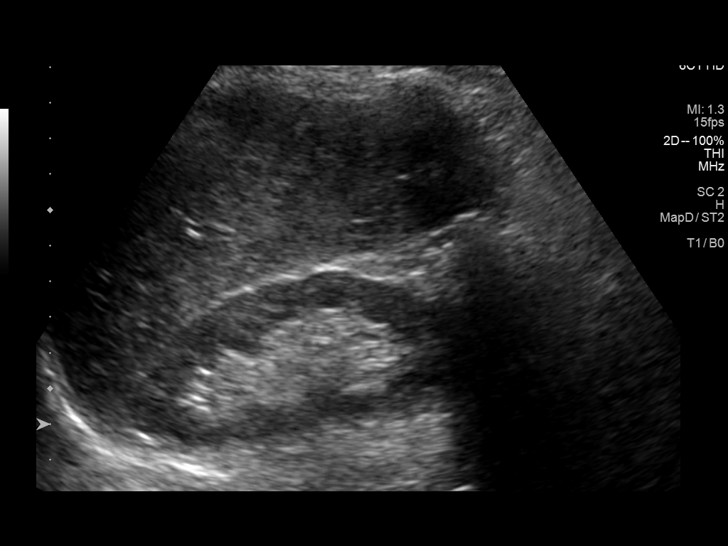
[im 12/36]
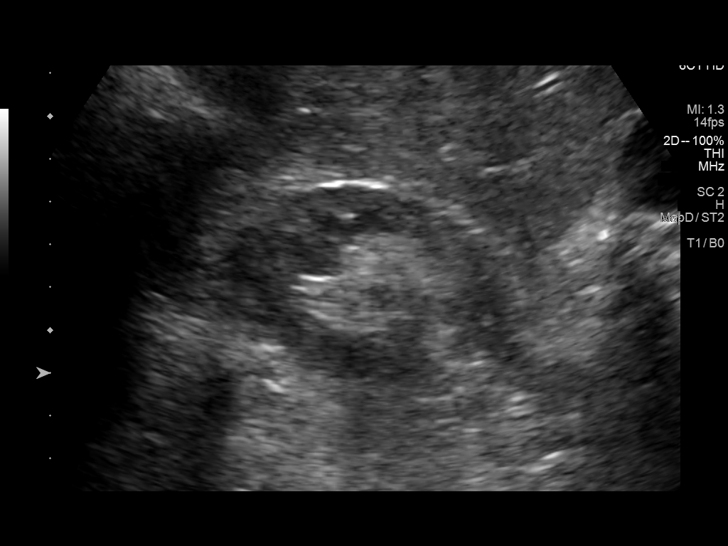
[im 14/36]
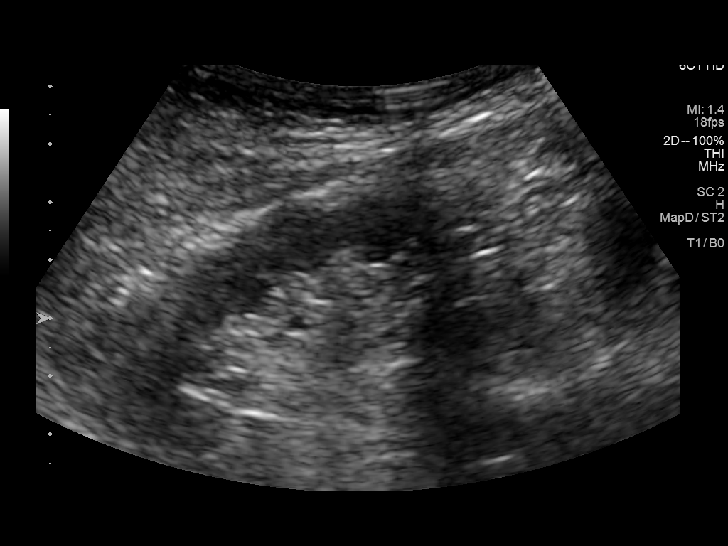
[im 17/36]
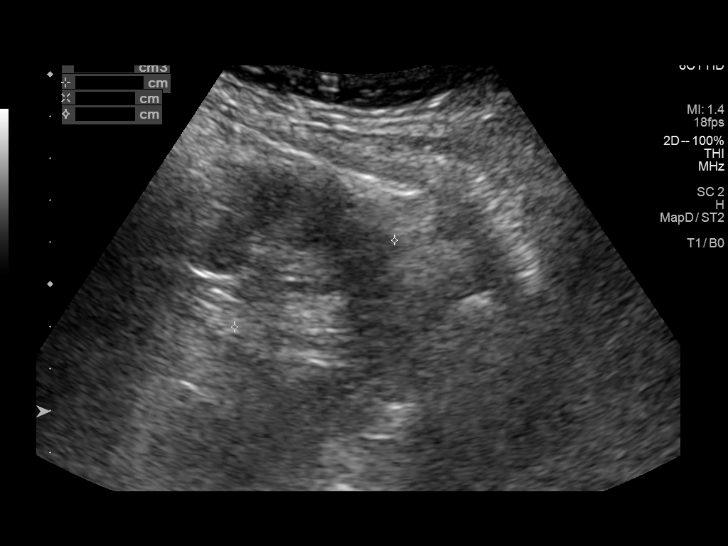
[im 19/36]
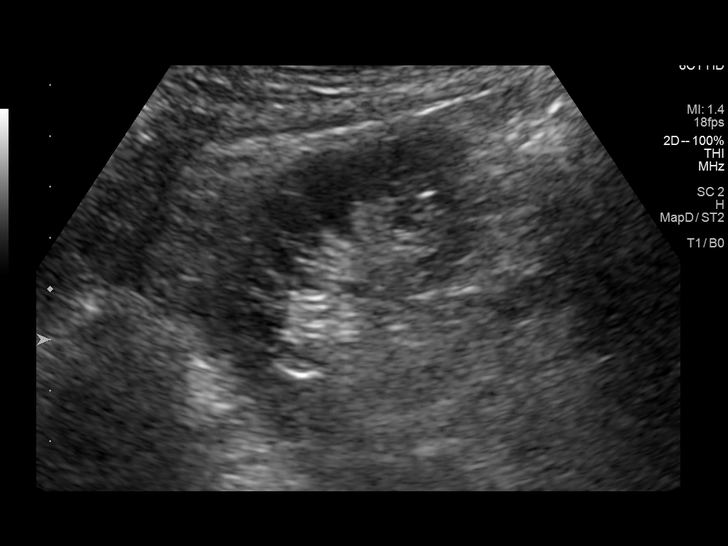
[im 22/36]
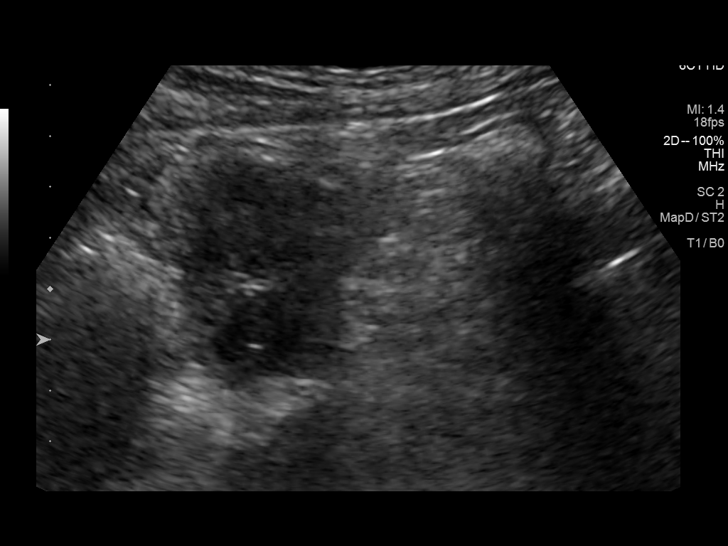
[im 24/36]
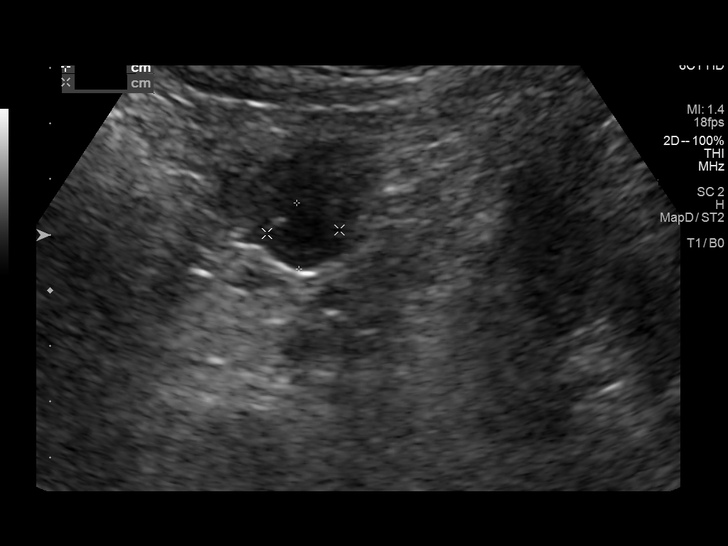
[im 27/36]
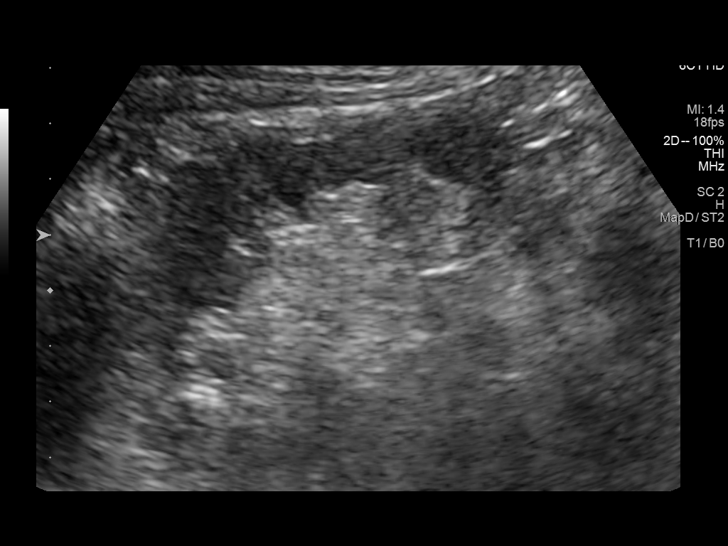
[im 30/36]
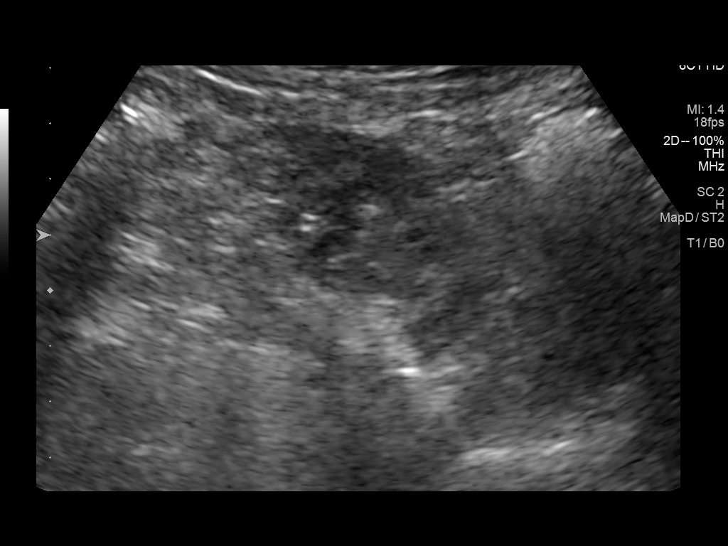
[im 33/36]
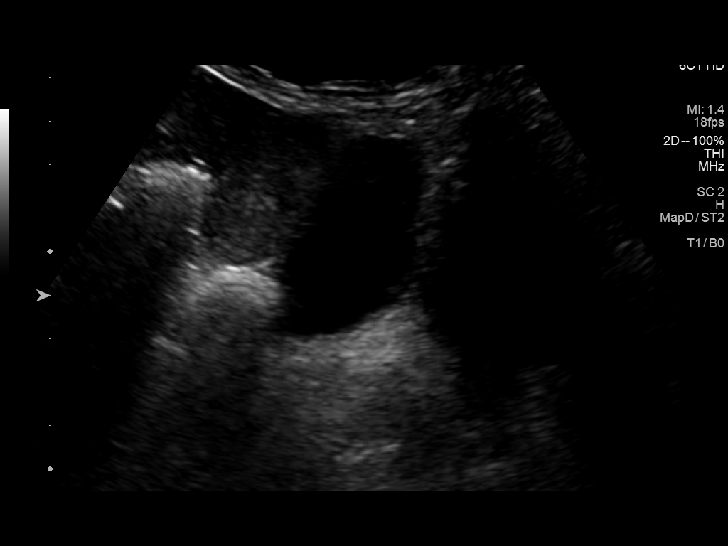
[im 36/36]
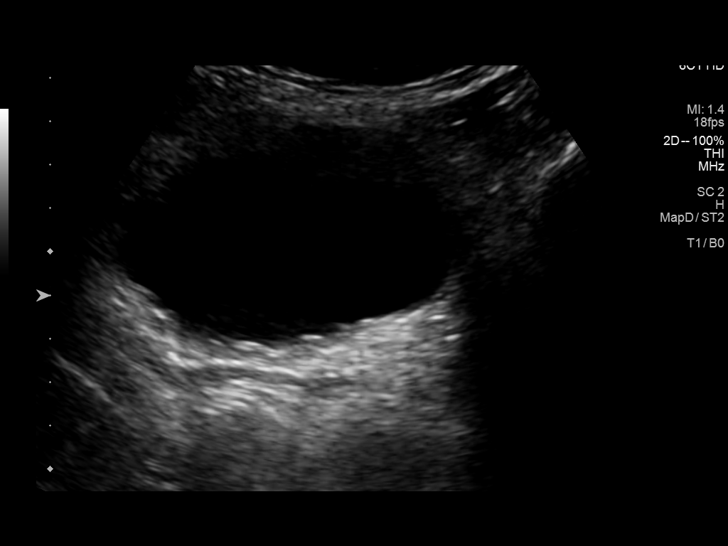

[14 of 25 positions shown; findings below may reference images not displayed]

FINDINGS: Right Kidney:

Renal measurements: 9.9 x 4.0 x 4.5 cm = volume: 92 mL. Cortical
thinning. Upper normal cortical echogenicity. No mass,
hydronephrosis, or shadowing calcification.

Left Kidney:

Renal measurements: 10.0 x 4.8 x 4.3 cm = volume: 109 mL. Cortical
thinning. Upper normal cortical echogenicity. Tiny hypoechoic nodule
at mid LEFT kidney 18 x 12 x 13 mm, containing a few scattered
internal echoes. This is better visualized than on the previous exam
appears less complex internally. Size is stable. No additional mass
or hydronephrosis.

Bladder:

Appears normal for degree of bladder distention.

Other:

N/A
IMPRESSION: BILATERAL renal cortical atrophy.

Mildly complicated hypoechoic nodule mid LEFT kidney 18 mm greatest
size unchanged in size from previous study; recommendation
additional follow-up ultrasound imaging in 6 months to demonstrate
continued stability.

## 2020-12-22 ENCOUNTER — Ambulatory Visit
Admission: RE | Admit: 2020-12-22 | Discharge: 2020-12-22 | Disposition: A | Discharge status: Home / Self Care | Payer: PPO | Source: Ambulatory Visit | Attending: Nephrology | Admitting: Nephrology

## 2020-12-22 DIAGNOSIS — N1832 Chronic kidney disease, stage 3b: Secondary | ICD-10-CM

## 2020-12-22 DIAGNOSIS — N189 Chronic kidney disease, unspecified: Secondary | ICD-10-CM | POA: Diagnosis not present

## 2020-12-22 DIAGNOSIS — N179 Acute kidney failure, unspecified: Secondary | ICD-10-CM | POA: Diagnosis not present

## 2020-12-22 DIAGNOSIS — N2889 Other specified disorders of kidney and ureter: Secondary | ICD-10-CM

## 2020-12-22 DIAGNOSIS — D631 Anemia in chronic kidney disease: Secondary | ICD-10-CM

## 2020-12-24 ENCOUNTER — Other Ambulatory Visit: Payer: Self-pay | Admitting: Nephrology

## 2020-12-24 DIAGNOSIS — N3289 Other specified disorders of bladder: Secondary | ICD-10-CM

## 2020-12-29 DIAGNOSIS — M25552 Pain in left hip: Secondary | ICD-10-CM | POA: Diagnosis not present

## 2021-01-01 ENCOUNTER — Other Ambulatory Visit: Payer: Self-pay | Admitting: Family Medicine

## 2021-01-01 DIAGNOSIS — I10 Essential (primary) hypertension: Secondary | ICD-10-CM

## 2021-01-12 ENCOUNTER — Encounter (INDEPENDENT_AMBULATORY_CARE_PROVIDER_SITE_OTHER): Payer: Self-pay | Admitting: Bariatrics

## 2021-01-12 ENCOUNTER — Other Ambulatory Visit: Payer: Self-pay

## 2021-01-12 ENCOUNTER — Ambulatory Visit (INDEPENDENT_AMBULATORY_CARE_PROVIDER_SITE_OTHER): Payer: PPO | Admitting: Bariatrics

## 2021-01-12 VITALS — BP 132/78 | HR 74 | Temp 97.5°F | Ht 62.0 in | Wt 145.0 lb

## 2021-01-12 DIAGNOSIS — E66811 Obesity, class 1: Secondary | ICD-10-CM

## 2021-01-12 DIAGNOSIS — E669 Obesity, unspecified: Secondary | ICD-10-CM | POA: Diagnosis not present

## 2021-01-12 DIAGNOSIS — Z6833 Body mass index (BMI) 33.0-33.9, adult: Secondary | ICD-10-CM | POA: Diagnosis not present

## 2021-01-12 DIAGNOSIS — E7849 Other hyperlipidemia: Secondary | ICD-10-CM

## 2021-01-12 DIAGNOSIS — I10 Essential (primary) hypertension: Secondary | ICD-10-CM | POA: Diagnosis not present

## 2021-01-12 NOTE — Progress Notes (Signed)
Chief Complaint:   OBESITY Raziah is here to discuss her progress with her obesity treatment plan along with follow-up of her obesity related diagnoses. Daanya is on the Category 1 Plan and states she is following her eating plan approximately 0% of the time. Teniyah states she is doing cardio and weights for 60 minutes 7 times per week.  Today's visit was #: 73 Starting weight: 183 lbs Starting date: 06/24/2019 Today's weight: 145 lbs Today's date: 01/12/2021 Total lbs lost to date: 38 lbs Total lbs lost since last in-office visit: 2 lbs  Interim History: Alannis is down an additional 2 lbs and doing very well controlling her weight.   Subjective:   1. Other hyperlipidemia Keilly is taking Crestor currently.   2. Essential hypertension Taraji is taking HCTZ currently.   Assessment/Plan:   1. Other hyperlipidemia Cardiovascular risk and specific lipid/LDL goals reviewed.  Leilani will continue medications and exercise. We discussed several lifestyle modifications today and Amiree will continue to work on diet, exercise and weight loss efforts. Orders and follow up as documented in patient record.   Counseling Intensive lifestyle modifications are the first line treatment for this issue. Dietary changes: Increase soluble fiber. Decrease simple carbohydrates. Exercise changes: Moderate to vigorous-intensity aerobic activity 150 minutes per week if tolerated. Lipid-lowering medications: see documented in medical record.   2. Essential hypertension Arieon will continue medications. She will have no added salt. She is working on healthy weight loss and exercise to improve blood pressure control. We will watch for signs of hypotension as she continues her lifestyle modifications.  3. Obesity, current BMI 26.7 Zeina is currently in the action stage of change. As such, her goal is to continue with weight loss efforts. She has agreed to the Category 1 Plan.   Kiauna will continue  meal planning and intentional eating. She will continue regular exercises.  Exercise goals:  Hollye will start with Nordic poles.  Behavioral modification strategies: increasing lean protein intake, decreasing simple carbohydrates, increasing vegetables, increasing water intake, decreasing eating out, no skipping meals, meal planning and cooking strategies, keeping healthy foods in the home, and planning for success.  Ravina has agreed to follow-up with our clinic in 4 weeks. She was informed of the importance of frequent follow-up visits to maximize her success with intensive lifestyle modifications for her multiple health conditions.   Objective:   Blood pressure 132/78, pulse 74, temperature (!) 97.5 F (36.4 C), height 5' 2"  (1.575 m), weight 145 lb (65.8 kg), SpO2 99 %. Body mass index is 26.52 kg/m.  General: Cooperative, alert, well developed, in no acute distress. HEENT: Conjunctivae and lids unremarkable. Cardiovascular: Regular rhythm.  Lungs: Normal work of breathing. Neurologic: No focal deficits.   Lab Results  Component Value Date   CREATININE 1.15 10/09/2020   BUN 51 (H) 10/09/2020   NA 135 10/09/2020   K 4.9 10/09/2020   CL 100 10/09/2020   CO2 26 10/09/2020   Lab Results  Component Value Date   ALT 20 10/09/2020   AST 23 10/09/2020   ALKPHOS 46 10/09/2020   BILITOT 0.9 10/09/2020   Lab Results  Component Value Date   HGBA1C 5.1 06/24/2019   Lab Results  Component Value Date   INSULIN 5.6 06/24/2019   Lab Results  Component Value Date   TSH 1.75 10/09/2020   Lab Results  Component Value Date   CHOL 131 10/09/2020   HDL 65.70 10/09/2020   LDLCALC 42 10/09/2020   TRIG  117.0 10/09/2020   CHOLHDL 2 10/09/2020   Lab Results  Component Value Date   VD25OH 75.6 06/22/2020   VD25OH 89.6 03/16/2020   VD25OH 57 10/26/2018   Lab Results  Component Value Date   WBC 7.1 10/09/2020   HGB 13.6 10/09/2020   HCT 39.8 10/09/2020   MCV 96.3 10/09/2020    PLT 281.0 10/09/2020   Lab Results  Component Value Date   IRON 88 05/28/2019   FERRITIN 88.6 05/28/2019    Obesity Behavioral Intervention:   Approximately 15 minutes were spent on the discussion below.  ASK: We discussed the diagnosis of obesity with Shelene today and Soha agreed to give [place] permission to discuss obesity behavioral modification therapy today.  ASSESS: Kashena has the diagnosis of obesity and her BMI today is 26.7. Myalee is in the action stage of change.   ADVISE: Lai was educated on the multiple health risks of obesity as well as the benefit of weight loss to improve her health. She was advised of the need for long term treatment and the importance of lifestyle modifications to improve her current health and to decrease her risk of future health problems.  AGREE: Multiple dietary modification options and treatment options were discussed and Karlisha agreed to follow the recommendations documented in the above note.  ARRANGE: Starasia was educated on the importance of frequent visits to treat obesity as outlined per CMS and USPSTF guidelines and agreed to schedule her next follow up appointment today.  Attestation Statements:   Reviewed by clinician on day of visit: allergies, medications, problem list, medical history, surgical history, family history, social history, and previous encounter notes.   I, Lizbeth Bark, RMA, am acting as Location manager for [employer], DO.   I have reviewed the above documentation for accuracy and completeness, and I agree with the above. Jearld Lesch, DO

## 2021-01-13 ENCOUNTER — Other Ambulatory Visit: Payer: Self-pay

## 2021-01-13 ENCOUNTER — Encounter (INDEPENDENT_AMBULATORY_CARE_PROVIDER_SITE_OTHER): Payer: Self-pay | Admitting: Bariatrics

## 2021-01-13 ENCOUNTER — Ambulatory Visit
Admission: EM | Admit: 2021-01-13 | Discharge: 2021-01-13 | Disposition: A | Discharge status: Home / Self Care | Payer: PPO | Attending: Emergency Medicine | Admitting: Emergency Medicine

## 2021-01-13 DIAGNOSIS — N390 Urinary tract infection, site not specified: Secondary | ICD-10-CM | POA: Diagnosis not present

## 2021-01-13 DIAGNOSIS — R300 Dysuria: Secondary | ICD-10-CM

## 2021-01-13 LAB — POCT URINALYSIS DIP (MANUAL ENTRY)
Bilirubin, UA: NEGATIVE
Blood, UA: NEGATIVE
Glucose, UA: NEGATIVE mg/dL
Ketones, POC UA: NEGATIVE mg/dL
Nitrite, UA: POSITIVE — AB
Protein Ur, POC: NEGATIVE mg/dL
Spec Grav, UA: 1.02 (ref 1.010–1.025)
Urobilinogen, UA: 0.2 E.U./dL
pH, UA: 5 (ref 5.0–8.0)

## 2021-01-13 MED ORDER — CEFDINIR 300 MG PO CAPS: 300.0000 mg | ORAL_CAPSULE | Freq: Two times a day (BID) | ORAL | 0 refills | Status: AC

## 2021-01-13 NOTE — Discharge Instructions (Signed)
Urine showed evidence of infection. We are treating you with omnicef- twice daily for 1 week. Be sure to take full course. Stay hydrated- urine should be pale yellow to clear.   Please return or follow up with your primary provider if symptoms not improving with treatment. Please return sooner if you have worsening of symptoms or develop fever, nausea, vomiting, abdominal pain, back pain, lightheadedness, dizziness.

## 2021-01-13 NOTE — ED Triage Notes (Signed)
Pt reports having bladder pain, pressure and urinary frequency for about 4 days.   Home interventions: Azo

## 2021-01-13 NOTE — ED Provider Notes (Signed)
[facility]    CSN: [id number] Arrival date & time: 01/13/21  1025      History   Chief Complaint Chief Complaint  Patient presents with   Urinary Frequency    HPI Shannon Bond is a 80 y.o. female history of GERD, hypertension, presenting today for evaluation of possible UTI.  Reports over the past 4 days has had bladder pressure, urinary frequency and dysuria.  Using Azo.  History of similar with past UTIs.  Last UTI was in June 2022, culture positive for Enterobacter, resistant to Keflex, Macrobid, does not tolerate Bactrim, prefers to avoid Cipro if possible.  Did have sensitivity to third-generation cephalosporins.  HPI  Past Medical History:  Diagnosis Date   Alcohol abuse    Allergy    Alopecia    Arthritis    Celiac disease    Diverticulitis    Edema of both lower extremities    Essential hypertension 01/24/2018   GERD (gastroesophageal reflux disease)    Hypertension    Hyperthyroidism    Hypothyroidism 01/24/2018   IBS (irritable bowel syndrome)    Obesity (BMI 30-39.9) 04/01/2019   Osteoarthritis    Osteoporosis 04/01/2019   Thyroid disease     Patient Active Problem List   Diagnosis Date Noted   Moderate aortic regurgitation 10/11/2019   Chronic laryngitis 07/22/2019   Chronic rhinitis 07/22/2019   Dysphagia 07/22/2019   Laryngopharyngeal reflux 07/22/2019   Sensorineural hearing loss 07/22/2019   Subjective tinnitus 07/22/2019   Thyroid nodule 07/22/2019   DOE (dyspnea on exertion) 07/02/2019   Cardiac murmur 07/02/2019   Osteoporosis 04/01/2019   Obesity (BMI 30-39.9) 04/01/2019   Essential hypertension 01/24/2018   Hypothyroidism 01/24/2018    Past Surgical History:  Procedure Laterality Date   [place]     2007 said 13 years ago. Said that she does the hemoocult cards test at home    ESOPHAGOGASTRODUODENOSCOPY     2014   REPLACEMENT TOTAL KNEE BILATERAL  2003     OB History     Gravida  3   Para  3   Term      Preterm      AB      Living         SAB      IAB      Ectopic      Multiple      Live Births               Home Medications    Prior to Admission medications   Medication Sig Start Date End Date Taking? Authorizing Provider  cefdinir (OMNICEF) 300 MG capsule Take 1 capsule (300 mg total) by mouth 2 (two) times daily for 7 days. 01/13/21 01/20/21 Yes Ambar Raphael C, PA-C  acetaminophen (TYLENOL) 325 MG tablet Take 650 mg by mouth every 6 (six) hours as needed.    [provider]  alendronate (FOSAMAX) 70 MG tablet TAKE 1 TABLET BY MOUTH ONCE A WEEK. TAKE WITH A FULL GLASS OF WATER ON AN EMPTY STOMACH. 10/16/20   Shelda Pal, DO  cetirizine (ZYRTEC) 10 MG tablet Take 10 mg by mouth daily.    [provider]  estradiol (ESTRACE) 0.1 MG/GM vaginal cream PLACE 1 APPLICATORFUL VAGINALLY AT BEDTIME. 12/02/19   Wendling, Crosby Oyster, DO  hydrochlorothiazide (HYDRODIURIL) 25 MG tablet Take 1 tablet (25 mg total) by mouth daily. 08/31/20  Shelda Pal, DO  ipratropium (ATROVENT) 0.06 % nasal spray Place 2 sprays into both nostrils 3 (three) times daily. 10/09/20   Shelda Pal, DO  lactobacillus acidophilus (BACID) TABS tablet Take 2 tablets by mouth daily.    [provider]  levothyroxine (SYNTHROID) 112 MCG tablet TAKE 1 TABLET BY MOUTH EVERY DAY BEFORE BREAKFAST 10/23/20   Shelda Pal, DO  lisinopril (ZESTRIL) 30 MG tablet TAKE 1 TABLET BY MOUTH EVERY DAY 01/01/21   Wendling, Crosby Oyster, DO  Melatonin-Pyridoxine (MELATIN) 3-1 MG TABS Take by mouth.    [provider]  Multiple Vitamin (MULTIVITAMIN WITH MINERALS) TABS tablet Take 1 tablet by mouth daily.    [provider]  PAPAYA ENZYME PO Take by mouth.    [provider]  rosuvastatin (CRESTOR) 20 MG tablet TAKE 1 TABLET BY MOUTH EVERY DAY 09/16/20   Wendling, Crosby Oyster, DO  S-Adenosylmethionine (SAM-E) 400 MG TABS Take by mouth 2 (two) times daily.    [provider]  Triamcinolone Acetonide (NASACORT ALLERGY 24HR NA) Place into the nose.    [provider]    Family History Family History  Problem Relation Age of Onset   Hypertension Mother    Early death Mother        died at 45 in a fire   AAA (abdominal aortic aneurysm) Mother    Alcohol abuse Father    Early death Father        died at 79   Lung cancer Father    Diabetes Son    Cancer Maternal Grandmother        breast cancer   Breast cancer Maternal Grandmother    Mental illness Maternal Grandfather    Esophageal cancer Neg Hx    Colon cancer Neg Hx    Rectal cancer Neg Hx    Stomach cancer Neg Hx     Social History Social History   Tobacco Use   Smoking status: Never   Smokeless tobacco: Never   Tobacco comments:    admit to trying to smoke   Vaping Use   Vaping Use: Never used  Substance Use Topics   Alcohol use: Not Currently    Comment: Patient is a alcoholic   Drug use: Never     Allergies   Bactrim [sulfamethoxazole-trimethoprim], Chlorine, Latex, Macrobid [nitrofurantoin], Nsaids, and Penicillins   Review of Systems Review of Systems  Constitutional:  Negative for fever.  Respiratory:  Negative for shortness of breath.   Cardiovascular:  Negative for chest pain.  Gastrointestinal:  Negative for abdominal pain, diarrhea, nausea and vomiting.  Genitourinary:  Positive for dysuria, frequency and urgency. Negative for flank pain, genital sores, hematuria, menstrual problem, vaginal bleeding, vaginal discharge and vaginal pain.  Musculoskeletal:  Negative for back pain.  Skin:  Negative for rash.  Neurological:  Negative for dizziness, light-headedness and headaches.    Physical Exam Triage Vital Signs ED Triage Vitals  Enc Vitals Group     BP 01/13/21 1145 (!) 151/80     Pulse Rate 01/13/21 1145 74     Resp 01/13/21 1145 16     Temp  01/13/21 1145 98.2 F (36.8 C)     Temp Source 01/13/21 1145 Oral     SpO2 01/13/21 1145 97 %     Weight --      Height --      Head Circumference --      Peak Flow --  Pain Score 01/13/21 1143 0     Pain Loc --      Pain Edu? --      Excl. in [place]? --    No data found.  Updated Vital Signs BP (!) 151/80 (BP Location: Right Arm)   Pulse 74   Temp 98.2 F (36.8 C) (Oral)   Resp 16   SpO2 97%   Visual Acuity Right Eye Distance:   Left Eye Distance:   Bilateral Distance:    Right Eye Near:   Left Eye Near:    Bilateral Near:     Physical Exam Vitals and nursing note reviewed.  Constitutional:      Appearance: She is well-developed.     Comments: No acute distress  HENT:     Head: Normocephalic and atraumatic.     Nose: Nose normal.  Eyes:     Conjunctiva/sclera: Conjunctivae normal.  Cardiovascular:     Rate and Rhythm: Normal rate.  Pulmonary:     Effort: Pulmonary effort is normal. No respiratory distress.  Abdominal:     General: There is no distension.  Musculoskeletal:        General: Normal range of motion.     Cervical back: Neck supple.  Skin:    General: Skin is warm and dry.  Neurological:     Mental Status: She is alert and oriented to person, place, and time.     UC Treatments / Results  Labs (all labs ordered are listed, but only abnormal results are displayed) Labs Reviewed  POCT URINALYSIS DIP (MANUAL ENTRY) - Abnormal; Notable for the following components:      Result Value   Nitrite, UA Positive (*)    Leukocytes, UA Trace (*)    All other components within normal limits  URINE CULTURE    EKG   Radiology No results found.  Procedures Procedures (including critical care time)  Medications Ordered in UC Medications - No data to display  Initial Impression / Assessment and Plan / UC Course  I have reviewed the triage vital signs and the nursing notes.  Pertinent labs & imaging results that were available during my care  of the patient were reviewed by me and considered in my medical decision making (see chart for details).     UA with trace leuks, positive nitrites, will send for culture to confirm UTI as well as check sensitivities.  Empirically started on Omnicef twice daily x1 week, will alter therapy as needed based off culture.  Discussed strict return precautions. Patient verbalized understanding and is agreeable with plan.  Final Clinical Impressions(s) / UC Diagnoses   Final diagnoses:  Dysuria  Lower urinary tract infection, acute     Discharge Instructions      Urine showed evidence of infection. We are treating you with omnicef- twice daily for 1 week. Be sure to take full course. Stay hydrated- urine should be pale yellow to clear.   Please return or follow up with your primary provider if symptoms not improving with treatment. Please return sooner if you have worsening of symptoms or develop fever, nausea, vomiting, abdominal pain, back pain, lightheadedness, dizziness.      ED Prescriptions     Medication Sig Dispense Auth. Provider   cefdinir (OMNICEF) 300 MG capsule Take 1 capsule (300 mg total) by mouth 2 (two) times daily for 7 days. 14 capsule Yocelyn Brocious, [place] C, PA-C      PDMP not reviewed this encounter.   Elias Bordner, [place] C, PA-C  01/13/21 1223  

## 2021-01-14 ENCOUNTER — Ambulatory Visit
Admission: RE | Admit: 2021-01-14 | Discharge: 2021-01-14 | Disposition: A | Discharge status: Home / Self Care | Payer: PPO | Source: Ambulatory Visit | Attending: Nephrology | Admitting: Nephrology

## 2021-01-14 DIAGNOSIS — K589 Irritable bowel syndrome without diarrhea: Secondary | ICD-10-CM | POA: Diagnosis not present

## 2021-01-14 DIAGNOSIS — N323 Diverticulum of bladder: Secondary | ICD-10-CM | POA: Diagnosis not present

## 2021-01-14 DIAGNOSIS — M47816 Spondylosis without myelopathy or radiculopathy, lumbar region: Secondary | ICD-10-CM | POA: Diagnosis not present

## 2021-01-14 DIAGNOSIS — I10 Essential (primary) hypertension: Secondary | ICD-10-CM | POA: Diagnosis not present

## 2021-01-14 DIAGNOSIS — N3289 Other specified disorders of bladder: Secondary | ICD-10-CM

## 2021-01-15 LAB — URINE CULTURE: Culture: 70000 — AB

## 2021-01-18 ENCOUNTER — Other Ambulatory Visit: Payer: Self-pay | Admitting: Family Medicine

## 2021-01-19 ENCOUNTER — Other Ambulatory Visit: Payer: Self-pay

## 2021-01-19 ENCOUNTER — Ambulatory Visit (INDEPENDENT_AMBULATORY_CARE_PROVIDER_SITE_OTHER): Payer: PPO | Admitting: Medical

## 2021-01-19 VITALS — BP 140/60 | HR 91 | Temp 98.7°F | Resp 18 | Ht 62.0 in | Wt 151.0 lb

## 2021-01-19 DIAGNOSIS — H6123 Impacted cerumen, bilateral: Secondary | ICD-10-CM

## 2021-01-19 NOTE — Patient Instructions (Addendum)
Bilateral wax cerumen impaction. Post lavage left side no wax seen. Rt side post lavage needed to use curette to remove moderate amount of wax. Post lavage both tm normal and intact.  If wax re-accumulates in future advise use debrox 3-4 days then come in for lavage.  Follow up as regularly scheduled with pcp or sooner if needed.

## 2021-01-19 NOTE — Progress Notes (Signed)
Subjective:    Patient ID: Shannon Bond, female    DOB: 09/29/1940, 80 y.o.   MRN: [id number]  HPI  Pt in for ear lavage. Pt went for hearing test with audiologist. They told her have wax and they could not do study. Pt did some otc drops like debrox and then tried to self wash out wax. She was not unsuccessful. Pt has hearing aids and wears ears plugs at night.   No ear pain. No uri signs/symptoms reported.  Review of Systems  Constitutional:  Negative for chills, fatigue and fever.  HENT:  Negative for congestion, ear discharge, ear pain and mouth sores.   Respiratory:  Negative for cough and chest tightness.   Cardiovascular:  Negative for chest pain and palpitations.  Neurological:  Negative for dizziness.    Past Medical History:  Diagnosis Date   Alcohol abuse    Allergy    Alopecia    Arthritis    Celiac disease    Diverticulitis    Edema of both lower extremities    Essential hypertension 01/24/2018   GERD (gastroesophageal reflux disease)    Hypertension    Hyperthyroidism    Hypothyroidism 01/24/2018   IBS (irritable bowel syndrome)    Obesity (BMI 30-39.9) 04/01/2019   Osteoarthritis    Osteoporosis 04/01/2019   Thyroid disease      Social History   Socioeconomic History   Marital status: Married    Spouse name: Wilfored   Number of children: 3   Years of education: Not on file   Highest education level: Not on file  Occupational History   Occupation: Retired   Tobacco Use   Smoking status: Never   Smokeless tobacco: Never   Tobacco comments:    admit to trying to smoke   Vaping Use   Vaping Use: Never used  Substance and Sexual Activity   Alcohol use: Not Currently    Comment: Patient is a alcoholic   Drug use: Never   Sexual activity: Not on file  Other Topics Concern   Not on file  Social History Narrative   Not on file   Social Determinants of Health   Financial Resource Strain: Not on file  Food Insecurity: Not on file   Transportation Needs: Not on file  Physical Activity: Not on file  Stress: Not on file  Social Connections: Not on file  Intimate Partner Violence: Not on file    Past Surgical History:  Procedure Laterality Date   [place]     2007 said 13 years ago. Said that she does the hemoocult cards test at home    ESOPHAGOGASTRODUODENOSCOPY     2014   REPLACEMENT TOTAL KNEE BILATERAL  2003    Family History  Problem Relation Age of Onset   Hypertension Mother    Early death Mother        died at 80 in a fire   AAA (abdominal aortic aneurysm) Mother    Alcohol abuse Father    Early death Father        died at 47   Lung cancer Father    Diabetes Son    Cancer Maternal Grandmother        breast cancer   Breast cancer Maternal Grandmother    Mental illness Maternal Grandfather    Esophageal cancer Neg Hx    Colon cancer Neg Hx    Rectal cancer Neg  Hx    Stomach cancer Neg Hx     Allergies  Allergen Reactions   Bactrim [Sulfamethoxazole-Trimethoprim]     Nausea, Vomiting, Abdominal Pain   Chlorine    Latex    Macrobid [Nitrofurantoin]     Diarrhea   Nsaids     Said it upset her stomach really bad    Penicillins     Current Outpatient Medications on File Prior to Visit  Medication Sig Dispense Refill   acetaminophen (TYLENOL) 325 MG tablet Take 650 mg by mouth every 6 (six) hours as needed.     alendronate (FOSAMAX) 70 MG tablet TAKE 1 TABLET BY MOUTH ONCE A WEEK. TAKE WITH A FULL GLASS OF WATER ON AN EMPTY STOMACH. 12 tablet 2   cefdinir (OMNICEF) 300 MG capsule Take 1 capsule (300 mg total) by mouth 2 (two) times daily for 7 days. 14 capsule 0   cetirizine (ZYRTEC) 10 MG tablet Take 10 mg by mouth daily.     estradiol (ESTRACE) 0.1 MG/GM vaginal cream PLACE 1 APPLICATORFUL VAGINALLY AT BEDTIME. 42.5 g 1   hydrochlorothiazide (HYDRODIURIL) 25 MG tablet Take 1 tablet (25 mg total) by mouth daily. 90 tablet 1    ipratropium (ATROVENT) 0.06 % nasal spray Place 2 sprays into both nostrils 3 (three) times daily. 135 mL 3   lactobacillus acidophilus (BACID) TABS tablet Take 2 tablets by mouth daily.     levothyroxine (SYNTHROID) 112 MCG tablet TAKE 1 TABLET BY MOUTH EVERY DAY BEFORE BREAKFAST 90 tablet 0   lisinopril (ZESTRIL) 30 MG tablet TAKE 1 TABLET BY MOUTH EVERY DAY 90 tablet 1   Melatonin-Pyridoxine (MELATIN) 3-1 MG TABS Take by mouth.     Multiple Vitamin (MULTIVITAMIN WITH MINERALS) TABS tablet Take 1 tablet by mouth daily.     PAPAYA ENZYME PO Take by mouth.     rosuvastatin (CRESTOR) 20 MG tablet TAKE 1 TABLET BY MOUTH EVERY DAY 90 tablet 3   S-Adenosylmethionine (SAM-E) 400 MG TABS Take by mouth 2 (two) times daily.     Triamcinolone Acetonide (NASACORT ALLERGY 24HR NA) Place into the nose.     No current facility-administered medications on file prior to visit.    BP 140/60   Pulse 91   Temp 98.7 F (37.1 C)   Resp 18   Ht 5' 2"  (1.575 m)   Wt 151 lb (68.5 kg)   SpO2 100%   BMI 27.62 kg/m       Objective:   Physical Exam  General- no acute distress, pleasant and alert. Heent- no frontal or maxillary pressure. Ears- mild- moderate wax left side canal. Can see tm which is norma. Rt side- moderate to severe wax. But can see tm portion and it is normal. Post lavage- appears MA did get out wax on left side. Rt side- lavage did not remove wax. I did get currette and removed wax.       Assessment & Plan:   Patient Instructions  Bilateral wax cerumen impaction. Post lavage left side no wax seen. Rt side post lavage needed to use curette to remove moderate amount of wax. Post lavage both tm normal and intact.  If wax re-accumulates in future advise use debrox 3-4 days then come in for lavage.  Follow up as regularly scheduled with pcp or sooner if needed.

## 2021-01-25 DIAGNOSIS — M25552 Pain in left hip: Secondary | ICD-10-CM | POA: Diagnosis not present

## 2021-02-09 ENCOUNTER — Ambulatory Visit (INDEPENDENT_AMBULATORY_CARE_PROVIDER_SITE_OTHER): Payer: PPO | Admitting: Bariatrics

## 2021-02-09 ENCOUNTER — Encounter (INDEPENDENT_AMBULATORY_CARE_PROVIDER_SITE_OTHER): Payer: Self-pay | Admitting: Bariatrics

## 2021-02-09 ENCOUNTER — Other Ambulatory Visit: Payer: Self-pay

## 2021-02-09 VITALS — BP 129/73 | HR 74 | Temp 97.5°F | Ht 62.0 in | Wt 146.0 lb

## 2021-02-09 DIAGNOSIS — E669 Obesity, unspecified: Secondary | ICD-10-CM

## 2021-02-09 DIAGNOSIS — M25562 Pain in left knee: Secondary | ICD-10-CM | POA: Diagnosis not present

## 2021-02-09 DIAGNOSIS — E7849 Other hyperlipidemia: Secondary | ICD-10-CM

## 2021-02-09 DIAGNOSIS — I10 Essential (primary) hypertension: Secondary | ICD-10-CM

## 2021-02-09 DIAGNOSIS — Z6833 Body mass index (BMI) 33.0-33.9, adult: Secondary | ICD-10-CM

## 2021-02-09 NOTE — Progress Notes (Signed)
Chief Complaint:   OBESITY Shannon Bond is here to discuss her progress with her obesity treatment plan along with follow-up of her obesity related diagnoses. Shannon Bond is on the Category 1 Plan and states she is following her eating plan approximately 100% of the time. Shannon Bond states she is doing cardio and weight training 60 minutes 7 times per week.  Today's visit was #: 28 Starting weight: 183 lbs Starting date: 06/24/2019 Today's weight: 146 lbs Today's date: 02/09/2021 Total lbs lost to date: 37 Total lbs lost since last in-office visit: 0  Interim History: Shannon Bond is up 1 lb from her last visit and is doing a fantastic job maintaining her weight. She had COVID-19 in the interim, but has recovered.  She will have a left hip replacement 03/31/2021.  Subjective:   1. Essential hypertension BP controlled.  2. Other hyperlipidemia Shannon Bond is taking Crestor as directed.  Assessment/Plan:   1. Essential hypertension Sabryn is working on healthy weight loss and exercise to improve blood pressure control. We will watch for signs of hypotension as she continues her lifestyle modifications. Continue current treatment plan.  2. Other hyperlipidemia Cardiovascular risk and specific lipid/LDL goals reviewed.  We discussed several lifestyle modifications today and Shannon Bond will continue to work on diet, exercise and weight loss efforts. Orders and follow up as documented in patient record. Continue current treatment plan.  Counseling Intensive lifestyle modifications are the first line treatment for this issue. Dietary changes: Increase soluble fiber. Decrease simple carbohydrates. Exercise changes: Moderate to vigorous-intensity aerobic activity 150 minutes per week if tolerated. Lipid-lowering medications: see documented in medical record.  3. Obesity, current BMI 26.7  Shannon Bond is currently in the action stage of change. As such, her goal is to continue with weight loss efforts. She has  agreed to the Category 1 Plan.   Meal planning and intentional eating.  Exercise goals:  As is  Behavioral modification strategies: increasing lean protein intake, decreasing simple carbohydrates, increasing vegetables, increasing water intake, decreasing eating out, no skipping meals, meal planning and cooking strategies, keeping healthy foods in the home, and planning for success.  Shannon Bond has agreed to follow-up with our clinic in 4 weeks. She was informed of the importance of frequent follow-up visits to maximize her success with intensive lifestyle modifications for her multiple health conditions.   Objective:   Blood pressure 129/73, pulse 74, temperature (!) 97.5 F (36.4 C), height 5' 2"  (1.575 m), weight 146 lb (66.2 kg), SpO2 98 %. Body mass index is 26.7 kg/m.  General: Cooperative, alert, well developed, in no acute distress. HEENT: Conjunctivae and lids unremarkable. Cardiovascular: Regular rhythm.  Lungs: Normal work of breathing. Neurologic: No focal deficits.   Lab Results  Component Value Date   CREATININE 1.15 10/09/2020   BUN 51 (H) 10/09/2020   NA 135 10/09/2020   K 4.9 10/09/2020   CL 100 10/09/2020   CO2 26 10/09/2020   Lab Results  Component Value Date   ALT 20 10/09/2020   AST 23 10/09/2020   ALKPHOS 46 10/09/2020   BILITOT 0.9 10/09/2020   Lab Results  Component Value Date   HGBA1C 5.1 06/24/2019   Lab Results  Component Value Date   INSULIN 5.6 06/24/2019   Lab Results  Component Value Date   TSH 1.75 10/09/2020   Lab Results  Component Value Date   CHOL 131 10/09/2020   HDL 65.70 10/09/2020   LDLCALC 42 10/09/2020   TRIG 117.0 10/09/2020   CHOLHDL 2  10/09/2020   Lab Results  Component Value Date   VD25OH 75.6 06/22/2020   VD25OH 89.6 03/16/2020   VD25OH 57 10/26/2018   Lab Results  Component Value Date   WBC 7.1 10/09/2020   HGB 13.6 10/09/2020   HCT 39.8 10/09/2020   MCV 96.3 10/09/2020   PLT 281.0 10/09/2020   Lab  Results  Component Value Date   IRON 88 05/28/2019   FERRITIN 88.6 05/28/2019    Attestation Statements:   Reviewed by clinician on day of visit: allergies, medications, problem list, medical history, surgical history, family history, social history, and previous encounter notes.  Coral Ceo, CMA, am acting as Location manager for [employer], DO.  I have reviewed the above documentation for accuracy and completeness, and I agree with the above. Jearld Lesch, DO

## 2021-02-11 ENCOUNTER — Encounter (INDEPENDENT_AMBULATORY_CARE_PROVIDER_SITE_OTHER): Payer: Self-pay | Admitting: Bariatrics

## 2021-02-16 ENCOUNTER — Telehealth: Payer: Self-pay | Admitting: Family Medicine

## 2021-02-16 NOTE — Telephone Encounter (Signed)
Pt called wanting to schedule an appointment for labs. Advised pt no active request at the moment. Pt stated she would message pcp and disconnected phone call.

## 2021-02-16 NOTE — Telephone Encounter (Signed)
Did she want to come in for labs beforehand?

## 2021-02-16 NOTE — Telephone Encounter (Signed)
Called the patient and she is needing labs for her Hip replacement. She stated they will be faxing over something from the ortho office for her Hip Replacement surgery coming up.

## 2021-02-16 NOTE — Telephone Encounter (Signed)
Next scheduled appt is on 04/2021

## 2021-02-17 ENCOUNTER — Telehealth: Payer: Self-pay

## 2021-02-17 DIAGNOSIS — Z23 Encounter for immunization: Secondary | ICD-10-CM | POA: Diagnosis not present

## 2021-02-17 DIAGNOSIS — L821 Other seborrheic keratosis: Secondary | ICD-10-CM | POA: Diagnosis not present

## 2021-02-17 DIAGNOSIS — D1801 Hemangioma of skin and subcutaneous tissue: Secondary | ICD-10-CM | POA: Diagnosis not present

## 2021-02-17 DIAGNOSIS — L570 Actinic keratosis: Secondary | ICD-10-CM | POA: Diagnosis not present

## 2021-02-17 DIAGNOSIS — L814 Other melanin hyperpigmentation: Secondary | ICD-10-CM | POA: Diagnosis not present

## 2021-02-17 DIAGNOSIS — Z85828 Personal history of other malignant neoplasm of skin: Secondary | ICD-10-CM | POA: Diagnosis not present

## 2021-02-17 NOTE — Telephone Encounter (Signed)
Pt has appt 03/09/21 with Dr. Geraldo Pitter. I will forward notes to MD for pre op clearance. Will send FYI to surgeon pt has appt 03/09/21.

## 2021-02-17 NOTE — Telephone Encounter (Signed)
   Name: Shannon Bond  DOB: Nov 13, 1940  MRN: [id number]  Primary Cardiologist: Jenean Lindau, MD  Chart reviewed as part of pre-operative protocol coverage. Because of Shannon Bond's past medical history and time since last visit, she will require a follow-up visit in order to better assess preoperative cardiovascular risk.  Pre-op covering staff: - Please schedule appointment and call patient to inform them. If patient already had an upcoming appointment within acceptable timeframe, please add "pre-op clearance" to the appointment notes so provider is aware. - Please contact requesting surgeon's office via preferred method (i.e, phone, fax) to inform them of need for appointment prior to surgery.  If applicable, this message will also be routed to pharmacy pool and/or primary cardiologist for input on holding anticoagulant/antiplatelet agent as requested below so that this information is available to the clearing provider at time of patient's appointment.   Ledora Bottcher, PA  02/17/2021, 12:47 PM

## 2021-02-17 NOTE — Telephone Encounter (Signed)
Left message to call back to schedule an appt for pre op clearance with Dr. Geraldo Pitter or APP.

## 2021-02-17 NOTE — Telephone Encounter (Signed)
   [facility] Pre-operative Risk Assessment    Patient Name: Shannon Bond  DOB: March 15, 1941 MRN: [id number]  HEARTCARE STAFF:  - IMPORTANT!!!!!! Under Visit Info/Reason for Call, type in Other and utilize the format Clearance MM/DD/YY or Clearance TBD. Do not use dashes or single digits. - Please review there is not already an duplicate clearance open for this procedure. - If request is for dental extraction, please clarify the # of teeth to be extracted. - If the patient is currently at the dentist's office, call Pre-Op Callback Staff (MA/nurse) to input urgent request.  - If the patient is not currently in the dentist office, please route to the Pre-Op pool.  Request for surgical clearance:  What type of surgery is being performed? Left total hip arthroplasty  When is this surgery scheduled? 03/24/21  What type of clearance is required (medical clearance vs. Pharmacy clearance to hold med vs. Both)? Both  Are there any medications that need to be held prior to surgery and how long? Anticoags and Antiplatelets  Practice name and name of physician performing surgery? [facility]- Dr. Lowella Petties  What is the office phone number? [phone]   7.   What is the office fax number? [phone] Attention Marcelo Baldy  8.   Anesthesia type (None, local, MAC, general) ? Spinal   Lowella Grip 02/17/2021, 11:07 AM  _________________________________________________________________   (provider comments below)

## 2021-02-19 ENCOUNTER — Encounter: Payer: Self-pay | Admitting: Family Medicine

## 2021-02-19 ENCOUNTER — Ambulatory Visit (INDEPENDENT_AMBULATORY_CARE_PROVIDER_SITE_OTHER): Payer: PPO | Admitting: Family Medicine

## 2021-02-19 ENCOUNTER — Other Ambulatory Visit: Payer: Self-pay

## 2021-02-19 VITALS — BP 124/68 | HR 86 | Temp 96.6°F | Ht 62.0 in | Wt 150.6 lb

## 2021-02-19 DIAGNOSIS — Z01818 Encounter for other preprocedural examination: Secondary | ICD-10-CM

## 2021-02-19 DIAGNOSIS — M25552 Pain in left hip: Secondary | ICD-10-CM | POA: Diagnosis not present

## 2021-02-19 MED ORDER — TRAMADOL HCL 50 MG PO TABS
50.0000 mg | ORAL_TABLET | Freq: Two times a day (BID) | ORAL | 0 refills | Status: DC | PRN
Start: 2021-02-19 — End: 2021-03-08

## 2021-02-19 NOTE — Patient Instructions (Signed)
Give [place] 2-3 business days to get the results of your labs back.   No NSAIDs within 5 days of your procedure. I will otherwise defer to your surgeon.   Let us know if you need anything.

## 2021-02-19 NOTE — Progress Notes (Signed)
Subjective:   Chief Complaint  Patient presents with   Pre-op Exam    Pt here for pre-op.     Shannon Bond  is here for a Pre-operative physical at the request of Dr. Berenice Primas.   She  is having L hip arthroplasty surgery on 03/26/21 for L hip OA.  Personal or family hx of adverse outcome to anesthesia? No  Chipped, cracked, missing, or loose teeth? No  Decreased ROM of neck? No  Able to walk up 2 flights of stairs without becoming significantly short of breath or having chest pain? Yes   Revised Goldman Criteria: High Risk Surgery (intraperitoneal, intrathoracic, aortic): No  Ischemic heart disease (Prior MI, +excercise stress test, angina, nitrate use, Qwave): No  History of heart failure: No  History of cerebrovascular disease: No  History of diabetes: No  Insulin therapy for DM: No  Preoperative Cr >2.0: No   Revised Goldman Criteria - risk for major cardiac death No risk factors -- 0.4 percent One risk factor -- 1.0 percent  Two risk factors -- 2.4 percent  Three or more risk factors -- 5.4 percent   Patient Active Problem List   Diagnosis Date Noted   Moderate aortic regurgitation 10/11/2019   Chronic laryngitis 07/22/2019   Chronic rhinitis 07/22/2019   Dysphagia 07/22/2019   Laryngopharyngeal reflux 07/22/2019   Sensorineural hearing loss 07/22/2019   Subjective tinnitus 07/22/2019   Thyroid nodule 07/22/2019   DOE (dyspnea on exertion) 07/02/2019   Cardiac murmur 07/02/2019   Osteoporosis 04/01/2019   Obesity (BMI 30-39.9) 04/01/2019   Essential hypertension 01/24/2018   Hypothyroidism 01/24/2018   Past Medical History:  Diagnosis Date   Alcohol abuse    Allergy    Alopecia    Arthritis    Celiac disease    Diverticulitis    Edema of both lower extremities    Essential hypertension 01/24/2018   GERD (gastroesophageal reflux disease)    Hypertension    Hyperthyroidism    Hypothyroidism 01/24/2018   IBS (irritable bowel syndrome)    Obesity (BMI 30-39.9)  04/01/2019   Osteoarthritis    Osteoporosis 04/01/2019   Thyroid disease     Past Surgical History:  Procedure Laterality Date   [place]     2007 said 13 years ago. Said that she does the hemoocult cards test at home    ESOPHAGOGASTRODUODENOSCOPY     2014   REPLACEMENT TOTAL KNEE BILATERAL  2003    Current Outpatient Medications  Medication Sig Dispense Refill   traMADol (ULTRAM) 50 MG tablet Take 1 tablet (50 mg total) by mouth every 12 (twelve) hours as needed. 30 tablet 0   acetaminophen (TYLENOL) 325 MG tablet Take 650 mg by mouth every 6 (six) hours as needed.     alendronate (FOSAMAX) 70 MG tablet TAKE 1 TABLET BY MOUTH ONCE A WEEK. TAKE WITH A FULL GLASS OF WATER ON AN EMPTY STOMACH. 12 tablet 2   cetirizine (ZYRTEC) 10 MG tablet Take 10 mg by mouth daily.     estradiol (ESTRACE) 0.1 MG/GM vaginal cream PLACE 1 APPLICATORFUL VAGINALLY AT BEDTIME. 42.5 g 1   hydrochlorothiazide (HYDRODIURIL) 25 MG tablet Take 1 tablet (25 mg total) by mouth daily. 90 tablet 1   ipratropium (ATROVENT) 0.06 % nasal spray Place 2 sprays into both nostrils 3 (three) times daily. 135 mL 3   lactobacillus acidophilus (BACID) TABS tablet Take 2 tablets by mouth daily.  levothyroxine (SYNTHROID) 112 MCG tablet TAKE 1 TABLET BY MOUTH EVERY DAY BEFORE BREAKFAST 90 tablet 0   lisinopril (ZESTRIL) 30 MG tablet TAKE 1 TABLET BY MOUTH EVERY DAY 90 tablet 1   Melatonin-Pyridoxine (MELATIN) 3-1 MG TABS Take by mouth.     Multiple Vitamin (MULTIVITAMIN WITH MINERALS) TABS tablet Take 1 tablet by mouth daily.     PAPAYA ENZYME PO Take by mouth.     rosuvastatin (CRESTOR) 20 MG tablet TAKE 1 TABLET BY MOUTH EVERY DAY 90 tablet 3   S-Adenosylmethionine (SAM-E) 400 MG TABS Take by mouth 2 (two) times daily.     Triamcinolone Acetonide (NASACORT ALLERGY 24HR NA) Place into the nose.     Allergies  Allergen Reactions   Bactrim  [Sulfamethoxazole-Trimethoprim]     Nausea, Vomiting, Abdominal Pain   Chlorine    Latex    Macrobid [Nitrofurantoin]     Diarrhea   Nsaids     Said it upset her stomach really bad    Penicillins     Family History  Problem Relation Age of Onset   Hypertension Mother    Early death Mother        died at 51 in a fire   AAA (abdominal aortic aneurysm) Mother    Alcohol abuse Father    Early death Father        died at 79   Lung cancer Father    Diabetes Son    Cancer Maternal Grandmother        breast cancer   Breast cancer Maternal Grandmother    Mental illness Maternal Grandfather    Esophageal cancer Neg Hx    Colon cancer Neg Hx    Rectal cancer Neg Hx    Stomach cancer Neg Hx      Review of Systems:  Constitutional:  no fevers Eye:  no recent significant change in vision Ear:  no hearing loss Nose/Mouth/Throat:  No dental complaints Neck/Thyroid:  no lumps or masses Pulmonary:  No shortness of breath Cardiovascular:  no chest pain Gastrointestinal:  no abdominal pain GU:  negative for dysuria Musculoskeletal/Extremities:  no pain Skin/Integumentary ROS:  no abnormal skin lesions reported Neurologic:  no HA   Objective:   Vitals:   02/19/21 1349  BP: 124/68  Pulse: 86  Temp: (!) 96.6 F (35.9 C)  TempSrc: Temporal  SpO2: 99%  Weight: 150 lb 9.6 oz (68.3 kg)  Height: 5' 2"  (1.575 m)   Body mass index is 27.55 kg/m.  General:  well developed, well nourished, in no apparent distress Skin:  warm, no pallor or diaphoresis Head:  normocephalic, atraumatic Eyes:  pupils equal and round, sclera anicteric without injection Ears:  canals without lesions, TMs shiny without retraction, no obvious effusion, no erythema Throat/Pharynx:  lips and gingiva without lesion; tongue and uvula midline; non-inflamed pharynx; no exudates or postnasal drainage Neck: neck supple without adenopathy, thyromegaly, or masses, no bruits, no jugular venous distention Lungs:   clear to auscultation, breath sounds equal bilaterally, no respiratory distress Cardio:  regular rate and rhythm without murmurs Musculoskeletal:  symmetrical muscle groups noted without atrophy or deformity Extremities:  no clubbing, cyanosis, or edema, no deformities, no skin discoloration Neuro:  gait antalgic Psych: Age appropriate judgment and insight; normal mood  Assessment:   Preop examination - Plan: EKG 12-Lead  Left hip pain - Plan: traMADol (ULTRAM) 50 MG tablet   Plan:   Order future labs as above in 2 weeks. Not on antiplatelets or  anticoag. Barring abn, no contraindications for surg.  EKG - EKG shows NSR, normal axis, no interval abnormalities, no ST segment or T wave changes, good R wave progression.  The above laboratory work was ordered and will be sent with this physical. Pending the above workup, the patient is deemed low cardiac risk for the proposed procedure.  The patient voiced understanding and agreement to the plan.  [place], DO 02/19/21  2:26 PM

## 2021-02-20 ENCOUNTER — Other Ambulatory Visit: Payer: Self-pay | Admitting: Family Medicine

## 2021-02-26 ENCOUNTER — Other Ambulatory Visit: Payer: Self-pay | Admitting: Family Medicine

## 2021-02-26 ENCOUNTER — Other Ambulatory Visit (INDEPENDENT_AMBULATORY_CARE_PROVIDER_SITE_OTHER): Payer: PPO

## 2021-02-26 ENCOUNTER — Other Ambulatory Visit: Payer: Self-pay

## 2021-02-26 DIAGNOSIS — Z01818 Encounter for other preprocedural examination: Secondary | ICD-10-CM | POA: Diagnosis not present

## 2021-02-26 DIAGNOSIS — D72818 Other decreased white blood cell count: Secondary | ICD-10-CM

## 2021-02-26 DIAGNOSIS — R7989 Other specified abnormal findings of blood chemistry: Secondary | ICD-10-CM

## 2021-02-26 LAB — BASIC METABOLIC PANEL
BUN: 50 mg/dL — ABNORMAL HIGH (ref 6–23)
CO2: 24 mEq/L (ref 19–32)
Calcium: 9.4 mg/dL (ref 8.4–10.5)
Chloride: 105 mEq/L (ref 96–112)
Creatinine, Ser: 0.92 mg/dL (ref 0.40–1.20)
GFR: 59.21 mL/min — ABNORMAL LOW (ref >60.00)
Glucose, Bld: 86 mg/dL (ref 70–99)
Potassium: 4.8 mEq/L (ref 3.5–5.1)
Sodium: 134 mEq/L — ABNORMAL LOW (ref 135–145)

## 2021-02-26 LAB — CBC WITH DIFFERENTIAL/PLATELET
Basophils Absolute: 0.1 10*3/uL (ref 0.0–0.1)
Basophils Relative: 0.8 % (ref 0.0–3.0)
Eosinophils Absolute: 0.2 10*3/uL (ref 0.0–0.7)
Eosinophils Relative: 3 % (ref 0.0–5.0)
HCT: 35.8 % — ABNORMAL LOW (ref 36.0–46.0)
Hemoglobin: 11.9 g/dL — ABNORMAL LOW (ref 12.0–15.0)
Lymphocytes Relative: 22.8 % (ref 12.0–46.0)
Lymphs Abs: 1.7 10*3/uL (ref 0.7–4.0)
MCHC: 33.2 g/dL (ref 30.0–36.0)
MCV: 97.8 fl (ref 78.0–100.0)
Monocytes Absolute: 0.8 10*3/uL (ref 0.1–1.0)
Monocytes Relative: 10.2 % (ref 3.0–12.0)
Neutro Abs: 4.7 10*3/uL (ref 1.4–7.7)
Neutrophils Relative %: 63.2 % (ref 43.0–77.0)
Platelets: 265 10*3/uL (ref 150.0–400.0)
RBC: 3.66 Mil/uL — ABNORMAL LOW (ref 3.87–5.11)
RDW: 14.7 % (ref 11.5–15.5)
WBC: 7.5 10*3/uL (ref 4.0–10.5)

## 2021-02-26 LAB — HEMOGLOBIN A1C: Hgb A1c MFr Bld: 5.4 % (ref 4.6–6.5)

## 2021-02-26 LAB — PROTIME-INR
INR: 1 ratio (ref 0.8–1.0)
Prothrombin Time: 10.7 s (ref 9.6–13.1)

## 2021-03-01 ENCOUNTER — Other Ambulatory Visit: Payer: Self-pay

## 2021-03-01 ENCOUNTER — Other Ambulatory Visit (INDEPENDENT_AMBULATORY_CARE_PROVIDER_SITE_OTHER): Payer: PPO

## 2021-03-01 DIAGNOSIS — R7989 Other specified abnormal findings of blood chemistry: Secondary | ICD-10-CM | POA: Diagnosis not present

## 2021-03-01 DIAGNOSIS — D72818 Other decreased white blood cell count: Secondary | ICD-10-CM | POA: Diagnosis not present

## 2021-03-01 LAB — BASIC METABOLIC PANEL
BUN: 56 mg/dL — ABNORMAL HIGH (ref 6–23)
CO2: 24 mEq/L (ref 19–32)
Calcium: 9.4 mg/dL (ref 8.4–10.5)
Chloride: 105 mEq/L (ref 96–112)
Creatinine, Ser: 0.88 mg/dL (ref 0.40–1.20)
GFR: 62.45 mL/min (ref >60.00)
Glucose, Bld: 97 mg/dL (ref 70–99)
Potassium: 4.7 mEq/L (ref 3.5–5.1)
Sodium: 136 mEq/L (ref 135–145)

## 2021-03-01 LAB — CBC
HCT: 36.9 % (ref 36.0–46.0)
Hemoglobin: 12.2 g/dL (ref 12.0–15.0)
MCHC: 33 g/dL (ref 30.0–36.0)
MCV: 97.1 fl (ref 78.0–100.0)
Platelets: 276 10*3/uL (ref 150.0–400.0)
RBC: 3.8 Mil/uL — ABNORMAL LOW (ref 3.87–5.11)
RDW: 14.3 % (ref 11.5–15.5)
WBC: 8.8 10*3/uL (ref 4.0–10.5)

## 2021-03-04 DIAGNOSIS — M1612 Unilateral primary osteoarthritis, left hip: Secondary | ICD-10-CM | POA: Diagnosis not present

## 2021-03-08 ENCOUNTER — Encounter (INDEPENDENT_AMBULATORY_CARE_PROVIDER_SITE_OTHER): Payer: Self-pay | Admitting: Bariatrics

## 2021-03-08 ENCOUNTER — Ambulatory Visit (INDEPENDENT_AMBULATORY_CARE_PROVIDER_SITE_OTHER): Payer: PPO | Admitting: Bariatrics

## 2021-03-08 ENCOUNTER — Other Ambulatory Visit: Payer: Self-pay

## 2021-03-08 VITALS — BP 144/77 | HR 79 | Temp 97.6°F | Ht 62.0 in | Wt 146.0 lb

## 2021-03-08 DIAGNOSIS — I10 Essential (primary) hypertension: Secondary | ICD-10-CM | POA: Insufficient documentation

## 2021-03-08 DIAGNOSIS — E038 Other specified hypothyroidism: Secondary | ICD-10-CM

## 2021-03-08 DIAGNOSIS — K5792 Diverticulitis of intestine, part unspecified, without perforation or abscess without bleeding: Secondary | ICD-10-CM | POA: Insufficient documentation

## 2021-03-08 DIAGNOSIS — Z6833 Body mass index (BMI) 33.0-33.9, adult: Secondary | ICD-10-CM

## 2021-03-08 DIAGNOSIS — E0590 Thyrotoxicosis, unspecified without thyrotoxic crisis or storm: Secondary | ICD-10-CM | POA: Insufficient documentation

## 2021-03-08 DIAGNOSIS — F1010 Alcohol abuse, uncomplicated: Secondary | ICD-10-CM | POA: Insufficient documentation

## 2021-03-08 DIAGNOSIS — L219 Seborrheic dermatitis, unspecified: Secondary | ICD-10-CM | POA: Insufficient documentation

## 2021-03-08 DIAGNOSIS — K900 Celiac disease: Secondary | ICD-10-CM | POA: Insufficient documentation

## 2021-03-08 DIAGNOSIS — D099 Carcinoma in situ, unspecified: Secondary | ICD-10-CM | POA: Insufficient documentation

## 2021-03-08 DIAGNOSIS — E669 Obesity, unspecified: Secondary | ICD-10-CM

## 2021-03-08 DIAGNOSIS — K589 Irritable bowel syndrome without diarrhea: Secondary | ICD-10-CM | POA: Insufficient documentation

## 2021-03-08 DIAGNOSIS — R600 Localized edema: Secondary | ICD-10-CM | POA: Insufficient documentation

## 2021-03-08 DIAGNOSIS — L659 Nonscarring hair loss, unspecified: Secondary | ICD-10-CM | POA: Insufficient documentation

## 2021-03-08 DIAGNOSIS — Z85828 Personal history of other malignant neoplasm of skin: Secondary | ICD-10-CM | POA: Insufficient documentation

## 2021-03-08 DIAGNOSIS — E079 Disorder of thyroid, unspecified: Secondary | ICD-10-CM | POA: Insufficient documentation

## 2021-03-08 DIAGNOSIS — M1990 Unspecified osteoarthritis, unspecified site: Secondary | ICD-10-CM | POA: Insufficient documentation

## 2021-03-08 DIAGNOSIS — K219 Gastro-esophageal reflux disease without esophagitis: Secondary | ICD-10-CM | POA: Insufficient documentation

## 2021-03-08 DIAGNOSIS — T7840XA Allergy, unspecified, initial encounter: Secondary | ICD-10-CM | POA: Insufficient documentation

## 2021-03-08 HISTORY — DX: Irritable bowel syndrome, unspecified: K58.9

## 2021-03-08 HISTORY — DX: Carcinoma in situ, unspecified: D09.9

## 2021-03-08 HISTORY — DX: Seborrheic dermatitis, unspecified: L21.9

## 2021-03-08 HISTORY — DX: Thyrotoxicosis, unspecified without thyrotoxic crisis or storm: E05.90

## 2021-03-08 HISTORY — DX: Essential (primary) hypertension: I10

## 2021-03-08 HISTORY — DX: Personal history of other malignant neoplasm of skin: Z85.828

## 2021-03-08 NOTE — Progress Notes (Signed)
Chief Complaint:   OBESITY Shannon Bond is here to discuss her progress with her obesity treatment plan along with follow-up of her obesity related diagnoses. Shannon Bond is on the Category 1 Plan and states she is following her eating plan approximately 100% of the time. Shannon Bond states she is doing cardio and weights for 45 minutes 7 times per week.  Today's visit was #: 4 Starting weight: 183 lbs Starting date: 06/24/2019 Today's weight: 145 lbs Today's date: 03/08/2021 Total lbs lost to date: 38 lbs Total lbs lost since last in-office visit: 1 lbs  Interim History: Shannon Bond is down 1 additional pound since her last visit and has been able to maintain well.  Subjective:   1. Essential hypertension Shannon Bond is currently taking HCTZ and Zestril.  2. Other specified hypothyroidism Shannon Bond is taking Synthroid currently.  Assessment/Plan:   1. Essential hypertension Shannon Bond will continue her medications. She is working on healthy weight loss and exercise to improve blood pressure control. We will watch for signs of hypotension as she continues her lifestyle modifications.  2. Other specified hypothyroidism Shannon Bond will continue Synthyroid. Orders and follow up as documented in patient record.  Counseling Good thyroid control is important for overall health. Supratherapeutic thyroid levels are dangerous and will not improve weight loss results. Counseling: The correct way to take levothyroxine is fasting, with water, separated by at least 30 minutes from breakfast, and separated by more than 4 hours from calcium, iron, multivitamins, acid reflux medications (PPIs).    3. Obesity, current BMI 26.6 Shannon Bond is currently in the action stage of change. As such, her goal is to continue with weight loss efforts. She has agreed to the Category 1 Plan.   Shannon Bond will continue meal planning and she will continue intentional eating. She will track her protein and keep it at 80 grams.  Exercise goals:  As  is.  Behavioral modification strategies: increasing lean protein intake, decreasing simple carbohydrates, increasing vegetables, increasing water intake, decreasing eating out, no skipping meals, meal planning and cooking strategies, keeping healthy foods in the home, and planning for success.  Shannon Bond has agreed to follow-up with our clinic in 6-7 weeks. She was informed of the importance of frequent follow-up visits to maximize her success with intensive lifestyle modifications for her multiple health conditions.   Objective:   Blood pressure (!) 144/77, pulse 79, temperature 97.6 F (36.4 C), height 5' 2"  (1.575 m), weight 146 lb (66.2 kg), SpO2 99 %. Body mass index is 26.7 kg/m.  General: Cooperative, alert, well developed, in no acute distress. HEENT: Conjunctivae and lids unremarkable. Cardiovascular: Regular rhythm.  Lungs: Normal work of breathing. Neurologic: No focal deficits.   Lab Results  Component Value Date   CREATININE 0.88 03/01/2021   BUN 56 (H) 03/01/2021   NA 136 03/01/2021   K 4.7 03/01/2021   CL 105 03/01/2021   CO2 24 03/01/2021   Lab Results  Component Value Date   ALT 20 10/09/2020   AST 23 10/09/2020   ALKPHOS 46 10/09/2020   BILITOT 0.9 10/09/2020   Lab Results  Component Value Date   HGBA1C 5.4 02/26/2021   HGBA1C 5.1 06/24/2019   Lab Results  Component Value Date   INSULIN 5.6 06/24/2019   Lab Results  Component Value Date   TSH 1.75 10/09/2020   Lab Results  Component Value Date   CHOL 131 10/09/2020   HDL 65.70 10/09/2020   LDLCALC 42 10/09/2020   TRIG 117.0 10/09/2020   CHOLHDL  2 10/09/2020   Lab Results  Component Value Date   VD25OH 75.6 06/22/2020   VD25OH 89.6 03/16/2020   VD25OH 57 10/26/2018   Lab Results  Component Value Date   WBC 8.8 03/01/2021   HGB 12.2 03/01/2021   HCT 36.9 03/01/2021   MCV 97.1 03/01/2021   PLT 276.0 03/01/2021   Lab Results  Component Value Date   IRON 88 05/28/2019   FERRITIN 88.6  05/28/2019   Attestation Statements:   Reviewed by clinician on day of visit: allergies, medications, problem list, medical history, surgical history, family history, social history, and previous encounter notes.  I, Lizbeth Bark, RMA, am acting as Location manager for [employer], DO.   I have reviewed the above documentation for accuracy and completeness, and I agree with the above. Jearld Lesch, DO

## 2021-03-09 ENCOUNTER — Encounter: Payer: Self-pay | Admitting: Cardiology

## 2021-03-09 ENCOUNTER — Encounter (INDEPENDENT_AMBULATORY_CARE_PROVIDER_SITE_OTHER): Payer: Self-pay | Admitting: Bariatrics

## 2021-03-09 ENCOUNTER — Ambulatory Visit: Payer: PPO | Admitting: Cardiology

## 2021-03-09 VITALS — BP 150/70 | HR 78 | Ht 62.0 in | Wt 150.1 lb

## 2021-03-09 DIAGNOSIS — I351 Nonrheumatic aortic (valve) insufficiency: Secondary | ICD-10-CM

## 2021-03-09 DIAGNOSIS — Z01810 Encounter for preprocedural cardiovascular examination: Secondary | ICD-10-CM

## 2021-03-09 DIAGNOSIS — I10 Essential (primary) hypertension: Secondary | ICD-10-CM

## 2021-03-09 HISTORY — DX: Encounter for preprocedural cardiovascular examination: Z01.810

## 2021-03-09 NOTE — Progress Notes (Signed)
Cardiology Office Note:    Date:  03/09/2021   ID:  Shannon Bond, DOB 1941/02/14, MRN [id number]  PCP:  Shelda Pal, DO  Cardiologist:  Jenean Lindau, MD   Referring MD: Shelda Pal*    ASSESSMENT:    1. Essential hypertension   2. Moderate aortic regurgitation   3. Preop cardiovascular exam    PLAN:    In order of problems listed above:  Primary prevention stressed with the patient.  Importance of compliance with diet medication stressed and she vocalized understanding. Essential hypertension: Blood pressure stable.  She mentioned blood pressure readings are fine at home and she mentioned the readings and they look good.  She will keep a track of them at home.  She has an element of whitecoat hypertension. Aortic regurgitation: Moderate in nature.  I would like to get a quick echocardiogram to understand this. Preoperative stratification: I reviewed the results of the echocardiogram but overall her effort tolerance is excellent.  She tells me that she walked 2 miles this morning and based on the guidelines she is not at high risk for coronary events during the aforementioned surgery.  Medical hemodynamic monitoring will further reduce the risk of coronary events.  Typically in aortic regurgitation we tend to avoid bradycardia as it makes the regurgitation more significant.  Attention needs to be paid to this particular issue. Please do not hesitate to contact us with any questions in her cardiovascular management.   Medication Adjustments/Labs and Tests Ordered: Current medicines are reviewed at length with the patient today.  Concerns regarding medicines are outlined above.  No orders of the defined types were placed in this encounter.  No orders of the defined types were placed in this encounter.    No chief complaint on file.    History of Present Illness:    Shannon Bond is a 80 y.o. female.  Patient has past medical history of moderate  aortic regurgitation, mixed dyslipidemia and essential hypertension.  She denies any problems at this time and takes care of activities of daily living.  She is here for preop assessment.  She is here for hip replacement evaluation from a cardiovascular standpoint.  She denies any chest pain orthopnea or PND.  At the time of my evaluation, the patient is alert awake oriented and in no distress.  She tells me that she walked 2 miles today without much of symptoms.  She tells me that her orthopedic surgeon has encouraged her to continue to walk in spite of pain issues.  Past Medical History:  Diagnosis Date   Alcohol abuse    Allergy    Alopecia    Arthritis    Cardiac murmur 07/02/2019   Celiac disease    Chronic laryngitis 07/22/2019   Chronic rhinitis 07/22/2019   Diverticulitis    DOE (dyspnea on exertion) 07/02/2019   Dysphagia 07/22/2019   Edema of both lower extremities    Essential hypertension 01/24/2018   GERD (gastroesophageal reflux disease)    History of malignant neoplasm of skin 03/08/2021   Hypertension    Hyperthyroidism    Hypothyroidism 01/24/2018   IBS (irritable bowel syndrome)    Laryngopharyngeal reflux 07/22/2019   Moderate aortic regurgitation 10/11/2019   Obesity (BMI 30-39.9) 04/01/2019   Osteoarthritis    Osteoporosis 04/01/2019   Seborrheic dermatitis of scalp 03/08/2021   Sensorineural hearing loss 07/22/2019   Squamous cell carcinoma in situ 03/08/2021   Subjective tinnitus 07/22/2019   Thyroid disease  Thyroid nodule 07/22/2019    Past Surgical History:  Procedure Laterality Date   [place]     2007 said 13 years ago. Said that she does the hemoocult cards test at home    ESOPHAGOGASTRODUODENOSCOPY     2014   [place] BILATERAL  2003    Current Medications: No outpatient medications have been marked as taking for the 03/09/21 encounter (Office Visit) with Lianne Carreto, Reita Cliche, MD.      Allergies:   Bactrim [sulfamethoxazole-trimethoprim], Chlorine, Latex, Macrobid [nitrofurantoin], Nsaids, and Penicillins   Social History   Socioeconomic History   Marital status: Married    Spouse name: Wilfored   Number of children: 3   Years of education: Not on file   Highest education level: Not on file  Occupational History   Occupation: Retired   Tobacco Use   Smoking status: Never   Smokeless tobacco: Never   Tobacco comments:    admit to trying to smoke   Vaping Use   Vaping Use: Never used  Substance and Sexual Activity   Alcohol use: Not Currently    Comment: Patient is a alcoholic   Drug use: Never   Sexual activity: Not on file  Other Topics Concern   Not on file  Social History Narrative   Not on file   Social Determinants of Health   Financial Resource Strain: Not on file  Food Insecurity: Not on file  Transportation Needs: Not on file  Physical Activity: Not on file  Stress: Not on file  Social Connections: Not on file     Family History: The patient's family history includes AAA (abdominal aortic aneurysm) in her mother; Alcohol abuse in her father; Breast cancer in her maternal grandmother; Cancer in her maternal grandmother; Diabetes in her son; Early death in her father and mother; Hypertension in her mother; Lung cancer in her father; Mental illness in her maternal grandfather. There is no history of Esophageal cancer, Colon cancer, Rectal cancer, or Stomach cancer.  ROS:   Please see the history of present illness.    All other systems reviewed and are negative.  EKGs/Labs/Other Studies Reviewed:    The following studies were reviewed today: EKG reveals sinus rhythm and nonspecific ST-T changes   Recent Labs: 10/09/2020: ALT 20; TSH 1.75 03/01/2021: BUN 56; Creatinine, Ser 0.88; Hemoglobin 12.2; Platelets 276.0; Potassium 4.7; Sodium 136  Recent Lipid Panel    Component Value Date/Time   CHOL 131 10/09/2020 1009   TRIG 117.0  10/09/2020 1009   HDL 65.70 10/09/2020 1009   CHOLHDL 2 10/09/2020 1009   VLDL 23.4 10/09/2020 1009   LDLCALC 42 10/09/2020 1009    Physical Exam:    VS:  BP (!) 150/70   Pulse 78   Ht 5' 2"  (1.575 m)   Wt 150 lb 1.3 oz (68.1 kg)   SpO2 97%   BMI 27.45 kg/m     Wt Readings from Last 3 Encounters:  03/09/21 150 lb 1.3 oz (68.1 kg)  03/08/21 146 lb (66.2 kg)  02/19/21 150 lb 9.6 oz (68.3 kg)     GEN: Patient is in no acute distress HEENT: Normal NECK: No JVD; No carotid bruits LYMPHATICS: No lymphadenopathy CARDIAC: Hear sounds regular, 2/6 systolic murmur at the apex. RESPIRATORY:  Clear to auscultation without rales, wheezing or rhonchi  ABDOMEN: Soft, non-tender, non-distended MUSCULOSKELETAL:  No edema; No deformity  SKIN: Warm and  dry NEUROLOGIC:  Alert and oriented x 3 PSYCHIATRIC:  Normal affect   Signed, Jenean Lindau, MD  03/09/2021 3:41 PM    [facility]

## 2021-03-09 NOTE — Patient Instructions (Signed)
Medication Instructions:  Your physician recommends that you continue on your current medications as directed. Please refer to the Current Medication list given to you today.  *If you need a refill on your cardiac medications before your next appointment, please call your pharmacy*   Lab Work: None If you have labs (blood work) drawn today and your tests are completely normal, you will receive your results only by: [place] (if you have MyChart) OR A paper copy in the mail If you have any lab test that is abnormal or we need to change your treatment, we will call you to review the results.   Testing/Procedures: Your physician has requested that you have an echocardiogram. Echocardiography is a painless test that uses sound waves to create images of your heart. It provides your doctor with information about the size and shape of your heart and how well your heart's chambers and valves are working. This procedure takes approximately one hour. There are no restrictions for this procedure.    Follow-Up: At [facility], you and your health needs are our priority.  As part of our continuing mission to provide you with exceptional heart care, we have created designated Provider Care Teams.  These Care Teams include your primary Cardiologist (physician) and Advanced Practice Providers (APPs -  Physician Assistants and Nurse Practitioners) who all work together to provide you with the care you need, when you need it.  We recommend signing up for the patient portal called "MyChart".  Sign up information is provided on this After Visit Summary.  MyChart is used to connect with patients for Virtual Visits (Telemedicine).  Patients are able to view lab/test results, encounter notes, upcoming appointments, etc.  Non-urgent messages can be sent to your provider as well.   To learn more about what you can do with MyChart, go to [email or web address].    Your next appointment:   6  month(s)  The format for your next appointment:   In Person  Provider:   Jyl Heinz, MD    Other Instructions

## 2021-03-10 ENCOUNTER — Encounter: Payer: Self-pay | Admitting: Family Medicine

## 2021-03-10 ENCOUNTER — Other Ambulatory Visit: Payer: Self-pay | Admitting: Family Medicine

## 2021-03-10 ENCOUNTER — Ambulatory Visit (HOSPITAL_COMMUNITY): Payer: PPO | Attending: Cardiology

## 2021-03-10 ENCOUNTER — Other Ambulatory Visit: Payer: Self-pay

## 2021-03-10 DIAGNOSIS — I10 Essential (primary) hypertension: Secondary | ICD-10-CM | POA: Diagnosis not present

## 2021-03-10 DIAGNOSIS — Z01810 Encounter for preprocedural cardiovascular examination: Secondary | ICD-10-CM | POA: Insufficient documentation

## 2021-03-10 DIAGNOSIS — M25552 Pain in left hip: Secondary | ICD-10-CM

## 2021-03-10 DIAGNOSIS — I351 Nonrheumatic aortic (valve) insufficiency: Secondary | ICD-10-CM | POA: Insufficient documentation

## 2021-03-10 LAB — ECHOCARDIOGRAM COMPLETE
Area-P 1/2: 2.82 cm2
P 1/2 time: 440 msec
S' Lateral: 2.9 cm

## 2021-03-16 ENCOUNTER — Encounter: Payer: Self-pay | Admitting: Pharmacist

## 2021-03-16 DIAGNOSIS — I700 Atherosclerosis of aorta: Secondary | ICD-10-CM

## 2021-03-16 HISTORY — DX: Atherosclerosis of aorta: I70.0

## 2021-03-18 DIAGNOSIS — M1612 Unilateral primary osteoarthritis, left hip: Secondary | ICD-10-CM | POA: Diagnosis not present

## 2021-03-24 DIAGNOSIS — M1612 Unilateral primary osteoarthritis, left hip: Secondary | ICD-10-CM | POA: Diagnosis not present

## 2021-03-24 DIAGNOSIS — Z96642 Presence of left artificial hip joint: Secondary | ICD-10-CM | POA: Diagnosis not present

## 2021-03-26 DIAGNOSIS — M25552 Pain in left hip: Secondary | ICD-10-CM | POA: Diagnosis not present

## 2021-03-26 DIAGNOSIS — M25652 Stiffness of left hip, not elsewhere classified: Secondary | ICD-10-CM | POA: Diagnosis not present

## 2021-03-26 DIAGNOSIS — M6281 Muscle weakness (generalized): Secondary | ICD-10-CM | POA: Diagnosis not present

## 2021-03-26 DIAGNOSIS — Z96642 Presence of left artificial hip joint: Secondary | ICD-10-CM | POA: Diagnosis not present

## 2021-03-30 DIAGNOSIS — M25652 Stiffness of left hip, not elsewhere classified: Secondary | ICD-10-CM | POA: Diagnosis not present

## 2021-03-30 DIAGNOSIS — M25552 Pain in left hip: Secondary | ICD-10-CM | POA: Diagnosis not present

## 2021-03-30 DIAGNOSIS — M6281 Muscle weakness (generalized): Secondary | ICD-10-CM | POA: Diagnosis not present

## 2021-03-30 DIAGNOSIS — Z96642 Presence of left artificial hip joint: Secondary | ICD-10-CM | POA: Diagnosis not present

## 2021-04-01 DIAGNOSIS — M25552 Pain in left hip: Secondary | ICD-10-CM | POA: Diagnosis not present

## 2021-04-01 DIAGNOSIS — M25652 Stiffness of left hip, not elsewhere classified: Secondary | ICD-10-CM | POA: Diagnosis not present

## 2021-04-01 DIAGNOSIS — M6281 Muscle weakness (generalized): Secondary | ICD-10-CM | POA: Diagnosis not present

## 2021-04-01 DIAGNOSIS — Z96642 Presence of left artificial hip joint: Secondary | ICD-10-CM | POA: Diagnosis not present

## 2021-04-05 DIAGNOSIS — M25552 Pain in left hip: Secondary | ICD-10-CM | POA: Diagnosis not present

## 2021-04-05 DIAGNOSIS — M6281 Muscle weakness (generalized): Secondary | ICD-10-CM | POA: Diagnosis not present

## 2021-04-05 DIAGNOSIS — Z96642 Presence of left artificial hip joint: Secondary | ICD-10-CM | POA: Diagnosis not present

## 2021-04-05 DIAGNOSIS — M25652 Stiffness of left hip, not elsewhere classified: Secondary | ICD-10-CM | POA: Diagnosis not present

## 2021-04-06 DIAGNOSIS — M1612 Unilateral primary osteoarthritis, left hip: Secondary | ICD-10-CM | POA: Diagnosis not present

## 2021-04-06 DIAGNOSIS — Z9889 Other specified postprocedural states: Secondary | ICD-10-CM | POA: Diagnosis not present

## 2021-04-07 DIAGNOSIS — M25652 Stiffness of left hip, not elsewhere classified: Secondary | ICD-10-CM | POA: Diagnosis not present

## 2021-04-07 DIAGNOSIS — M6281 Muscle weakness (generalized): Secondary | ICD-10-CM | POA: Diagnosis not present

## 2021-04-07 DIAGNOSIS — Z96642 Presence of left artificial hip joint: Secondary | ICD-10-CM | POA: Diagnosis not present

## 2021-04-07 DIAGNOSIS — M25552 Pain in left hip: Secondary | ICD-10-CM | POA: Diagnosis not present

## 2021-04-13 DIAGNOSIS — M6281 Muscle weakness (generalized): Secondary | ICD-10-CM | POA: Diagnosis not present

## 2021-04-13 DIAGNOSIS — M25552 Pain in left hip: Secondary | ICD-10-CM | POA: Diagnosis not present

## 2021-04-13 DIAGNOSIS — M25652 Stiffness of left hip, not elsewhere classified: Secondary | ICD-10-CM | POA: Diagnosis not present

## 2021-04-13 DIAGNOSIS — Z96642 Presence of left artificial hip joint: Secondary | ICD-10-CM | POA: Diagnosis not present

## 2021-04-16 DIAGNOSIS — Z96642 Presence of left artificial hip joint: Secondary | ICD-10-CM | POA: Diagnosis not present

## 2021-04-16 DIAGNOSIS — M25652 Stiffness of left hip, not elsewhere classified: Secondary | ICD-10-CM | POA: Diagnosis not present

## 2021-04-16 DIAGNOSIS — M6281 Muscle weakness (generalized): Secondary | ICD-10-CM | POA: Diagnosis not present

## 2021-04-16 DIAGNOSIS — M25552 Pain in left hip: Secondary | ICD-10-CM | POA: Diagnosis not present

## 2021-04-17 ENCOUNTER — Other Ambulatory Visit: Payer: Self-pay | Admitting: Family Medicine

## 2021-04-21 ENCOUNTER — Ambulatory Visit (INDEPENDENT_AMBULATORY_CARE_PROVIDER_SITE_OTHER): Payer: PPO | Admitting: Bariatrics

## 2021-04-21 ENCOUNTER — Other Ambulatory Visit: Payer: Self-pay

## 2021-04-21 ENCOUNTER — Encounter (INDEPENDENT_AMBULATORY_CARE_PROVIDER_SITE_OTHER): Payer: Self-pay | Admitting: Bariatrics

## 2021-04-21 VITALS — BP 137/77 | HR 87 | Temp 97.9°F | Ht 62.0 in | Wt 146.0 lb

## 2021-04-21 DIAGNOSIS — M6281 Muscle weakness (generalized): Secondary | ICD-10-CM | POA: Diagnosis not present

## 2021-04-21 DIAGNOSIS — Z96642 Presence of left artificial hip joint: Secondary | ICD-10-CM | POA: Diagnosis not present

## 2021-04-21 DIAGNOSIS — M25652 Stiffness of left hip, not elsewhere classified: Secondary | ICD-10-CM | POA: Diagnosis not present

## 2021-04-21 DIAGNOSIS — E669 Obesity, unspecified: Secondary | ICD-10-CM | POA: Diagnosis not present

## 2021-04-21 DIAGNOSIS — Z6833 Body mass index (BMI) 33.0-33.9, adult: Secondary | ICD-10-CM | POA: Diagnosis not present

## 2021-04-21 DIAGNOSIS — I10 Essential (primary) hypertension: Secondary | ICD-10-CM | POA: Diagnosis not present

## 2021-04-21 DIAGNOSIS — E7849 Other hyperlipidemia: Secondary | ICD-10-CM

## 2021-04-21 DIAGNOSIS — M25552 Pain in left hip: Secondary | ICD-10-CM | POA: Diagnosis not present

## 2021-04-21 NOTE — Progress Notes (Signed)
Chief Complaint:   OBESITY Shannon Bond is here to discuss her progress with her obesity treatment plan along with follow-up of her obesity related diagnoses. Shannon Bond is on the Category 1 Plan and states she is following her eating plan approximately 90% of the time. Shannon Bond states she is doing physical therapy and walking for 60 minutes 7 times per week.  Today's visit was #: 58 Starting weight: 183 lbs Starting date: 06/24/2019 Today's weight: 146 lbs Today's date: 04/21/2021 Total lbs lost to date: 37 lbs Total lbs lost since last in-office visit: 0  Interim History: Maram's weight remains the same. She has done well and she has been able to maintain her weight well.   Subjective:   1. Essential hypertension Shannon Bond is currently taking HCTZ and Zestril. Her blood pressure is controlled.   2. Other hyperlipidemia Shannon Bond is taking Crestor currently.   Assessment/Plan:   1. Essential hypertension Shannon Bond will continue her medications. She is working on healthy weight loss and exercise to improve blood pressure control. We will watch for signs of hypotension as she continues her lifestyle modifications.  2. Other hyperlipidemia Cardiovascular risk and specific lipid/LDL goals reviewed.  We discussed several lifestyle modifications today and Shannon Bond will continue to work on diet, exercise and weight loss efforts.Shannon Bond will continue Crestor. She will have zero trans fats and monounsaturated fats.  Orders and follow up as documented in patient record.   Counseling Intensive lifestyle modifications are the first line treatment for this issue. Dietary changes: Increase soluble fiber. Decrease simple carbohydrates. Exercise changes: Moderate to vigorous-intensity aerobic activity 150 minutes per week if tolerated. Lipid-lowering medications: see documented in medical record.  3. Obesity, current BMI 26.8 Shannon Bond is currently in the action stage of change. As such, her goal is to continue  with weight loss efforts. She has agreed to the Category 1 Plan.   Shannon Bond will continue meal planning and intentional eating. She will continue to adhere to the plan. Her goal is to get stronger.   Exercise goals:  As is. She will continue physical therapy twice weekly.  Behavioral modification strategies: increasing lean protein intake, decreasing simple carbohydrates, increasing vegetables, increasing water intake, decreasing eating out, no skipping meals, meal planning and cooking strategies, keeping healthy foods in the home, and planning for success.  Shannon Bond has agreed to follow-up with our clinic in 4 weeks. She was informed of the importance of frequent follow-up visits to maximize her success with intensive lifestyle modifications for her multiple health conditions.   Objective:   Blood pressure 137/77, pulse 87, temperature 97.9 F (36.6 C), height 5' 2"  (1.575 m), weight 146 lb (66.2 kg), SpO2 99 %. Body mass index is 26.7 kg/m. Shannon Bond is using a cane temporary.   General: Cooperative, alert, well developed, in no acute distress. HEENT: Conjunctivae and lids unremarkable. Cardiovascular: Regular rhythm.  Lungs: Normal work of breathing. Neurologic: No focal deficits.   Lab Results  Component Value Date   CREATININE 0.88 03/01/2021   BUN 56 (H) 03/01/2021   NA 136 03/01/2021   K 4.7 03/01/2021   CL 105 03/01/2021   CO2 24 03/01/2021   Lab Results  Component Value Date   ALT 20 10/09/2020   AST 23 10/09/2020   ALKPHOS 46 10/09/2020   BILITOT 0.9 10/09/2020   Lab Results  Component Value Date   HGBA1C 5.4 02/26/2021   HGBA1C 5.1 06/24/2019   Lab Results  Component Value Date   INSULIN 5.6 06/24/2019  Lab Results  Component Value Date   TSH 1.75 10/09/2020   Lab Results  Component Value Date   CHOL 131 10/09/2020   HDL 65.70 10/09/2020   LDLCALC 42 10/09/2020   TRIG 117.0 10/09/2020   CHOLHDL 2 10/09/2020   Lab Results  Component Value Date    VD25OH 75.6 06/22/2020   VD25OH 89.6 03/16/2020   VD25OH 57 10/26/2018   Lab Results  Component Value Date   WBC 8.8 03/01/2021   HGB 12.2 03/01/2021   HCT 36.9 03/01/2021   MCV 97.1 03/01/2021   PLT 276.0 03/01/2021   Lab Results  Component Value Date   IRON 88 05/28/2019   FERRITIN 88.6 05/28/2019   Attestation Statements:   Reviewed by clinician on day of visit: allergies, medications, problem list, medical history, surgical history, family history, social history, and previous encounter notes.  I, Shannon Bond, RMA, am acting as Location manager for [employer], DO.  I have reviewed the above documentation for accuracy and completeness, and I agree with the above. Shannon Lesch, DO

## 2021-04-22 ENCOUNTER — Encounter (INDEPENDENT_AMBULATORY_CARE_PROVIDER_SITE_OTHER): Payer: Self-pay | Admitting: Bariatrics

## 2021-04-23 ENCOUNTER — Ambulatory Visit (INDEPENDENT_AMBULATORY_CARE_PROVIDER_SITE_OTHER): Payer: PPO | Admitting: Family Medicine

## 2021-04-23 ENCOUNTER — Encounter: Payer: Self-pay | Admitting: Family Medicine

## 2021-04-23 VITALS — BP 120/68 | HR 85 | Temp 97.9°F | Ht 62.0 in | Wt 153.0 lb

## 2021-04-23 DIAGNOSIS — I10 Essential (primary) hypertension: Secondary | ICD-10-CM

## 2021-04-23 DIAGNOSIS — E039 Hypothyroidism, unspecified: Secondary | ICD-10-CM | POA: Diagnosis not present

## 2021-04-23 DIAGNOSIS — Z96642 Presence of left artificial hip joint: Secondary | ICD-10-CM | POA: Diagnosis not present

## 2021-04-23 DIAGNOSIS — I700 Atherosclerosis of aorta: Secondary | ICD-10-CM

## 2021-04-23 DIAGNOSIS — R5383 Other fatigue: Secondary | ICD-10-CM | POA: Diagnosis not present

## 2021-04-23 DIAGNOSIS — E7849 Other hyperlipidemia: Secondary | ICD-10-CM | POA: Diagnosis not present

## 2021-04-23 DIAGNOSIS — M810 Age-related osteoporosis without current pathological fracture: Secondary | ICD-10-CM | POA: Diagnosis not present

## 2021-04-23 DIAGNOSIS — M6281 Muscle weakness (generalized): Secondary | ICD-10-CM | POA: Diagnosis not present

## 2021-04-23 DIAGNOSIS — M25552 Pain in left hip: Secondary | ICD-10-CM | POA: Diagnosis not present

## 2021-04-23 DIAGNOSIS — M25652 Stiffness of left hip, not elsewhere classified: Secondary | ICD-10-CM | POA: Diagnosis not present

## 2021-04-23 MED ORDER — ESTRADIOL 0.1 MG/GM VA CREA: TOPICAL_CREAM | VAGINAL | 1 refills | Status: DC

## 2021-04-23 NOTE — Patient Instructions (Signed)
Give [place] 2-3 business days to get the results of your labs back.   Keep the diet clean and stay active.  Continue on the Calcium and Vit D.  If labs are normal, we could consider a sleep specialist referral.   Let us know if you need anything.

## 2021-04-23 NOTE — Progress Notes (Signed)
Chief Complaint  Patient presents with   Follow-up    Subjective Shannon Bond is a 81 y.o. female who presents for hypertension follow up. She does not monitor home blood pressures. She is compliant with medications. Patient has these side effects of medication: none She is adhering to a healthy diet overall. Current exercise: walking, wt resistance exercising, cycling No CP or SOB.   Hyperlipidemia Patient presents for hyperlipidemia follow up. Currently being treated with Crestor 20 mg/d and compliance with treatment thus far has been good. She denies myalgias. Diet/exercise as above.  The patient is not known to have coexisting coronary artery disease.   Past Medical History:  Diagnosis Date   Alcohol abuse    Allergy    Alopecia    Arthritis    Cardiac murmur 07/02/2019   Celiac disease    Chronic laryngitis 07/22/2019   Chronic rhinitis 07/22/2019   Diverticulitis    DOE (dyspnea on exertion) 07/02/2019   Dysphagia 07/22/2019   Edema of both lower extremities    Essential hypertension 01/24/2018   GERD (gastroesophageal reflux disease)    History of malignant neoplasm of skin 03/08/2021   Hypertension    Hyperthyroidism    Hypothyroidism 01/24/2018   IBS (irritable bowel syndrome)    Laryngopharyngeal reflux 07/22/2019   Moderate aortic regurgitation 10/11/2019   Obesity (BMI 30-39.9) 04/01/2019   Osteoarthritis    Osteoporosis 04/01/2019   Seborrheic dermatitis of scalp 03/08/2021   Sensorineural hearing loss 07/22/2019   Squamous cell carcinoma in situ 03/08/2021   Subjective tinnitus 07/22/2019   Thyroid disease    Thyroid nodule 07/22/2019    Exam BP 120/68    Pulse 85    Temp 97.9 F (36.6 C) (Oral)    Ht 5' 2"  (1.575 m)    Wt 153 lb (69.4 kg)    SpO2 99%    BMI 27.98 kg/m  General:  well developed, well nourished, in no apparent distress Heart: RRR, no bruits, no LE edema Lungs: clear to auscultation, no accessory muscle use Psych: well oriented with normal  range of affect and appropriate judgment/insight  Essential hypertension  Other hyperlipidemia - Plan: Lipid panel, Comprehensive metabolic panel  Hypothyroidism, unspecified type - Plan: TSH  Osteoporosis, unspecified osteoporosis type, unspecified pathological fracture presence - Plan: VITAMIN D 25 Hydroxy (Vit-D Deficiency, Fractures)  Aortic atherosclerosis (HCC), Chronic  Chronic, stable. Cont lisinopril 30 mg/d, HCTZ 25 mg/d. Counseled on diet and exercise. Chronic, stable. Cont Crestor 20 mg/d. Ck labs.  Chronic, stable. Cont levothyroxine 112 mcg/d. Chronic, stable. Cont Fosamax 70 mg/d.  Cont Ca and Vit D.  F/u in 6 mo. The patient voiced understanding and agreement to the plan.  [place], DO 04/23/21  10:06 AM

## 2021-04-26 DIAGNOSIS — M6281 Muscle weakness (generalized): Secondary | ICD-10-CM | POA: Diagnosis not present

## 2021-04-26 DIAGNOSIS — M25652 Stiffness of left hip, not elsewhere classified: Secondary | ICD-10-CM | POA: Diagnosis not present

## 2021-04-26 DIAGNOSIS — M25552 Pain in left hip: Secondary | ICD-10-CM | POA: Diagnosis not present

## 2021-04-26 DIAGNOSIS — Z96642 Presence of left artificial hip joint: Secondary | ICD-10-CM | POA: Diagnosis not present

## 2021-04-29 DIAGNOSIS — M25652 Stiffness of left hip, not elsewhere classified: Secondary | ICD-10-CM | POA: Diagnosis not present

## 2021-04-29 DIAGNOSIS — M25552 Pain in left hip: Secondary | ICD-10-CM | POA: Diagnosis not present

## 2021-04-29 DIAGNOSIS — Z96642 Presence of left artificial hip joint: Secondary | ICD-10-CM | POA: Diagnosis not present

## 2021-04-29 DIAGNOSIS — M6281 Muscle weakness (generalized): Secondary | ICD-10-CM | POA: Diagnosis not present

## 2021-05-03 DIAGNOSIS — M25652 Stiffness of left hip, not elsewhere classified: Secondary | ICD-10-CM | POA: Diagnosis not present

## 2021-05-03 DIAGNOSIS — M25552 Pain in left hip: Secondary | ICD-10-CM | POA: Diagnosis not present

## 2021-05-03 DIAGNOSIS — Z96642 Presence of left artificial hip joint: Secondary | ICD-10-CM | POA: Diagnosis not present

## 2021-05-03 DIAGNOSIS — M6281 Muscle weakness (generalized): Secondary | ICD-10-CM | POA: Diagnosis not present

## 2021-05-04 ENCOUNTER — Other Ambulatory Visit (HOSPITAL_BASED_OUTPATIENT_CLINIC_OR_DEPARTMENT_OTHER): Payer: Self-pay | Admitting: Family Medicine

## 2021-05-04 ENCOUNTER — Other Ambulatory Visit: Payer: PPO

## 2021-05-04 DIAGNOSIS — M25562 Pain in left knee: Secondary | ICD-10-CM | POA: Diagnosis not present

## 2021-05-04 DIAGNOSIS — Z1231 Encounter for screening mammogram for malignant neoplasm of breast: Secondary | ICD-10-CM

## 2021-05-05 ENCOUNTER — Other Ambulatory Visit: Payer: Self-pay | Admitting: Family Medicine

## 2021-05-05 ENCOUNTER — Other Ambulatory Visit (INDEPENDENT_AMBULATORY_CARE_PROVIDER_SITE_OTHER): Payer: PPO

## 2021-05-05 DIAGNOSIS — M810 Age-related osteoporosis without current pathological fracture: Secondary | ICD-10-CM

## 2021-05-05 DIAGNOSIS — E7849 Other hyperlipidemia: Secondary | ICD-10-CM

## 2021-05-05 DIAGNOSIS — R5383 Other fatigue: Secondary | ICD-10-CM | POA: Diagnosis not present

## 2021-05-05 DIAGNOSIS — E039 Hypothyroidism, unspecified: Secondary | ICD-10-CM | POA: Diagnosis not present

## 2021-05-05 DIAGNOSIS — D649 Anemia, unspecified: Secondary | ICD-10-CM

## 2021-05-05 LAB — LIPID PANEL
Cholesterol: 106 mg/dL (ref 0–200)
HDL: 56.2 mg/dL (ref >39.00)
LDL Cholesterol: 34 mg/dL (ref 0–99)
NonHDL: 49.62
Total CHOL/HDL Ratio: 2
Triglycerides: 78 mg/dL (ref 0.0–149.0)
VLDL: 15.6 mg/dL (ref 0.0–40.0)

## 2021-05-05 LAB — CBC
HCT: 33.9 % — ABNORMAL LOW (ref 36.0–46.0)
Hemoglobin: 11.3 g/dL — ABNORMAL LOW (ref 12.0–15.0)
MCHC: 33.3 g/dL (ref 30.0–36.0)
MCV: 95.5 fl (ref 78.0–100.0)
Platelets: 297 10*3/uL (ref 150.0–400.0)
RBC: 3.55 Mil/uL — ABNORMAL LOW (ref 3.87–5.11)
RDW: 14.1 % (ref 11.5–15.5)
WBC: 7.4 10*3/uL (ref 4.0–10.5)

## 2021-05-05 LAB — COMPREHENSIVE METABOLIC PANEL
ALT: 13 U/L (ref 0–35)
AST: 19 U/L (ref 0–37)
Albumin: 4.1 g/dL (ref 3.5–5.2)
Alkaline Phosphatase: 56 U/L (ref 39–117)
BUN: 47 mg/dL — ABNORMAL HIGH (ref 6–23)
CO2: 24 mEq/L (ref 19–32)
Calcium: 9.6 mg/dL (ref 8.4–10.5)
Chloride: 103 mEq/L (ref 96–112)
Creatinine, Ser: 1 mg/dL (ref 0.40–1.20)
GFR: 53.51 mL/min — ABNORMAL LOW (ref >60.00)
Glucose, Bld: 109 mg/dL — ABNORMAL HIGH (ref 70–99)
Potassium: 5 mEq/L (ref 3.5–5.1)
Sodium: 136 mEq/L (ref 135–145)
Total Bilirubin: 0.5 mg/dL (ref 0.2–1.2)
Total Protein: 6.7 g/dL (ref 6.0–8.3)

## 2021-05-05 LAB — TSH: TSH: 0.88 u[IU]/mL (ref 0.35–5.50)

## 2021-05-05 LAB — VITAMIN D 25 HYDROXY (VIT D DEFICIENCY, FRACTURES): VITD: 67.28 ng/mL (ref 30.00–100.00)

## 2021-05-06 DIAGNOSIS — M25652 Stiffness of left hip, not elsewhere classified: Secondary | ICD-10-CM | POA: Diagnosis not present

## 2021-05-06 DIAGNOSIS — M25552 Pain in left hip: Secondary | ICD-10-CM | POA: Diagnosis not present

## 2021-05-06 DIAGNOSIS — Z96642 Presence of left artificial hip joint: Secondary | ICD-10-CM | POA: Diagnosis not present

## 2021-05-06 DIAGNOSIS — M6281 Muscle weakness (generalized): Secondary | ICD-10-CM | POA: Diagnosis not present

## 2021-05-10 DIAGNOSIS — M25652 Stiffness of left hip, not elsewhere classified: Secondary | ICD-10-CM | POA: Diagnosis not present

## 2021-05-10 DIAGNOSIS — M25552 Pain in left hip: Secondary | ICD-10-CM | POA: Diagnosis not present

## 2021-05-10 DIAGNOSIS — M6281 Muscle weakness (generalized): Secondary | ICD-10-CM | POA: Diagnosis not present

## 2021-05-10 DIAGNOSIS — Z96642 Presence of left artificial hip joint: Secondary | ICD-10-CM | POA: Diagnosis not present

## 2021-05-13 DIAGNOSIS — Z96642 Presence of left artificial hip joint: Secondary | ICD-10-CM | POA: Diagnosis not present

## 2021-05-13 DIAGNOSIS — M6281 Muscle weakness (generalized): Secondary | ICD-10-CM | POA: Diagnosis not present

## 2021-05-13 DIAGNOSIS — M25652 Stiffness of left hip, not elsewhere classified: Secondary | ICD-10-CM | POA: Diagnosis not present

## 2021-05-13 DIAGNOSIS — M25552 Pain in left hip: Secondary | ICD-10-CM | POA: Diagnosis not present

## 2021-05-14 ENCOUNTER — Other Ambulatory Visit (INDEPENDENT_AMBULATORY_CARE_PROVIDER_SITE_OTHER): Payer: PPO

## 2021-05-14 DIAGNOSIS — D649 Anemia, unspecified: Secondary | ICD-10-CM | POA: Diagnosis not present

## 2021-05-14 DIAGNOSIS — R5383 Other fatigue: Secondary | ICD-10-CM

## 2021-05-14 LAB — URINALYSIS, MICROSCOPIC ONLY: RBC / HPF: NONE SEEN (ref 0–?)

## 2021-05-14 LAB — CBC
HCT: 36.4 % (ref 36.0–46.0)
Hemoglobin: 12.1 g/dL (ref 12.0–15.0)
MCHC: 33.2 g/dL (ref 30.0–36.0)
MCV: 95.3 fl (ref 78.0–100.0)
Platelets: 314 10*3/uL (ref 150.0–400.0)
RBC: 3.82 Mil/uL — ABNORMAL LOW (ref 3.87–5.11)
RDW: 13.9 % (ref 11.5–15.5)
WBC: 8.5 10*3/uL (ref 4.0–10.5)

## 2021-05-14 LAB — IBC + FERRITIN
Ferritin: 92.1 ng/mL (ref 10.0–291.0)
Iron: 84 ug/dL (ref 42–145)
Saturation Ratios: 29.4 % (ref 20.0–50.0)
TIBC: 285.6 ug/dL (ref 250.0–450.0)
Transferrin: 204 mg/dL — ABNORMAL LOW (ref 212.0–360.0)

## 2021-05-17 ENCOUNTER — Ambulatory Visit (HOSPITAL_BASED_OUTPATIENT_CLINIC_OR_DEPARTMENT_OTHER)
Admission: RE | Admit: 2021-05-17 | Discharge: 2021-05-17 | Disposition: A | Discharge status: Home / Self Care | Payer: PPO | Source: Ambulatory Visit | Attending: Family Medicine | Admitting: Family Medicine

## 2021-05-17 ENCOUNTER — Encounter (HOSPITAL_BASED_OUTPATIENT_CLINIC_OR_DEPARTMENT_OTHER): Payer: Self-pay

## 2021-05-17 ENCOUNTER — Other Ambulatory Visit: Payer: Self-pay

## 2021-05-17 DIAGNOSIS — Z1231 Encounter for screening mammogram for malignant neoplasm of breast: Secondary | ICD-10-CM | POA: Insufficient documentation

## 2021-05-19 ENCOUNTER — Other Ambulatory Visit: Payer: Self-pay

## 2021-05-19 ENCOUNTER — Ambulatory Visit (INDEPENDENT_AMBULATORY_CARE_PROVIDER_SITE_OTHER): Payer: PPO | Admitting: Bariatrics

## 2021-05-19 ENCOUNTER — Encounter (INDEPENDENT_AMBULATORY_CARE_PROVIDER_SITE_OTHER): Payer: Self-pay | Admitting: Bariatrics

## 2021-05-19 VITALS — BP 130/78 | HR 80 | Temp 97.4°F | Ht 62.0 in | Wt 146.0 lb

## 2021-05-19 DIAGNOSIS — E038 Other specified hypothyroidism: Secondary | ICD-10-CM | POA: Diagnosis not present

## 2021-05-19 DIAGNOSIS — Z6826 Body mass index (BMI) 26.0-26.9, adult: Secondary | ICD-10-CM | POA: Diagnosis not present

## 2021-05-19 DIAGNOSIS — E669 Obesity, unspecified: Secondary | ICD-10-CM | POA: Diagnosis not present

## 2021-05-19 DIAGNOSIS — I10 Essential (primary) hypertension: Secondary | ICD-10-CM | POA: Diagnosis not present

## 2021-05-19 NOTE — Progress Notes (Signed)
Chief Complaint:   OBESITY Shannon Bond is here to discuss her progress with her obesity treatment plan along with follow-up of her obesity related diagnoses. [name] is on the Category 1 Plan and states she is following her eating plan approximately 100% of the time. [name] states she is cardio and weights for 50 minutes 7 times per week, physical therapy for 60 minutes 2 times per week and walking for 60 minutes 7 times per week.   Today's visit was #: 10 Starting weight: 183 lbs Starting date: 06/24/2019 Today's weight: 146 lbs Today's date: 05/19/2021 Total lbs lost to date: 37 lbs Total lbs lost since last in-office visit: 0  Interim History: [name] has maintained her weight.   Subjective:   1. Essential hypertension [name]'s blood pressure is controlled. Her last blood pressure was 137/77.  2. Other specified hypothyroidism [name] is taking Synthroid currently. She has mild fatigue but improving.  Assessment/Plan:   1. Essential hypertension [name] will continue her medications. She is working on healthy weight loss and exercise to improve blood pressure control. We will watch for signs of hypotension as she continues her lifestyle modifications.  2. Other specified hypothyroidism [name] will continue taking Synthroid. Orders and follow up as documented in patient record.  Counseling Good thyroid control is important for overall health. Supratherapeutic thyroid levels are dangerous and will not improve weight loss results. Counseling: The correct way to take levothyroxine is fasting, with water, separated by at least 30 minutes from breakfast, and separated by more than 4 hours from calcium, iron, multivitamins, acid reflux medications (PPIs).    3. Obesity, current BMI 26.7 [name] is currently in the action stage of change. As such, her goal is to continue with weight loss efforts. She has agreed to the Category 1 Plan.   [name] will be mindful eating. She will continue with  the plan.  Exercise goals:  As is.  Behavioral modification strategies: increasing lean protein intake, decreasing simple carbohydrates, increasing vegetables, increasing water intake, decreasing eating out, no skipping meals, meal planning and cooking strategies, keeping healthy foods in the home, and planning for success.  [name] has agreed to follow-up with our clinic in 4 weeks. She was informed of the importance of frequent follow-up visits to maximize her success with intensive lifestyle modifications for her multiple health conditions.   Objective:   Blood pressure 130/78, pulse 80, temperature (!) 97.4 F (36.3 C), height 5' 2"  (1.575 m), weight 146 lb (66.2 kg), SpO2 99 %. Body mass index is 26.7 kg/m.  General: Cooperative, alert, well developed, in no acute distress. HEENT: Conjunctivae and lids unremarkable. Cardiovascular: Regular rhythm.  Lungs: Normal work of breathing. Neurologic: No focal deficits.   Lab Results  Component Value Date   CREATININE 1.00 05/05/2021   BUN 47 (H) 05/05/2021   NA 136 05/05/2021   K 5.0 05/05/2021   CL 103 05/05/2021   CO2 24 05/05/2021   Lab Results  Component Value Date   ALT 13 05/05/2021   AST 19 05/05/2021   ALKPHOS 56 05/05/2021   BILITOT 0.5 05/05/2021   Lab Results  Component Value Date   HGBA1C 5.4 02/26/2021   HGBA1C 5.1 06/24/2019   Lab Results  Component Value Date   INSULIN 5.6 06/24/2019   Lab Results  Component Value Date   TSH 0.88 05/05/2021   Lab Results  Component Value Date   CHOL 106 05/05/2021   HDL 56.20 05/05/2021   LDLCALC 34 05/05/2021  TRIG 78.0 05/05/2021   CHOLHDL 2 05/05/2021   Lab Results  Component Value Date   VD25OH 67.28 05/05/2021   VD25OH 75.6 06/22/2020   VD25OH 89.6 03/16/2020   Lab Results  Component Value Date   WBC 8.5 05/14/2021   HGB 12.1 05/14/2021   HCT 36.4 05/14/2021   MCV 95.3 05/14/2021   PLT 314.0 05/14/2021   Lab Results  Component Value Date    IRON 84 05/14/2021   TIBC 285.6 05/14/2021   FERRITIN 92.1 05/14/2021   Attestation Statements:   Reviewed by clinician on day of visit: allergies, medications, problem list, medical history, surgical history, family history, social history, and previous encounter notes.  I, Lizbeth Bark, RMA, am acting as Location manager for [employer], DO.  I have reviewed the above documentation for accuracy and completeness, and I agree with the above. Jearld Lesch, DO

## 2021-05-20 DIAGNOSIS — M25652 Stiffness of left hip, not elsewhere classified: Secondary | ICD-10-CM | POA: Diagnosis not present

## 2021-05-20 DIAGNOSIS — M25552 Pain in left hip: Secondary | ICD-10-CM | POA: Diagnosis not present

## 2021-05-20 DIAGNOSIS — Z96642 Presence of left artificial hip joint: Secondary | ICD-10-CM | POA: Diagnosis not present

## 2021-05-20 DIAGNOSIS — M6281 Muscle weakness (generalized): Secondary | ICD-10-CM | POA: Diagnosis not present

## 2021-06-16 ENCOUNTER — Ambulatory Visit (INDEPENDENT_AMBULATORY_CARE_PROVIDER_SITE_OTHER): Payer: PPO | Admitting: Bariatrics

## 2021-06-16 ENCOUNTER — Other Ambulatory Visit: Payer: Self-pay

## 2021-06-16 ENCOUNTER — Encounter (INDEPENDENT_AMBULATORY_CARE_PROVIDER_SITE_OTHER): Payer: Self-pay | Admitting: Bariatrics

## 2021-06-16 ENCOUNTER — Telehealth: Payer: Self-pay | Admitting: Family Medicine

## 2021-06-16 VITALS — BP 136/74 | HR 77 | Temp 97.4°F | Ht 62.0 in | Wt 146.0 lb

## 2021-06-16 DIAGNOSIS — E7849 Other hyperlipidemia: Secondary | ICD-10-CM | POA: Diagnosis not present

## 2021-06-16 DIAGNOSIS — E669 Obesity, unspecified: Secondary | ICD-10-CM

## 2021-06-16 DIAGNOSIS — I10 Essential (primary) hypertension: Secondary | ICD-10-CM | POA: Diagnosis not present

## 2021-06-16 DIAGNOSIS — Z6826 Body mass index (BMI) 26.0-26.9, adult: Secondary | ICD-10-CM

## 2021-06-16 NOTE — Progress Notes (Signed)
? ? ? ?Chief Complaint:  ? ?OBESITY ?Shannon Bond is here to discuss her progress with her obesity treatment plan along with follow-up of her obesity related diagnoses. Shannon Bond is on the Category 1 Plan and states she is following her eating plan approximately 1% of the time. Story states she is currently weights and engaging in cardio for 60 minutes 7 times per week. She will begin balance exercises ( discussed and modeled these techniques ).  ? ?Today's visit was #: 61 ?Starting weight: 183 lbs ?Starting date: 06/24/2019 ?Today's weight: 146 lbs ?Today's date: 06/16/2021 ?Total lbs lost to date: 37 lbs ?Total lbs lost since last in-office visit: 0 ? ?Interim History: Juleah's weight is stable.  ? ?Subjective:  ? ?1. Other hyperlipidemia ?Shannon Bond is currently taking Crestor. ? ?2. Essential hypertension ?Shannon Bond's blood pressure is controlled. Her last blood pressure was 130/78. ? ?Assessment/Plan:  ? ?1. Other hyperlipidemia ?Cardiovascular risk and specific lipid/LDL goals reviewed.  Shannon Bond will continue taking Crestor. She will continue exercising. We discussed several lifestyle modifications today and Shannon Bond will continue to work on diet, exercise and weight loss efforts. Orders and follow up as documented in patient record.  ? ?Counseling ?Intensive lifestyle modifications are the first line treatment for this issue. ?Dietary changes: Increase soluble fiber. Decrease simple carbohydrates. ?Exercise changes: Moderate to vigorous-intensity aerobic activity 150 minutes per week if tolerated. ?Lipid-lowering medications: see documented in medical record. ? ?2. Essential hypertension ?Shannon Bond will have zero added salt. She will continue taking her medication. She will continue with the plan. She is working on healthy weight loss and exercise to improve blood pressure control. We will watch for signs of hypotension as she continues her lifestyle modifications. ? ?3. Obesity, current BMI 26.7 ?Shannon Bond is currently in the action  stage of change. As such, her goal is to continue with weight loss efforts. She has agreed to the Category 1 Plan.  ? ?Shannon Bond will continue meal planning and she will continue intentional eating.  ? ?Exercise goals:  Shannon Bond will begin balance exercise.  ? ?Behavioral modification strategies: increasing lean protein intake, decreasing simple carbohydrates, increasing vegetables, increasing water intake, decreasing eating out, no skipping meals, meal planning and cooking strategies, keeping healthy foods in the home, and planning for success. ? ?Shannon Bond has agreed to follow-up with our clinic in 4 weeks. She was informed of the importance of frequent follow-up visits to maximize her success with intensive lifestyle modifications for her multiple health conditions.  ? ?Objective:  ? ?Blood pressure 136/74, pulse 77, temperature (!) 97.4 ?F (36.3 ?C), height 5' 2"  (1.575 m), weight 146 lb (66.2 kg), SpO2 99 %. ?Body mass index is 26.7 kg/m?. ? ?General: Cooperative, alert, well developed, in no acute distress. ?HEENT: Conjunctivae and lids unremarkable. ?Cardiovascular: Regular rhythm.  ?Lungs: Normal work of breathing. ?Neurologic: No focal deficits.  ? ?Lab Results  ?Component Value Date  ? CREATININE 1.00 05/05/2021  ? BUN 47 (H) 05/05/2021  ? NA 136 05/05/2021  ? K 5.0 05/05/2021  ? CL 103 05/05/2021  ? CO2 24 05/05/2021  ? ?Lab Results  ?Component Value Date  ? ALT 13 05/05/2021  ? AST 19 05/05/2021  ? ALKPHOS 56 05/05/2021  ? BILITOT 0.5 05/05/2021  ? ?Lab Results  ?Component Value Date  ? HGBA1C 5.4 02/26/2021  ? HGBA1C 5.1 06/24/2019  ? ?Lab Results  ?Component Value Date  ? INSULIN 5.6 06/24/2019  ? ?Lab Results  ?Component Value Date  ? TSH 0.88 05/05/2021  ? ?Lab Results  ?  Component Value Date  ? CHOL 106 05/05/2021  ? HDL 56.20 05/05/2021  ? [place] 34 05/05/2021  ? TRIG 78.0 05/05/2021  ? CHOLHDL 2 05/05/2021  ? ?Lab Results  ?Component Value Date  ? VD25OH 67.28 05/05/2021  ? VD25OH 75.6 06/22/2020  ?  VD25OH 89.6 03/16/2020  ? ?Lab Results  ?Component Value Date  ? WBC 8.5 05/14/2021  ? HGB 12.1 05/14/2021  ? HCT 36.4 05/14/2021  ? MCV 95.3 05/14/2021  ? PLT 314.0 05/14/2021  ? ?Lab Results  ?Component Value Date  ? IRON 84 05/14/2021  ? TIBC 285.6 05/14/2021  ? FERRITIN 92.1 05/14/2021  ? ?Attestation Statements:  ? ?Reviewed by clinician on day of visit: allergies, medications, problem list, medical history, surgical history, family history, social history, and previous encounter notes. ? ?I, Lizbeth Bark, RMA, am acting as transcriptionist for [employer], DO. ? ?I have reviewed the above documentation for accuracy and completeness, and I agree with the above. Jearld Lesch, DO ? ?

## 2021-06-16 NOTE — Telephone Encounter (Signed)
Left message for patient to call back and schedule Medicare Annual Wellness Visit (AWV) in office.  ° °If not able to come in office, please offer to do virtually or by telephone.  Left office number and my jabber #[phone]. ° °Due for AWVI ° °Please schedule at anytime with Nurse Health Advisor. °  °

## 2021-06-19 ENCOUNTER — Encounter (INDEPENDENT_AMBULATORY_CARE_PROVIDER_SITE_OTHER): Payer: Self-pay | Admitting: Bariatrics

## 2021-06-26 ENCOUNTER — Other Ambulatory Visit: Payer: Self-pay | Admitting: Family Medicine

## 2021-06-26 DIAGNOSIS — I10 Essential (primary) hypertension: Secondary | ICD-10-CM

## 2021-07-13 ENCOUNTER — Other Ambulatory Visit: Payer: Self-pay | Admitting: Family Medicine

## 2021-07-14 ENCOUNTER — Encounter (INDEPENDENT_AMBULATORY_CARE_PROVIDER_SITE_OTHER): Payer: Self-pay | Admitting: Bariatrics

## 2021-07-14 ENCOUNTER — Ambulatory Visit (INDEPENDENT_AMBULATORY_CARE_PROVIDER_SITE_OTHER): Payer: PPO | Admitting: Bariatrics

## 2021-07-14 ENCOUNTER — Other Ambulatory Visit: Payer: Self-pay

## 2021-07-14 VITALS — BP 136/78 | HR 76 | Temp 97.6°F | Ht 62.0 in | Wt 147.0 lb

## 2021-07-14 DIAGNOSIS — Z6827 Body mass index (BMI) 27.0-27.9, adult: Secondary | ICD-10-CM | POA: Diagnosis not present

## 2021-07-14 DIAGNOSIS — E669 Obesity, unspecified: Secondary | ICD-10-CM

## 2021-07-14 DIAGNOSIS — I10 Essential (primary) hypertension: Secondary | ICD-10-CM | POA: Diagnosis not present

## 2021-07-14 DIAGNOSIS — E66811 Obesity, class 1: Secondary | ICD-10-CM

## 2021-07-14 DIAGNOSIS — E038 Other specified hypothyroidism: Secondary | ICD-10-CM | POA: Diagnosis not present

## 2021-07-14 NOTE — Progress Notes (Signed)
? ? ? ?Chief Complaint:  ? ?OBESITY ?Shannon Bond is here to discuss her progress with her obesity treatment plan along with follow-up of her obesity related diagnoses. Shannon Bond is on the Category 1 Plan and states she is following her eating plan approximately 100% of the time. Shannon Bond states she is doing cardio and strength 60 minutes 7 times per week. ? ?Today's visit was #: 64 ?Starting weight: 183 lbs ?Starting date: 06/24/2019 ?Today's weight: 147 lbs ?Today's date: 07/14/2021 ?Total lbs lost to date: 69 ?Total lbs lost since last in-office visit: 0 ? ?Interim History: Shannon Bond is up 1 lb since her last visit. The bioimpedance scale shows that she is up 1 lb of muscle since her last visit. ? ?Subjective:  ? ?1. Essential hypertension ?Shannon Bond is taking HCTZ, and Zestril currently.  ? ?2. Other specified hypothyroidism ?Shannon Bond is currently taking Synthroid. ? ?Assessment/Plan:  ? ?1. Essential hypertension ?Shannon Bond is working on healthy weight loss and exercise to improve blood pressure control. We will watch for signs of hypotension as she continues her lifestyle modifications. Shannon Bond agrees to continue with taking her current medication and exercising and activities. ? ?2. Other specified hypothyroidism ?Shannon Bond agrees to continue taking her current medication. ? ?3. Obesity, current BMI 27 ?Shannon Bond is currently in the action stage of change. As such, her goal is to continue with weight loss efforts. She has agreed to the Category 1 Plan.  ? ?Shannon Bond will continue with meal planning, intentional eating, and keep protein and water high. ? ?Exercise goals: Continue with cardio and strength exercises 60 minutes, 7 days a week. Shannon Bond has increased her balance exercises to 20 seconds, and will continue. ? ?Behavioral modification strategies: increasing lean protein intake, decreasing simple carbohydrates, increasing vegetables, increasing water intake, decreasing eating out, no skipping meals, meal planning and cooking strategies,  keeping healthy foods in the home, and planning for success. ? ?Shannon Bond has agreed to follow-up with our clinic in 5 weeks. She was informed of the importance of frequent follow-up visits to maximize her success with intensive lifestyle modifications for her multiple health conditions.  ? ?Objective:  ? ?Blood pressure 136/78, pulse 76, temperature 97.6 ?F (36.4 ?C), height 5' 2"  (1.575 m), weight 147 lb (66.7 kg), SpO2 99 %. ?Body mass index is 26.89 kg/m?. ? ?General: Cooperative, alert, well developed, in no acute distress. ?HEENT: Conjunctivae and lids unremarkable. ?Cardiovascular: Regular rhythm.  ?Lungs: Normal work of breathing. ?Neurologic: No focal deficits.  ? ?Lab Results  ?Component Value Date  ? CREATININE 1.00 05/05/2021  ? BUN 47 (H) 05/05/2021  ? NA 136 05/05/2021  ? K 5.0 05/05/2021  ? CL 103 05/05/2021  ? CO2 24 05/05/2021  ? ?Lab Results  ?Component Value Date  ? ALT 13 05/05/2021  ? AST 19 05/05/2021  ? ALKPHOS 56 05/05/2021  ? BILITOT 0.5 05/05/2021  ? ?Lab Results  ?Component Value Date  ? HGBA1C 5.4 02/26/2021  ? HGBA1C 5.1 06/24/2019  ? ?Lab Results  ?Component Value Date  ? INSULIN 5.6 06/24/2019  ? ?Lab Results  ?Component Value Date  ? TSH 0.88 05/05/2021  ? ?Lab Results  ?Component Value Date  ? CHOL 106 05/05/2021  ? HDL 56.20 05/05/2021  ? [place] 34 05/05/2021  ? TRIG 78.0 05/05/2021  ? CHOLHDL 2 05/05/2021  ? ?Lab Results  ?Component Value Date  ? VD25OH 67.28 05/05/2021  ? VD25OH 75.6 06/22/2020  ? VD25OH 89.6 03/16/2020  ? ?Lab Results  ?Component Value Date  ? WBC 8.5  05/14/2021  ? HGB 12.1 05/14/2021  ? HCT 36.4 05/14/2021  ? MCV 95.3 05/14/2021  ? PLT 314.0 05/14/2021  ? ?Lab Results  ?Component Value Date  ? IRON 84 05/14/2021  ? TIBC 285.6 05/14/2021  ? FERRITIN 92.1 05/14/2021  ? ? ?Obesity Behavioral Intervention:  ? ?Approximately 15 minutes were spent on the discussion below. ? ?ASK: ?We discussed the diagnosis of obesity with Shannon Bond today and Shannon Bond agreed to give Shannon Bond  permission to discuss obesity behavioral modification therapy today. ? ?ASSESS: ?Shannon Bond has the diagnosis of obesity and her BMI today is 27. Shannon Bond is in the action stage of change.  ? ?ADVISE: ?Shannon Bond was educated on the multiple health risks of obesity as well as the benefit of weight loss to improve her health. She was advised of the need for long term treatment and the importance of lifestyle modifications to improve her current health and to decrease her risk of future health problems. ? ?AGREE: ?Multiple dietary modification options and treatment options were discussed and Shannon Bond agreed to follow the recommendations documented in the above note. ? ?ARRANGE: ?Shannon Bond was educated on the importance of frequent visits to treat obesity as outlined per CMS and USPSTF guidelines and agreed to schedule her next follow up appointment today. ? ?Attestation Statements:  ? ?Reviewed by clinician on day of visit: allergies, medications, problem list, medical history, surgical history, family history, social history, and previous encounter notes. ? ?I, Lennette Bihari, am acting as Location manager for [employer], DO. ? ?I have reviewed the above documentation for accuracy and completeness, and I agree with the above. Jearld Lesch, DO ? ?

## 2021-07-15 ENCOUNTER — Encounter (INDEPENDENT_AMBULATORY_CARE_PROVIDER_SITE_OTHER): Payer: Self-pay | Admitting: Bariatrics

## 2021-07-17 ENCOUNTER — Other Ambulatory Visit: Payer: Self-pay | Admitting: Family Medicine

## 2021-08-11 ENCOUNTER — Encounter: Payer: Self-pay | Admitting: Family Medicine

## 2021-08-11 ENCOUNTER — Ambulatory Visit (INDEPENDENT_AMBULATORY_CARE_PROVIDER_SITE_OTHER): Payer: PPO | Admitting: Family Medicine

## 2021-08-11 VITALS — BP 130/76 | HR 89 | Temp 97.5°F | Ht 62.0 in | Wt 150.0 lb

## 2021-08-11 DIAGNOSIS — N3001 Acute cystitis with hematuria: Secondary | ICD-10-CM | POA: Diagnosis not present

## 2021-08-11 LAB — POC URINALSYSI DIPSTICK (AUTOMATED)
Bilirubin, UA: NEGATIVE
Glucose, UA: NEGATIVE
Ketones, UA: NEGATIVE
Nitrite, UA: POSITIVE
Protein, UA: POSITIVE — AB
Spec Grav, UA: 1.015 (ref 1.010–1.025)
Urobilinogen, UA: 0.2 E.U./dL
pH, UA: 5 (ref 5.0–8.0)

## 2021-08-11 MED ORDER — CEPHALEXIN 500 MG PO CAPS: 500.0000 mg | ORAL_CAPSULE | Freq: Three times a day (TID) | ORAL | 0 refills | Status: DC

## 2021-08-11 NOTE — Progress Notes (Signed)
Chief Complaint  ?Patient presents with  ? Dysuria  ? Urinary Frequency  ? Dizziness  ? ? ?Shannon Bond is a 81 y.o. female here for possible UTI. ? ?Duration: 3 days. ?Symptoms: Dysuria, urinary frequency, urinary retention, lower abd pressure, and urgency ?Denies: hematuria, urinary hesitancy, fever, nausea, vomiting, flank pain, vaginal discharge ?Hx of recurrent UTI? No ?Denies new sexual partners. ? ?Past Medical History:  ?Diagnosis Date  ? Alcohol abuse   ? Allergy   ? Alopecia   ? Arthritis   ? Cardiac murmur 07/02/2019  ? Celiac disease   ? Chronic laryngitis 07/22/2019  ? Chronic rhinitis 07/22/2019  ? Diverticulitis   ? DOE (dyspnea on exertion) 07/02/2019  ? Dysphagia 07/22/2019  ? Edema of both lower extremities   ? Essential hypertension 01/24/2018  ? GERD (gastroesophageal reflux disease)   ? History of malignant neoplasm of skin 03/08/2021  ? Hypertension   ? Hyperthyroidism   ? Hypothyroidism 01/24/2018  ? IBS (irritable bowel syndrome)   ? Laryngopharyngeal reflux 07/22/2019  ? Moderate aortic regurgitation 10/11/2019  ? Obesity (BMI 30-39.9) 04/01/2019  ? Osteoarthritis   ? Osteoporosis 04/01/2019  ? Seborrheic dermatitis of scalp 03/08/2021  ? Sensorineural hearing loss 07/22/2019  ? Squamous cell carcinoma in situ 03/08/2021  ? Subjective tinnitus 07/22/2019  ? Thyroid disease   ? Thyroid nodule 07/22/2019  ?  ? ?BP 130/76   Pulse 89   Temp (!) 97.5 ?F (36.4 ?C) (Oral)   Ht 5' 2"  (1.575 m)   Wt 150 lb (68 kg)   SpO2 97%   BMI 27.44 kg/m?  ?General: Awake, alert, appears stated age ?Heart: RRR ?Lungs: CTAB, normal respiratory effort, no accessory muscle usage ?Abd: BS+, soft, mild TTP in the suprapubic region, ND, no masses or organomegaly ?MSK: No CVA tenderness, neg Lloyd's sign ?Psych: Age appropriate judgment and insight ? ?Acute cystitis with hematuria - Plan: POCT Urinalysis Dipstick (Automated), Urine Culture, cephALEXin (KEFLEX) 500 MG capsule ? ?7 days of Keflex which she has done well on in the  past. ?Stay hydrated. ?Seek immediate care if pt starts to develop fevers, new/worsening symptoms, uncontrollable N/V. ?F/u prn. ?The patient voiced understanding and agreement to the plan. ? ?Shelda Pal, DO ?08/11/21 ?11:57 AM ? ?

## 2021-08-11 NOTE — Patient Instructions (Signed)
Stay hydrated.   Warning signs/symptoms: Uncontrollable nausea/vomiting, fevers, worsening symptoms despite treatment, confusion.  Give us around 2 business days to get culture back to you.  Let us know if you need anything. 

## 2021-08-13 ENCOUNTER — Encounter: Payer: Self-pay | Admitting: Family Medicine

## 2021-08-13 ENCOUNTER — Other Ambulatory Visit: Payer: Self-pay | Admitting: Family Medicine

## 2021-08-13 LAB — URINE CULTURE
MICRO NUMBER:: 13315020
SPECIMEN QUALITY:: ADEQUATE

## 2021-08-13 MED ORDER — CEFDINIR 300 MG PO CAPS: 300.0000 mg | ORAL_CAPSULE | Freq: Two times a day (BID) | ORAL | 0 refills | Status: AC

## 2021-08-18 ENCOUNTER — Other Ambulatory Visit: Payer: Self-pay | Admitting: Family Medicine

## 2021-08-23 ENCOUNTER — Encounter (INDEPENDENT_AMBULATORY_CARE_PROVIDER_SITE_OTHER): Payer: Self-pay | Admitting: Bariatrics

## 2021-08-23 ENCOUNTER — Ambulatory Visit (INDEPENDENT_AMBULATORY_CARE_PROVIDER_SITE_OTHER): Payer: PPO | Admitting: Bariatrics

## 2021-08-23 VITALS — BP 119/70 | Ht 62.0 in | Wt 148.0 lb

## 2021-08-23 DIAGNOSIS — I10 Essential (primary) hypertension: Secondary | ICD-10-CM | POA: Diagnosis not present

## 2021-08-23 DIAGNOSIS — E669 Obesity, unspecified: Secondary | ICD-10-CM | POA: Diagnosis not present

## 2021-08-23 DIAGNOSIS — Z6827 Body mass index (BMI) 27.0-27.9, adult: Secondary | ICD-10-CM | POA: Diagnosis not present

## 2021-08-23 DIAGNOSIS — I700 Atherosclerosis of aorta: Secondary | ICD-10-CM

## 2021-08-29 NOTE — Progress Notes (Signed)
Chief Complaint:   OBESITY Shannon Bond is here to discuss her progress with her obesity treatment plan along with follow-up of her obesity related diagnoses. Shannon Bond is on the Category 1 Plan and states she is following her eating plan approximately 95% of the time. Shannon Bond states she is doing cardio and walking for 60 minutes 7 times per week.  Today's visit was #: 76 Starting weight: 183 lbs Starting date: 06/24/2019 Today's weight: 148 lbs Today's date: 08/23/2021 Total lbs lost to date: 35 lbs Total lbs lost since last in-office visit: 0  Interim History: Shannon Bond is up 1 lb and is in the maintain phase. She was on vacation and ate out more.   Subjective:   1. Primary hypertension Shannon Bond's blood pressure is controlled. Her blood pressure today was 119/70.  2. Aortic atherosclerosis ([place]) Shannon Bond is currently taking Crestor, and medications for hypertension.  Assessment/Plan:   1. Primary hypertension Shannon Bond will continue her medications. She is working on healthy weight loss and exercise to improve blood pressure control. We will watch for signs of hypotension as she continues her lifestyle modifications.  2. Aortic atherosclerosis ([place]) Shannon Bond will continue medications. She will continue the plan and exercise.   3. Obesity, Current BMI 27.2 Shannon Bond is currently in the action stage of change. As such, her goal is to continue with weight loss efforts. She has agreed to the Category 1 Plan.   Shannon Bond will continue meal planning and she will continue to adhere closely to the plan.  Exercise goals:  Shannon Bond has been walking more and has been active.   Behavioral modification strategies: increasing lean protein intake, decreasing simple carbohydrates, increasing vegetables, increasing water intake, decreasing eating out, no skipping meals, meal planning and cooking strategies, keeping healthy foods in the home, and planning for success.  Shannon Bond has agreed to follow-up with our clinic in  4-5 weeks. She was informed of the importance of frequent follow-up visits to maximize her success with intensive lifestyle modifications for her multiple health conditions.   Objective:   Blood pressure 119/70, height 5' 2"  (1.575 m), weight 148 lb (67.1 kg). Body mass index is 27.07 kg/m.  General: Cooperative, alert, well developed, in no acute distress. HEENT: Conjunctivae and lids unremarkable. Cardiovascular: Regular rhythm.  Lungs: Normal work of breathing. Neurologic: No focal deficits.   Lab Results  Component Value Date   CREATININE 1.00 05/05/2021   BUN 47 (H) 05/05/2021   NA 136 05/05/2021   K 5.0 05/05/2021   CL 103 05/05/2021   CO2 24 05/05/2021   Lab Results  Component Value Date   ALT 13 05/05/2021   AST 19 05/05/2021   ALKPHOS 56 05/05/2021   BILITOT 0.5 05/05/2021   Lab Results  Component Value Date   HGBA1C 5.4 02/26/2021   HGBA1C 5.1 06/24/2019   Lab Results  Component Value Date   INSULIN 5.6 06/24/2019   Lab Results  Component Value Date   TSH 0.88 05/05/2021   Lab Results  Component Value Date   CHOL 106 05/05/2021   HDL 56.20 05/05/2021   LDLCALC 34 05/05/2021   TRIG 78.0 05/05/2021   CHOLHDL 2 05/05/2021   Lab Results  Component Value Date   VD25OH 67.28 05/05/2021   VD25OH 75.6 06/22/2020   VD25OH 89.6 03/16/2020   Lab Results  Component Value Date   WBC 8.5 05/14/2021   HGB 12.1 05/14/2021   HCT 36.4 05/14/2021   MCV 95.3 05/14/2021   PLT 314.0 05/14/2021  Lab Results  Component Value Date   IRON 84 05/14/2021   TIBC 285.6 05/14/2021   FERRITIN 92.1 05/14/2021    Attestation Statements:   Reviewed by clinician on day of visit: allergies, medications, problem list, medical history, surgical history, family history, social history, and previous encounter notes.  I, Lizbeth Bark, RMA, am acting as Location manager for [employer], DO.  I have reviewed the above documentation for accuracy and completeness, and I  agree with the above. Jearld Lesch, DO

## 2021-09-05 ENCOUNTER — Other Ambulatory Visit: Payer: Self-pay | Admitting: Family Medicine

## 2021-09-09 ENCOUNTER — Encounter (INDEPENDENT_AMBULATORY_CARE_PROVIDER_SITE_OTHER): Payer: Self-pay | Admitting: Bariatrics

## 2021-09-28 ENCOUNTER — Ambulatory Visit (INDEPENDENT_AMBULATORY_CARE_PROVIDER_SITE_OTHER): Payer: PPO | Admitting: Bariatrics

## 2021-09-28 ENCOUNTER — Encounter (INDEPENDENT_AMBULATORY_CARE_PROVIDER_SITE_OTHER): Payer: Self-pay | Admitting: Bariatrics

## 2021-09-28 VITALS — BP 111/69 | HR 77 | Temp 97.6°F | Ht 62.0 in | Wt 146.0 lb

## 2021-09-28 DIAGNOSIS — K219 Gastro-esophageal reflux disease without esophagitis: Secondary | ICD-10-CM

## 2021-09-28 DIAGNOSIS — I10 Essential (primary) hypertension: Secondary | ICD-10-CM | POA: Diagnosis not present

## 2021-09-28 DIAGNOSIS — Z6826 Body mass index (BMI) 26.0-26.9, adult: Secondary | ICD-10-CM | POA: Diagnosis not present

## 2021-09-28 DIAGNOSIS — E669 Obesity, unspecified: Secondary | ICD-10-CM | POA: Diagnosis not present

## 2021-09-29 NOTE — Progress Notes (Signed)
Chief Complaint:   OBESITY Shannon Bond is here to discuss her progress with her obesity treatment plan along with follow-up of her obesity related diagnoses. Shannon Bond is on the Category 1 Plan and states she is following her eating plan approximately 100% of the time. Shannon Bond states she is doing cardio, walking, and resistant bands for 60 minutes 7 times per week.  Today's visit was #: 63 Starting weight: 183 lbs Starting date: 06/24/2019 Today's weight: 146 lbs Today's date: 09/28/2021 Total lbs lost to date: 37 lbs Total lbs lost since last in-office visit: 2 lbs  Interim History: Shannon Bond has cut back on salt due to water retention.   Subjective:   1. Primary hypertension Shannon Bond's blood pressure is controlled. Her blood pressure today is 111/69.  2. Gastroesophageal reflux disease without esophagitis Shannon Bond has been on Prevacid previously. She denies reflux. She notes occasionally having some spasms in the esophagus.   Assessment/Plan:   1. Primary hypertension Shannon Bond will continue her medications. She is working on healthy weight loss and exercise to improve blood pressure control. We will watch for signs of hypotension as she continues her lifestyle modifications.  2. Gastroesophageal reflux disease without esophagitis Intensive lifestyle modifications are the first line treatment for this issue. Shannon Bond will continue to watch her triggers. We discussed several lifestyle modifications today and she will continue to work on diet, exercise and weight loss efforts. Orders and follow up as documented in patient record.   Counseling If a person has gastroesophageal reflux disease (GERD), food and stomach acid move back up into the esophagus and cause symptoms or problems such as damage to the esophagus. Anti-reflux measures include: raising the head of the bed, avoiding tight clothing or belts, avoiding eating late at night, not lying down shortly after mealtime, and achieving weight  loss. Avoid ASA, NSAID's, caffeine, alcohol, and tobacco.  OTC Pepcid and/or Tums are often very helpful for as needed use.  However, for persisting chronic or daily symptoms, stronger medications like Omeprazole may be needed. You may need to avoid foods and drinks such as: Coffee and tea (with or without caffeine). Drinks that contain alcohol. Energy drinks and sports drinks. Bubbly (carbonated) drinks or sodas. Chocolate and cocoa. Peppermint and mint flavorings. Garlic and onions. Horseradish. Spicy and acidic foods. These include peppers, chili powder, curry powder, vinegar, hot sauces, and BBQ sauce. Citrus fruit juices and citrus fruits, such as oranges, lemons, and limes. Tomato-based foods. These include red sauce, chili, salsa, and pizza with red sauce. Fried and fatty foods. These include donuts, french fries, potato chips, and high-fat dressings. High-fat meats. These include hot dogs, rib eye steak, sausage, ham, and bacon.   3. Obesity, Current BMI 26.7 Shannon Bond is currently in the action stage of change. As such, her goal is to continue with weight loss efforts. She has agreed to the Category 1 Plan.   Shannon Bond will continue meal planning and she will continue intentional eating. She will drink Powerade Zero and Gatorade Zero. She will add protein powder with yogurt.   Exercise goals:  As is.   Behavioral modification strategies: increasing lean protein intake, decreasing simple carbohydrates, increasing vegetables, increasing water intake, decreasing eating out, no skipping meals, meal planning and cooking strategies, keeping healthy foods in the home, and planning for success.  Shannon Bond has agreed to follow-up with our clinic in 4 weeks. She was informed of the importance of frequent follow-up visits to maximize her success with intensive lifestyle modifications for her multiple  health conditions.   Objective:   Blood pressure 111/69, pulse 77, temperature 97.6 F (36.4  C), height 5' 2"  (1.575 m), weight 146 lb (66.2 kg), SpO2 98 %. Body mass index is 26.7 kg/m.  General: Cooperative, alert, well developed, in no acute distress. HEENT: Conjunctivae and lids unremarkable. Cardiovascular: Regular rhythm.  Lungs: Normal work of breathing. Neurologic: No focal deficits.   Lab Results  Component Value Date   CREATININE 1.00 05/05/2021   BUN 47 (H) 05/05/2021   NA 136 05/05/2021   K 5.0 05/05/2021   CL 103 05/05/2021   CO2 24 05/05/2021   Lab Results  Component Value Date   ALT 13 05/05/2021   AST 19 05/05/2021   ALKPHOS 56 05/05/2021   BILITOT 0.5 05/05/2021   Lab Results  Component Value Date   HGBA1C 5.4 02/26/2021   HGBA1C 5.1 06/24/2019   Lab Results  Component Value Date   INSULIN 5.6 06/24/2019   Lab Results  Component Value Date   TSH 0.88 05/05/2021   Lab Results  Component Value Date   CHOL 106 05/05/2021   HDL 56.20 05/05/2021   LDLCALC 34 05/05/2021   TRIG 78.0 05/05/2021   CHOLHDL 2 05/05/2021   Lab Results  Component Value Date   VD25OH 67.28 05/05/2021   VD25OH 75.6 06/22/2020   VD25OH 89.6 03/16/2020   Lab Results  Component Value Date   WBC 8.5 05/14/2021   HGB 12.1 05/14/2021   HCT 36.4 05/14/2021   MCV 95.3 05/14/2021   PLT 314.0 05/14/2021   Lab Results  Component Value Date   IRON 84 05/14/2021   TIBC 285.6 05/14/2021   FERRITIN 92.1 05/14/2021   Attestation Statements:   Reviewed by clinician on day of visit: allergies, medications, problem list, medical history, surgical history, family history, social history, and previous encounter notes.  I, Lizbeth Bark, RMA, am acting as Location manager for [employer], DO.  I have reviewed the above documentation for accuracy and completeness, and I agree with the above. Jearld Lesch, DO

## 2021-09-30 DIAGNOSIS — M25552 Pain in left hip: Secondary | ICD-10-CM | POA: Diagnosis not present

## 2021-10-04 ENCOUNTER — Encounter (INDEPENDENT_AMBULATORY_CARE_PROVIDER_SITE_OTHER): Payer: Self-pay | Admitting: Bariatrics

## 2021-10-07 ENCOUNTER — Encounter: Payer: Self-pay | Admitting: Cardiology

## 2021-10-07 ENCOUNTER — Ambulatory Visit: Payer: PPO | Admitting: Cardiology

## 2021-10-07 VITALS — BP 126/66 | HR 76 | Ht 62.0 in | Wt 153.1 lb

## 2021-10-07 DIAGNOSIS — I10 Essential (primary) hypertension: Secondary | ICD-10-CM | POA: Diagnosis not present

## 2021-10-07 DIAGNOSIS — I351 Nonrheumatic aortic (valve) insufficiency: Secondary | ICD-10-CM

## 2021-10-07 DIAGNOSIS — E782 Mixed hyperlipidemia: Secondary | ICD-10-CM

## 2021-10-07 DIAGNOSIS — I700 Atherosclerosis of aorta: Secondary | ICD-10-CM | POA: Diagnosis not present

## 2021-10-07 HISTORY — DX: Mixed hyperlipidemia: E78.2

## 2021-10-07 NOTE — Progress Notes (Signed)
Cardiology Office Note:    Date:  10/07/2021   ID:  Shannon Bond, DOB 19-Nov-1940, MRN [id number]  PCP:  Shannon Pal, DO  Cardiologist:  Shannon Lindau, MD   Referring MD: Shannon Bond*    ASSESSMENT:    1. Aortic atherosclerosis ([place])   2. Essential hypertension   3. Moderate aortic regurgitation   4. Mixed dyslipidemia    PLAN:    In order of problems listed above:  Primary prevention stressed with the patient.  Importance of compliance with diet and medication stressed and she vocalized understanding.  She has excellent exercise protocol and I congratulated her about this. Moderate aortic regurgitation: Stable at this time and we will follow-up with annual echocardiogram.  She is asymptomatic. Essential hypertension: Stable and diet was emphasized. Mixed dyslipidemia: On lipid-lowering therapy and followed by primary care.  Lipids were reviewed.  Her goal LDL must be less than 70 in view of aortic atherosclerosis. Aortic atherosclerosis: As mentioned above. Patient will be seen in follow-up appointment in 6 months or earlier if the patient has any concerns    Medication Adjustments/Labs and Tests Ordered: Current medicines are reviewed at length with the patient today.  Concerns regarding medicines are outlined above.  No orders of the defined types were placed in this encounter.  No orders of the defined types were placed in this encounter.    No chief complaint on file.    History of Present Illness:    Shannon Bond is a 81 y.o. female.  Patient has past medical history of essential hypertension, dyslipidemia and aortic regurgitation.  She has aortic atherosclerosis.  She denies any problems at this time and takes care of activities of daily living.  No chest pain orthopnea or PND.  At the time of my evaluation, the patient is alert awake oriented and in no distress.  She exercises on a regular basis and walks about 2 miles a day without any  problems.  Past Medical History:  Diagnosis Date   Alcohol abuse    Allergy    Alopecia    Aortic atherosclerosis ([place]) 03/16/2021   Noted 12/2020 abdominal CT   Arthritis    Cardiac murmur 07/02/2019   Celiac disease    Chronic laryngitis 07/22/2019   Chronic rhinitis 07/22/2019   Diverticulitis    DOE (dyspnea on exertion) 07/02/2019   Dysphagia 07/22/2019   Edema of both lower extremities    Essential hypertension 01/24/2018   GERD (gastroesophageal reflux disease)    History of malignant neoplasm of skin 03/08/2021   Hypertension    Hyperthyroidism    Hypothyroidism 01/24/2018   IBS (irritable bowel syndrome)    Laryngopharyngeal reflux 07/22/2019   Moderate aortic regurgitation 10/11/2019   Obesity (BMI 30-39.9) 04/01/2019   Osteoarthritis    Osteoporosis 04/01/2019   Preop cardiovascular exam 03/09/2021   Seborrheic dermatitis of scalp 03/08/2021   Sensorineural hearing loss 07/22/2019   Squamous cell carcinoma in situ 03/08/2021   Subjective tinnitus 07/22/2019   Thyroid disease    Thyroid nodule 07/22/2019    Past Surgical History:  Procedure Laterality Date   [place]     2007 said 13 years ago. 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   alendronate (FOSAMAX) 70 MG tablet TAKE 1 TABLET BY MOUTH ONCE A WEEK. TAKE WITH A FULL GLASS OF WATER ON AN EMPTY STOMACH.   estradiol (ESTRACE) 0.1 MG/GM vaginal cream PLACE 1 APPLICATORFUL VAGINALLY AT BEDTIME.   hydrochlorothiazide (HYDRODIURIL) 25 MG tablet TAKE 1 TABLET (25 MG TOTAL) BY MOUTH DAILY.   ipratropium (ATROVENT) 0.06 % nasal spray Place 2 sprays into both nostrils 3 (three) times daily.   lactobacillus acidophilus  (BACID) TABS tablet Take 2 tablets by mouth daily.   levothyroxine (SYNTHROID) 112 MCG tablet TAKE 1 TABLET BY MOUTH EVERY DAY BEFORE BREAKFAST   lisinopril (ZESTRIL) 30 MG tablet TAKE 1 TABLET BY MOUTH EVERY DAY   Melatonin-Pyridoxine (MELATIN) 3-1 MG TABS Take 1 tablet by mouth at bedtime.   Multiple Vitamin (MULTIVITAMIN WITH MINERALS) TABS tablet Take 1 tablet by mouth daily.   PAPAYA ENZYME PO Take 1 capsule by mouth daily.   rosuvastatin (CRESTOR) 20 MG tablet TAKE 1 TABLET BY MOUTH EVERY DAY   S-Adenosylmethionine (SAM-E) 400 MG TABS Take 400 mg by mouth 2 (two) times daily.   Triamcinolone Acetonide (NASACORT ALLERGY 24HR NA) Place 1-2 sprays into the nose as needed for allergies or rhinitis.     Allergies:   Bactrim [sulfamethoxazole-trimethoprim], Chlorine, Latex, Macrobid [nitrofurantoin], Nsaids, and Penicillins   Social History   Socioeconomic History   Marital status: Married    Spouse name: Shannon Bond   Number of children: 3   Years of education: Not on file   Highest education level: Not on file  Occupational History   Occupation: Retired   Tobacco Use   Smoking status: Never   Smokeless tobacco: Never   Tobacco comments:    admit to trying to smoke   Vaping Use   Vaping Use: Never used  Substance and Sexual Activity   Alcohol use: Not Currently    Comment: Patient is a alcoholic   Drug use: Never   Sexual activity: Not on file  Other Topics Concern   Not on file  Social History Narrative   Not on file   Social Determinants of Health   Financial Resource Strain: Not on file  Food Insecurity: Not on file  Transportation Needs: Not on file  Physical Activity: Not on file  Stress: Not on file  Social Connections: Not on file     Family History: The patient's family history includes AAA (abdominal aortic aneurysm) in her mother; Alcohol abuse in her father; Breast cancer in her maternal grandmother; Cancer in her maternal grandmother; Diabetes in her son;  Early death in her father and mother; Hypertension in her mother; Lung cancer in her father; Mental illness in her maternal grandfather. There is no history of Esophageal cancer, Colon cancer, Rectal cancer, or Stomach cancer.  ROS:   Please see the history of present illness.    All other systems reviewed and are negative.  EKGs/Labs/Other Studies Reviewed:    The following studies were reviewed today: I discussed my findings with the patient at length.   Recent Labs: 05/05/2021: ALT 13; BUN 47; Creatinine, Ser 1.00; Potassium 5.0; Sodium 136; TSH 0.88 05/14/2021: Hemoglobin 12.1; Platelets 314.0  Recent Lipid Panel    Component Value Date/Time   CHOL 106 05/05/2021 0907   TRIG 78.0 05/05/2021 0907   HDL 56.20 05/05/2021 0907   CHOLHDL 2 05/05/2021 0907   VLDL 15.6 05/05/2021 0907  [place] 34 05/05/2021 0907    Physical Exam:    VS:  BP 126/66   Pulse 76   Ht 5' 2"  (1.575 m)   Wt 153 lb 1.9 oz (69.5 kg)   SpO2 99%   BMI 28.01 kg/m     Wt Readings from Last 3 Encounters:  10/07/21 153 lb 1.9 oz (69.5 kg)  09/28/21 146 lb (66.2 kg)  08/23/21 148 lb (67.1 kg)     GEN: Patient is in no acute distress HEENT: Normal NECK: No JVD; No carotid bruits LYMPHATICS: No lymphadenopathy CARDIAC: Hear sounds regular, 2/6 systolic murmur at the apex. RESPIRATORY:  Clear to auscultation without rales, wheezing or rhonchi  ABDOMEN: Soft, non-tender, non-distended MUSCULOSKELETAL:  No edema; No deformity  SKIN: Warm and dry NEUROLOGIC:  Alert and oriented x 3 PSYCHIATRIC:  Normal affect   Signed, Shannon Lindau, MD  10/07/2021 2:45 PM    [facility]

## 2021-10-07 NOTE — Patient Instructions (Signed)
Medication Instructions:  Your physician recommends that you continue on your current medications as directed. Please refer to the Current Medication list given to you today.  *If you need a refill on your cardiac medications before your next appointment, please call your pharmacy*   Lab Work: None ordered If you have labs (blood work) drawn today and your tests are completely normal, you will receive your results only by: [place] (if you have MyChart) OR A paper copy in the mail If you have any lab test that is abnormal or we need to change your treatment, we will call you to review the results.   Testing/Procedures: None ordered   Follow-Up: At [facility], you and your health needs are our priority.  As part of our continuing mission to provide you with exceptional heart care, we have created designated Provider Care Teams.  These Care Teams include your primary Cardiologist (physician) and Advanced Practice Providers (APPs -  Physician Assistants and Nurse Practitioners) who all work together to provide you with the care you need, when you need it.  We recommend signing up for the patient portal called "MyChart".  Sign up information is provided on this After Visit Summary.  MyChart is used to connect with patients for Virtual Visits (Telemedicine).  Patients are able to view lab/test results, encounter notes, upcoming appointments, etc.  Non-urgent messages can be sent to your provider as well.   To learn more about what you can do with MyChart, go to [email or web address].    Your next appointment:   12 month(s)  The format for your next appointment:   In Person  Provider:   Jyl Heinz, MD   Other Instructions NA

## 2021-10-12 ENCOUNTER — Encounter: Payer: PPO | Admitting: Family Medicine

## 2021-10-16 ENCOUNTER — Other Ambulatory Visit: Payer: Self-pay | Admitting: Family Medicine

## 2021-10-18 ENCOUNTER — Ambulatory Visit (INDEPENDENT_AMBULATORY_CARE_PROVIDER_SITE_OTHER): Payer: PPO | Admitting: Family Medicine

## 2021-10-18 ENCOUNTER — Encounter: Payer: Self-pay | Admitting: Family Medicine

## 2021-10-18 VITALS — BP 120/60 | HR 75 | Temp 98.0°F | Ht 62.0 in | Wt 154.1 lb

## 2021-10-18 DIAGNOSIS — E039 Hypothyroidism, unspecified: Secondary | ICD-10-CM

## 2021-10-18 DIAGNOSIS — Z0000 Encounter for general adult medical examination without abnormal findings: Secondary | ICD-10-CM

## 2021-10-18 LAB — COMPREHENSIVE METABOLIC PANEL
ALT: 19 U/L (ref 0–35)
AST: 23 U/L (ref 0–37)
Albumin: 4.3 g/dL (ref 3.5–5.2)
Alkaline Phosphatase: 40 U/L (ref 39–117)
BUN: 48 mg/dL — ABNORMAL HIGH (ref 6–23)
CO2: 23 mEq/L (ref 19–32)
Calcium: 9.2 mg/dL (ref 8.4–10.5)
Chloride: 105 mEq/L (ref 96–112)
Creatinine, Ser: 1.03 mg/dL (ref 0.40–1.20)
GFR: 51.48 mL/min — ABNORMAL LOW (ref >60.00)
Glucose, Bld: 87 mg/dL (ref 70–99)
Potassium: 4.5 mEq/L (ref 3.5–5.1)
Sodium: 136 mEq/L (ref 135–145)
Total Bilirubin: 0.5 mg/dL (ref 0.2–1.2)
Total Protein: 6.4 g/dL (ref 6.0–8.3)

## 2021-10-18 LAB — CBC
HCT: 37.1 % (ref 36.0–46.0)
Hemoglobin: 12.2 g/dL (ref 12.0–15.0)
MCHC: 32.8 g/dL (ref 30.0–36.0)
MCV: 98.2 fl (ref 78.0–100.0)
Platelets: 259 10*3/uL (ref 150.0–400.0)
RBC: 3.78 Mil/uL — ABNORMAL LOW (ref 3.87–5.11)
RDW: 13.5 % (ref 11.5–15.5)
WBC: 6.3 10*3/uL (ref 4.0–10.5)

## 2021-10-18 LAB — LIPID PANEL
Cholesterol: 114 mg/dL (ref 0–200)
HDL: 56.9 mg/dL (ref >39.00)
LDL Cholesterol: 40 mg/dL (ref 0–99)
NonHDL: 57.53
Total CHOL/HDL Ratio: 2
Triglycerides: 86 mg/dL (ref 0.0–149.0)
VLDL: 17.2 mg/dL (ref 0.0–40.0)

## 2021-10-18 LAB — TSH: TSH: 2.93 u[IU]/mL (ref 0.35–5.50)

## 2021-10-18 NOTE — Progress Notes (Signed)
Chief Complaint  Patient presents with   Annual Exam     Well Woman Shannon Bond is here for a complete physical.   Her last physical was >1 year ago.  Current diet: in general, a "healthy" diet. Current exercise: walking, wt resistance exercises, yoga. Weight is stable and she denies daytime fatigue. Seatbelt? Yes Advanced directive? No  Health Maintenance Shingrix- Yes DEXA- Yes Tetanus- Yes Pneumonia- Yes  Past Medical History:  Diagnosis Date   Alcohol abuse    Allergy    Alopecia    Aortic atherosclerosis ([place]) 03/16/2021   Noted 12/2020 abdominal CT   Arthritis    Cardiac murmur 07/02/2019   Celiac disease    Chronic laryngitis 07/22/2019   Chronic rhinitis 07/22/2019   Diverticulitis    DOE (dyspnea on exertion) 07/02/2019   Dysphagia 07/22/2019   Edema of both lower extremities    Essential hypertension 01/24/2018   GERD (gastroesophageal reflux disease)    History of malignant neoplasm of skin 03/08/2021   Hypertension    Hyperthyroidism    Hypothyroidism 01/24/2018   IBS (irritable bowel syndrome)    Laryngopharyngeal reflux 07/22/2019   Moderate aortic regurgitation 10/11/2019   Obesity (BMI 30-39.9) 04/01/2019   Osteoarthritis    Osteoporosis 04/01/2019   Preop cardiovascular exam 03/09/2021   Seborrheic dermatitis of scalp 03/08/2021   Sensorineural hearing loss 07/22/2019   Squamous cell carcinoma in situ 03/08/2021   Subjective tinnitus 07/22/2019   Thyroid disease    Thyroid nodule 07/22/2019     Past Surgical History:  Procedure Laterality Date   [place]     2007 said 13 years ago. Said that she does the hemoocult cards test at home    ESOPHAGOGASTRODUODENOSCOPY     2014   [place] BILATERAL  2003    Medications  Current Outpatient Medications on File Prior to Visit  Medication Sig Dispense Refill   acetaminophen (TYLENOL) 325 MG tablet Take 650 mg by mouth every 6 (six)  hours as needed for headache.     alendronate (FOSAMAX) 70 MG tablet TAKE 1 TABLET BY MOUTH ONCE A WEEK. TAKE WITH A FULL GLASS OF WATER ON AN EMPTY STOMACH. 12 tablet 2   estradiol (ESTRACE) 0.1 MG/GM vaginal cream PLACE 1 APPLICATORFUL VAGINALLY AT BEDTIME. 42.5 g 1   hydrochlorothiazide (HYDRODIURIL) 25 MG tablet TAKE 1 TABLET (25 MG TOTAL) BY MOUTH DAILY. 90 tablet 1   ipratropium (ATROVENT) 0.06 % nasal spray Place 2 sprays into both nostrils 3 (three) times daily. 135 mL 3   lactobacillus acidophilus (BACID) TABS tablet Take 2 tablets by mouth daily.     levothyroxine (SYNTHROID) 112 MCG tablet TAKE 1 TABLET BY MOUTH EVERY DAY BEFORE BREAKFAST 90 tablet 0   lisinopril (ZESTRIL) 30 MG tablet TAKE 1 TABLET BY MOUTH EVERY DAY 90 tablet 1   Melatonin-Pyridoxine (MELATIN) 3-1 MG TABS Take 1 tablet by mouth at bedtime.     Multiple Vitamin (MULTIVITAMIN WITH MINERALS) TABS tablet Take 1 tablet by mouth daily.     PAPAYA ENZYME PO Take 1 capsule by mouth daily.     rosuvastatin (CRESTOR) 20 MG tablet TAKE 1 TABLET BY MOUTH EVERY DAY 90 tablet 3   S-Adenosylmethionine (SAM-E) 400 MG TABS Take 400 mg by mouth 2 (two) times daily.     Triamcinolone Acetonide (NASACORT ALLERGY 24HR NA) Place 1-2 sprays into the nose as needed for allergies or  rhinitis.     Allergies Allergies  Allergen Reactions   Bactrim [Sulfamethoxazole-Trimethoprim]     Nausea, Vomiting, Abdominal Pain   Chlorine    Latex    Macrobid [Nitrofurantoin]     Diarrhea   Nsaids     Said it upset her stomach really bad    Penicillins     Review of Systems: Constitutional:  no fevers Eye:  no recent significant change in vision Ears:  No changes in hearing Nose/Mouth/Throat:  no complaints of nasal congestion, no sore throat Cardiovascular: no chest pain Respiratory:  No shortness of breath Gastrointestinal:  No change in bowel habits GU:  Female: negative for dysuria Integumentary:  no abnormal skin lesions  reported Neurologic:  no headaches Endocrine:  denies unexplained weight changes  Exam BP 120/60   Pulse 75   Temp 98 F (36.7 C) (Oral)   Ht 5' 2"  (1.575 m)   Wt 154 lb 2 oz (69.9 kg)   SpO2 99%   BMI 28.19 kg/m  General:  well developed, well nourished, in no apparent distress Skin:  no significant moles, warts, or growths Head:  no masses, lesions, or tenderness Eyes:  pupils equal and round, sclera anicteric without injection Ears:  canals without lesions, TMs shiny without retraction, no obvious effusion, no erythema Nose:  nares patent, septum midline, mucosa normal, and no drainage or sinus tenderness Throat/Pharynx:  lips and gingiva without lesion; tongue and uvula midline; non-inflamed pharynx; no exudates or postnasal drainage Neck: neck supple without adenopathy, thyromegaly, or masses Lungs:  clear to auscultation, breath sounds equal bilaterally, no respiratory distress Cardio:  regular rate and rhythm, no bruits or LE edema Abdomen:  abdomen soft, nontender; bowel sounds normal; no masses or organomegaly Genital: Deferred Neuro:  gait normal; deep tendon reflexes normal and symmetric Psych: well oriented with normal range of affect and appropriate judgment/insight  Assessment and Plan  Well adult exam - Plan: CBC, Comprehensive metabolic panel, Lipid panel  Hypothyroidism, unspecified type - Plan: TSH   Well 81 y.o. female. Counseled on diet and exercise. Other orders as above. Advanced directive form provided today.  Follow up in 6 mo. The patient voiced understanding and agreement to the plan.  [place], DO 10/18/21 11:00 AM

## 2021-10-18 NOTE — Patient Instructions (Signed)
Give [place] 2-3 business days to get the results of your labs back.   Stay active and continue making healthy eating choices.  Please get me a copy of your advanced directive form at your convenience.   Let us know if you need anything.

## 2021-10-20 ENCOUNTER — Other Ambulatory Visit: Payer: Self-pay | Admitting: Family Medicine

## 2021-10-21 ENCOUNTER — Encounter: Payer: Self-pay | Admitting: Family Medicine

## 2021-10-21 MED ORDER — IPRATROPIUM BROMIDE 0.06 % NA SOLN: 2.0000 | Freq: Three times a day (TID) | NASAL | 1 refills | Status: DC

## 2021-10-26 MED ORDER — IPRATROPIUM BROMIDE 0.06 % NA SOLN: 2.0000 | Freq: Three times a day (TID) | NASAL | 1 refills | Status: DC

## 2021-10-27 ENCOUNTER — Encounter (INDEPENDENT_AMBULATORY_CARE_PROVIDER_SITE_OTHER): Payer: Self-pay | Admitting: Bariatrics

## 2021-10-27 ENCOUNTER — Ambulatory Visit (INDEPENDENT_AMBULATORY_CARE_PROVIDER_SITE_OTHER): Payer: PPO | Admitting: Bariatrics

## 2021-10-27 VITALS — BP 124/71 | HR 71 | Temp 97.5°F | Ht 62.0 in | Wt 148.0 lb

## 2021-10-27 DIAGNOSIS — Z6827 Body mass index (BMI) 27.0-27.9, adult: Secondary | ICD-10-CM

## 2021-10-27 DIAGNOSIS — E669 Obesity, unspecified: Secondary | ICD-10-CM

## 2021-10-27 DIAGNOSIS — I10 Essential (primary) hypertension: Secondary | ICD-10-CM | POA: Diagnosis not present

## 2021-10-27 DIAGNOSIS — R632 Polyphagia: Secondary | ICD-10-CM

## 2021-10-27 DIAGNOSIS — E782 Mixed hyperlipidemia: Secondary | ICD-10-CM | POA: Diagnosis not present

## 2021-10-27 NOTE — Progress Notes (Signed)
Chief Complaint:   OBESITY Shannon Bond is here to discuss her progress with her obesity treatment plan along with follow-up of her obesity related diagnoses. Shannon Bond is on the Category 1 Plan and states she is following her eating plan approximately 99% of the time. Shannon Bond states she is walking, cardio, yoga for 60 minutes 7 times per week.  Today's visit was #: 67 Starting weight: 183 lbs Starting date: 06/24/2019 Today's weight: 148 lbs Today's date: 10/27/2021 Total lbs lost to date: 35 Total lbs lost since last in-office visit: 0  Interim History: Shannon Bond is up 2 lbs since her last visit. Her body water is up about 4 lbs since her last visit per our bioimpedance scale. She notes she is hungry in the afternoon.   Subjective:   1. Mixed dyslipidemia Shannon Bond is taking Crestor, and she denies myalgias.   2. Primary hypertension Shannon Bond's blood pressure is controlled. She is stable on her medications.   3. Polyphagia Shannon Bond notes polyphagia in the afternoon.   Assessment/Plan:   1. Mixed dyslipidemia Shannon Bond will continue her current medications.   2. Primary hypertension Shannon Bond will continue her medications, and she will eliminate added salt.   3. Polyphagia Shannon Bond is to add some chia seed and flax seed. She is to increase her vegetable intake or Metamucil, or fiber thins (gluten free).   4. Obesity, Current BMI 27.1 Shannon Bond is currently in the action stage of change. As such, her goal is to continue with weight loss efforts. She has agreed to the Category 1 Plan.   Shannon Bond will keep her water intake high about 64-80 ounces per day.  Exercise goals: As is.   Behavioral modification strategies: increasing lean protein intake, decreasing simple carbohydrates, increasing vegetables, increasing water intake, decreasing eating out, no skipping meals, meal planning and cooking strategies, keeping healthy foods in the home, and planning for success.  Shannon Bond has agreed to follow-up  with our clinic in 4 weeks. She was informed of the importance of frequent follow-up visits to maximize her success with intensive lifestyle modifications for her multiple health conditions.   Objective:   Blood pressure 124/71, pulse 71, temperature (!) 97.5 F (36.4 C), height 5' 2"  (1.575 m), weight 148 lb (67.1 kg), SpO2 96 %. Body mass index is 27.07 kg/m.  General: Cooperative, alert, well developed, in no acute distress. HEENT: Conjunctivae and lids unremarkable. Cardiovascular: Regular rhythm.  Lungs: Normal work of breathing. Neurologic: No focal deficits.   Lab Results  Component Value Date   CREATININE 1.03 10/18/2021   BUN 48 (H) 10/18/2021   NA 136 10/18/2021   K 4.5 10/18/2021   CL 105 10/18/2021   CO2 23 10/18/2021   Lab Results  Component Value Date   ALT 19 10/18/2021   AST 23 10/18/2021   ALKPHOS 40 10/18/2021   BILITOT 0.5 10/18/2021   Lab Results  Component Value Date   HGBA1C 5.4 02/26/2021   HGBA1C 5.1 06/24/2019   Lab Results  Component Value Date   INSULIN 5.6 06/24/2019   Lab Results  Component Value Date   TSH 2.93 10/18/2021   Lab Results  Component Value Date   CHOL 114 10/18/2021   HDL 56.90 10/18/2021   LDLCALC 40 10/18/2021   TRIG 86.0 10/18/2021   CHOLHDL 2 10/18/2021   Lab Results  Component Value Date   VD25OH 67.28 05/05/2021   VD25OH 75.6 06/22/2020   VD25OH 89.6 03/16/2020   Lab Results  Component Value Date  WBC 6.3 10/18/2021   HGB 12.2 10/18/2021   HCT 37.1 10/18/2021   MCV 98.2 10/18/2021   PLT 259.0 10/18/2021   Lab Results  Component Value Date   IRON 84 05/14/2021   TIBC 285.6 05/14/2021   FERRITIN 92.1 05/14/2021   Attestation Statements:   Reviewed by clinician on day of visit: allergies, medications, problem list, medical history, surgical history, family history, social history, and previous encounter notes.   Wilhemena Durie, am acting as Location manager for [employer], DO.  I have  reviewed the above documentation for accuracy and completeness, and I agree with the above. Jearld Lesch, DO

## 2021-11-01 ENCOUNTER — Encounter (INDEPENDENT_AMBULATORY_CARE_PROVIDER_SITE_OTHER): Payer: Self-pay | Admitting: Bariatrics

## 2021-11-24 ENCOUNTER — Encounter (INDEPENDENT_AMBULATORY_CARE_PROVIDER_SITE_OTHER): Payer: Self-pay

## 2021-11-26 DIAGNOSIS — Z961 Presence of intraocular lens: Secondary | ICD-10-CM | POA: Diagnosis not present

## 2021-11-26 DIAGNOSIS — H43813 Vitreous degeneration, bilateral: Secondary | ICD-10-CM | POA: Diagnosis not present

## 2021-11-26 DIAGNOSIS — H26492 Other secondary cataract, left eye: Secondary | ICD-10-CM | POA: Diagnosis not present

## 2021-11-26 DIAGNOSIS — H02831 Dermatochalasis of right upper eyelid: Secondary | ICD-10-CM | POA: Diagnosis not present

## 2021-11-26 IMAGING — US US RENAL
1 series · 14 of 25 positions shown · non-contrast
Comparison: Ultrasound 01/14/2020.

CLINICAL DATA: Acute renal injury. Chronic renal disease. Left
renal mass.

EXAM:
RENAL / URINARY TRACT ULTRASOUND COMPLETE

[Series 1: us renal · 0.23mm/px · 14 of 41 slices shown]
[im 1/41]
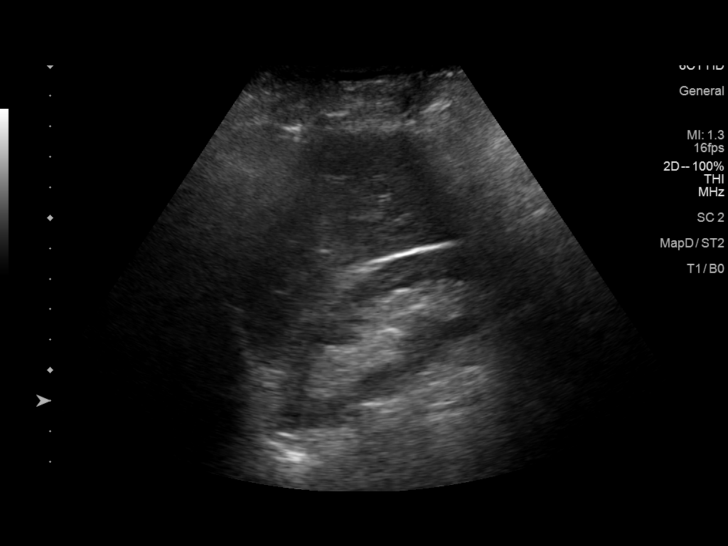
[im 4/41]
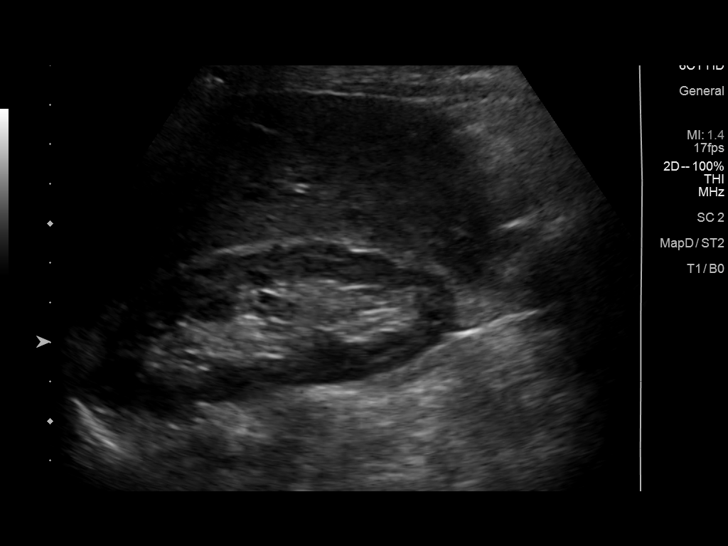
[im 7/41]
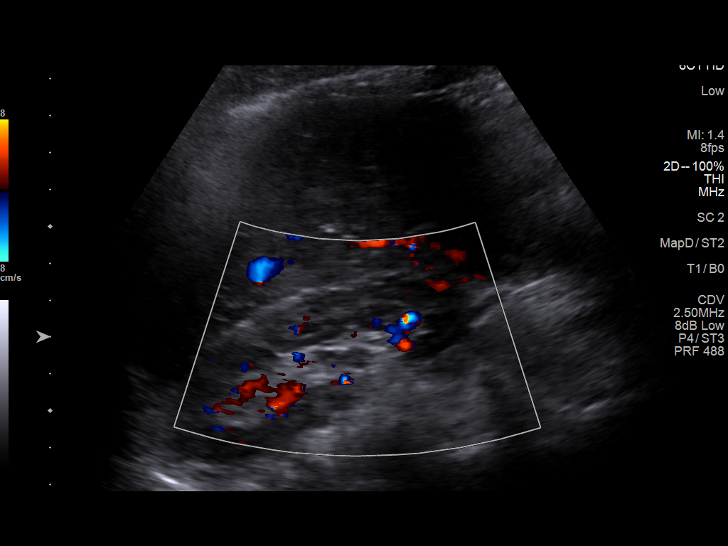
[im 11/41]
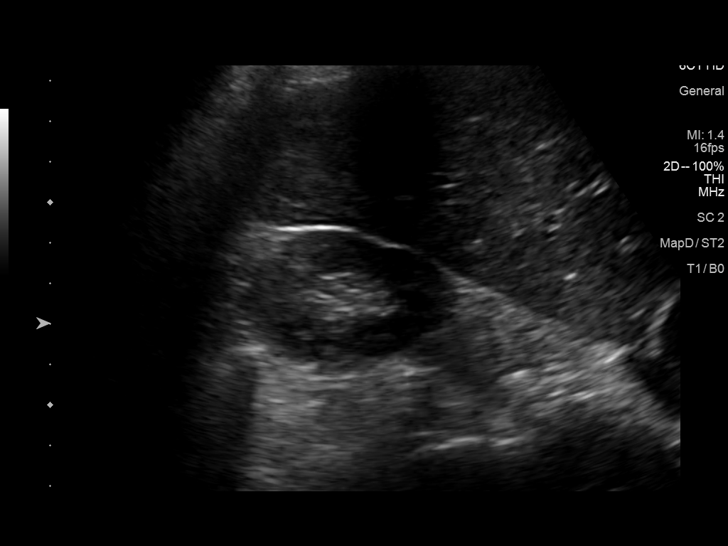
[im 14/41]
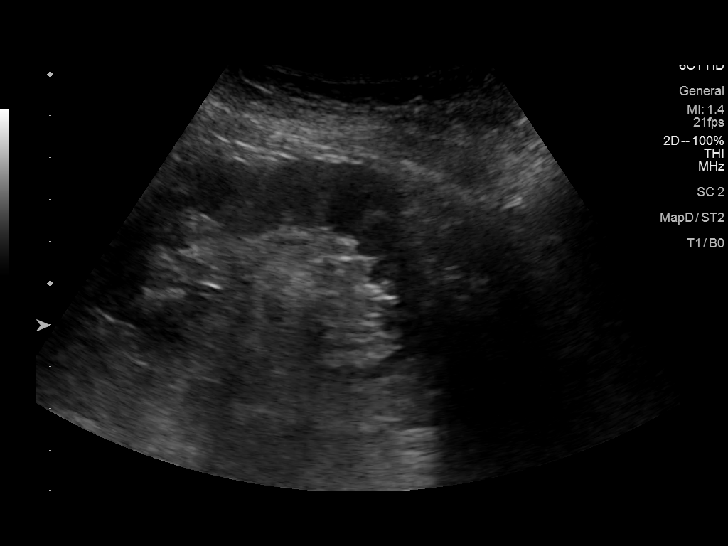
[im 16/41]
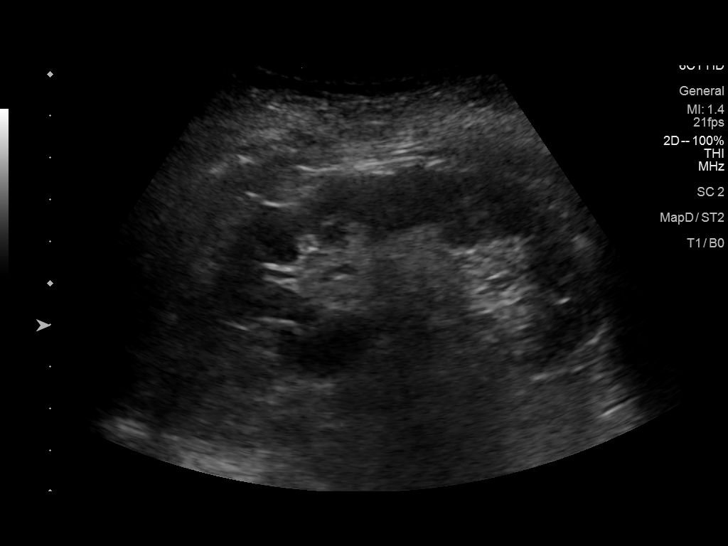
[im 19/41]
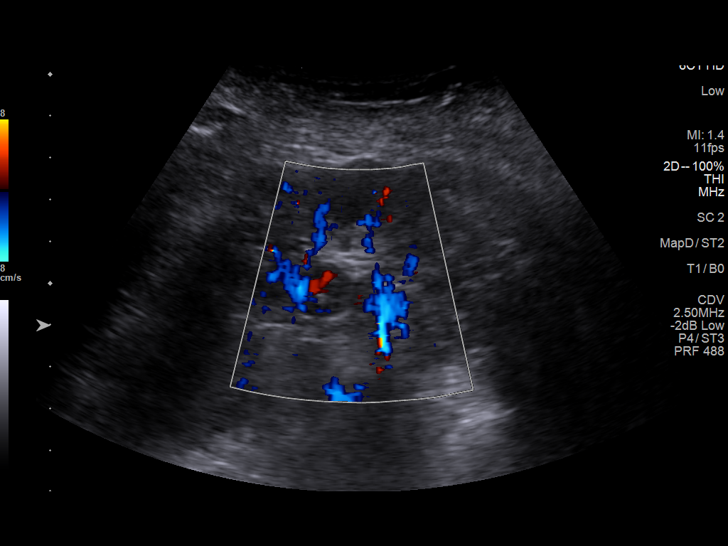
[im 22/41]
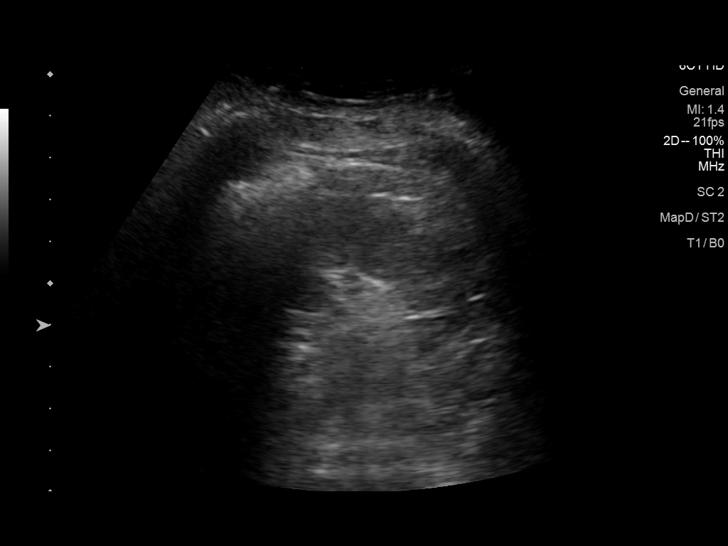
[im 26/41]
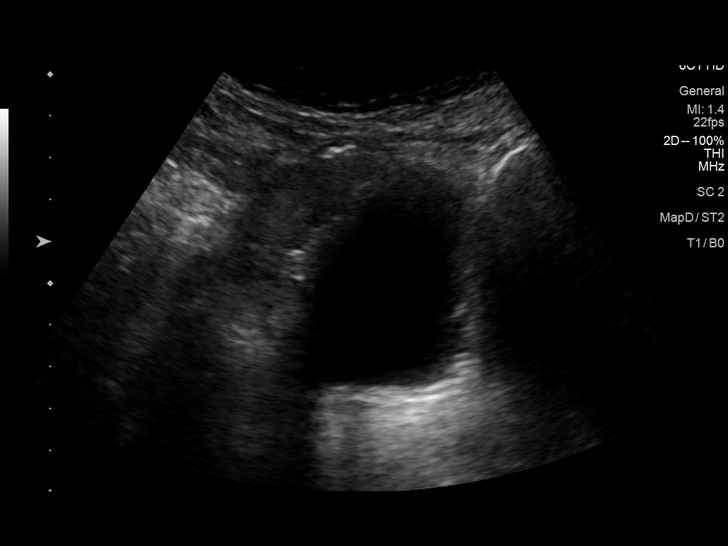
[im 27/41]
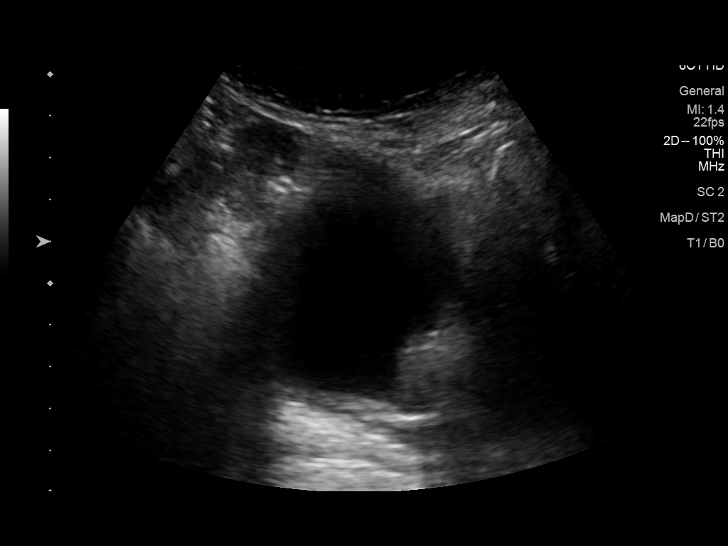
[im 31/41]
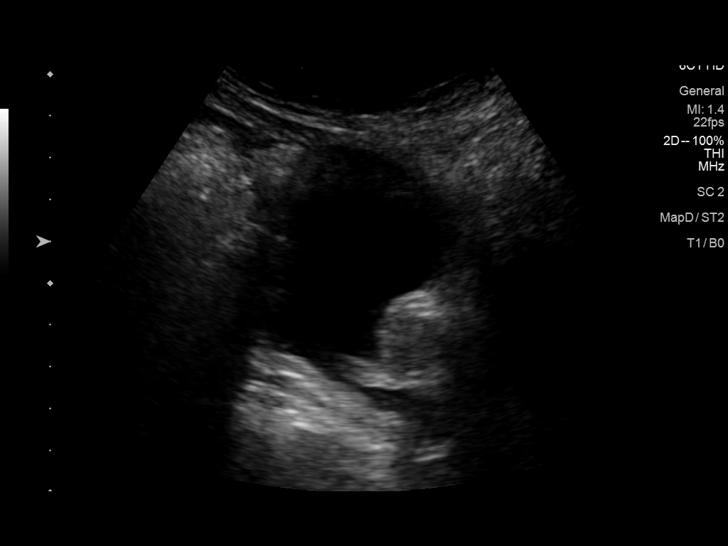
[im 34/41]
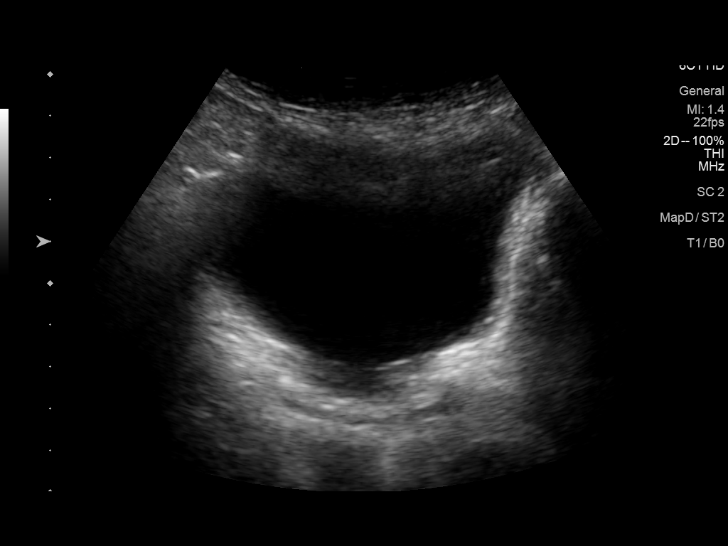
[im 37/41]
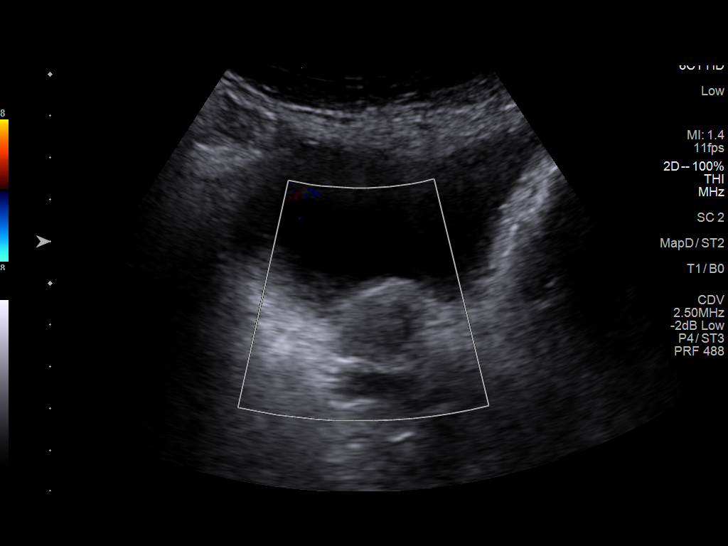
[im 41/41]
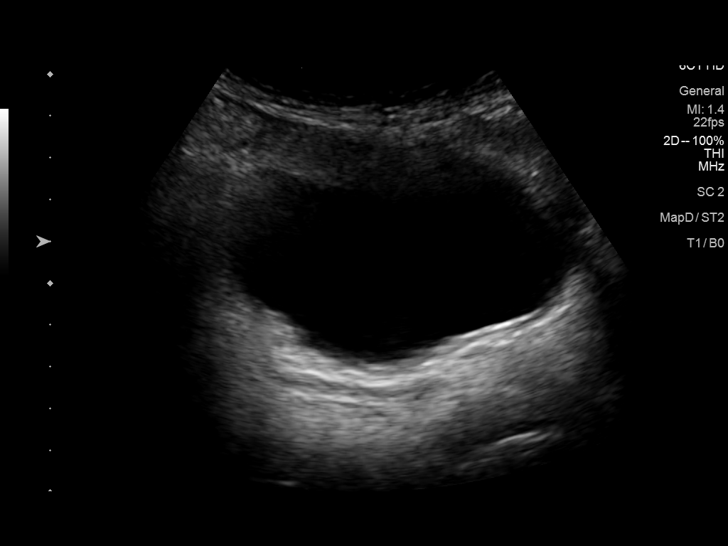

[14 of 25 positions shown; findings below may reference images not displayed]

FINDINGS: Right Kidney:

Renal measurements: 10.1 x 3.6 x 4.3 cm = volume: 81.0 mL.
Echogenicity within normal limits. No mass or hydronephrosis
visualized.

Left Kidney:

Renal measurements: 8.8 x 4.7 x 4.8 cm = volume: 105.9 mL.
Echogenicity within normal limits. Renal cortical irregularity
consistent scarring again noted. No mass identified. Previously
identified left renal mass not definitely visualized on today's
exam. No hydronephrosis visualized.

Bladder:

A bladder mass can not be excluded on today's exam.

Other:

None.
IMPRESSION: 1. Left renal cortical irregularity consistent scarring again noted.
Previously identified left renal mass not definitely identified on
today's exam.

2. A bladder mass can not be excluded on today's exam. Nonenhanced
and enhanced CT of the abdomen and pelvis may prove useful for
further evaluation.

## 2021-11-29 ENCOUNTER — Ambulatory Visit (INDEPENDENT_AMBULATORY_CARE_PROVIDER_SITE_OTHER): Payer: PPO | Admitting: Bariatrics

## 2021-11-29 ENCOUNTER — Encounter (INDEPENDENT_AMBULATORY_CARE_PROVIDER_SITE_OTHER): Payer: Self-pay | Admitting: Bariatrics

## 2021-11-29 VITALS — BP 117/69 | HR 78 | Temp 97.5°F | Ht 62.0 in | Wt 147.0 lb

## 2021-11-29 DIAGNOSIS — Z6826 Body mass index (BMI) 26.0-26.9, adult: Secondary | ICD-10-CM

## 2021-11-29 DIAGNOSIS — E782 Mixed hyperlipidemia: Secondary | ICD-10-CM

## 2021-11-29 DIAGNOSIS — E669 Obesity, unspecified: Secondary | ICD-10-CM

## 2021-12-02 NOTE — Progress Notes (Signed)
Chief Complaint:   OBESITY Shannon Bond is here to discuss her progress with her obesity treatment plan along with follow-up of her obesity related diagnoses. Shannon Bond is on the Category 1 Plan and states she is following her eating plan approximately 99% of the time. Shannon Bond states she is doing pool exercise, yogurt, resistance training for 60 minutes 7 days/week.  Today's visit was #: 4 Starting weight: 183 lbs Starting date: 06/24/2019 Today's weight: 147 lbs Today's date: 11/29/21 Total lbs lost to date: 36 Total lbs lost since last in-office visit: -1  Interim History: She is down 1 pound since her last visit.  She has been struggling with her weight.  Subjective:   1. Mixed dyslipidemia Taking Crestor.   Assessment/Plan:   1. Mixed dyslipidemia Continue Crestor.  2. Obesity, Current BMI 26.9 1.  Meal planning 2.  Intentional eating 3.  Increase protein  Shannon Bond is currently in the action stage of change. As such, her goal is to continue with weight loss efforts. She has agreed to the Category 1 Plan.   Exercise goals: Exercising in the pool, resistance training.  Behavioral modification strategies: increasing lean protein intake, decreasing simple carbohydrates, increasing vegetables, increasing water intake, decreasing eating out, no skipping meals, meal planning and cooking strategies, keeping healthy foods in the home, and planning for success.  Shannon Bond has agreed to follow-up with our clinic in 4 weeks. She was informed of the importance of frequent follow-up visits to maximize her success with intensive lifestyle modifications for her multiple health conditions.    Objective:   Blood pressure 117/69, pulse 78, temperature (!) 97.5 F (36.4 C), height 5' 2"  (1.575 m), weight 147 lb (66.7 kg), SpO2 96 %. Body mass index is 26.89 kg/m.  General: Cooperative, alert, well developed, in no acute distress. HEENT: Conjunctivae and lids unremarkable. Cardiovascular:  Regular rhythm.  Lungs: Normal work of breathing. Neurologic: No focal deficits.   Lab Results  Component Value Date   CREATININE 1.03 10/18/2021   BUN 48 (H) 10/18/2021   NA 136 10/18/2021   K 4.5 10/18/2021   CL 105 10/18/2021   CO2 23 10/18/2021   Lab Results  Component Value Date   ALT 19 10/18/2021   AST 23 10/18/2021   ALKPHOS 40 10/18/2021   BILITOT 0.5 10/18/2021   Lab Results  Component Value Date   HGBA1C 5.4 02/26/2021   HGBA1C 5.1 06/24/2019   Lab Results  Component Value Date   INSULIN 5.6 06/24/2019   Lab Results  Component Value Date   TSH 2.93 10/18/2021   Lab Results  Component Value Date   CHOL 114 10/18/2021   HDL 56.90 10/18/2021   LDLCALC 40 10/18/2021   TRIG 86.0 10/18/2021   CHOLHDL 2 10/18/2021   Lab Results  Component Value Date   VD25OH 67.28 05/05/2021   VD25OH 75.6 06/22/2020   VD25OH 89.6 03/16/2020   Lab Results  Component Value Date   WBC 6.3 10/18/2021   HGB 12.2 10/18/2021   HCT 37.1 10/18/2021   MCV 98.2 10/18/2021   PLT 259.0 10/18/2021   Lab Results  Component Value Date   IRON 84 05/14/2021   TIBC 285.6 05/14/2021   FERRITIN 92.1 05/14/2021    Attestation Statements:   Reviewed by clinician on day of visit: allergies, medications, problem list, medical history, surgical history, family history, social history, and previous encounter notes.  I, Dawn Whitmire, FNP-C, am acting as transcriptionist for Dr. Jearld Lesch.  I have reviewed  the above documentation for accuracy and completeness, and I agree with the above. Jearld Lesch, DO

## 2021-12-22 ENCOUNTER — Other Ambulatory Visit: Payer: Self-pay | Admitting: Family Medicine

## 2021-12-22 DIAGNOSIS — I10 Essential (primary) hypertension: Secondary | ICD-10-CM

## 2021-12-30 ENCOUNTER — Ambulatory Visit (INDEPENDENT_AMBULATORY_CARE_PROVIDER_SITE_OTHER): Payer: PPO | Admitting: Bariatrics

## 2021-12-30 ENCOUNTER — Encounter: Payer: Self-pay | Admitting: Bariatrics

## 2021-12-30 VITALS — BP 121/63 | HR 88 | Temp 98.2°F | Ht 62.0 in | Wt 147.0 lb

## 2021-12-30 DIAGNOSIS — E038 Other specified hypothyroidism: Secondary | ICD-10-CM | POA: Diagnosis not present

## 2021-12-30 DIAGNOSIS — E669 Obesity, unspecified: Secondary | ICD-10-CM | POA: Diagnosis not present

## 2021-12-30 DIAGNOSIS — Z6827 Body mass index (BMI) 27.0-27.9, adult: Secondary | ICD-10-CM

## 2021-12-30 DIAGNOSIS — E7849 Other hyperlipidemia: Secondary | ICD-10-CM | POA: Diagnosis not present

## 2021-12-30 DIAGNOSIS — E039 Hypothyroidism, unspecified: Secondary | ICD-10-CM

## 2022-01-03 DIAGNOSIS — E7849 Other hyperlipidemia: Secondary | ICD-10-CM

## 2022-01-03 HISTORY — DX: Other hyperlipidemia: E78.49

## 2022-01-05 ENCOUNTER — Encounter: Payer: Self-pay | Admitting: Bariatrics

## 2022-01-05 NOTE — Progress Notes (Signed)
Chief Complaint:   OBESITY Shannon Bond is here to discuss her progress with her obesity treatment plan along with follow-up of her obesity related diagnoses. Shannon Bond is on the Category 1 Plan and states she is following her eating plan approximately 99% of the time. Shannon Bond states she is walking, resistance, cardio, and yoga for 30-60 minutes 7 times per week.  Today's visit was #: 36 Starting weight: 183 lbs Starting date: 06/24/2019 Today's weight: 147 lbs Today's date: 12/30/2021 Total lbs lost to date: 36 Total lbs lost since last in-office visit: 0  Interim History: Shannon Bond's weight remains the same. She is in the maintenance mode.   Subjective:   1. Other hyperlipidemia Shannon Bond's last HDL was 56.90. She is taking Crestor.   2. Other specified hypothyroidism Shannon Bond is taking Synthroid.   Assessment/Plan:   1. Other hyperlipidemia Shannon Bond will continue Crestor as directed.   2. Other specified hypothyroidism Shannon Bond will continue Synthroid as directed.   3. Obesity, Current BMI 27.0 Shannon Bond is currently in the action stage of change. As such, her goal is to maintain weight for now. She has agreed to the Category 1 Plan.   Keep water and protein high. Up to 115 grams daily.  Exercise goals: As is.   Behavioral modification strategies: increasing lean protein intake, decreasing simple carbohydrates, increasing vegetables, increasing water intake, decreasing eating out, no skipping meals, meal planning and cooking strategies, keeping healthy foods in the home, and planning for success.  Shannon Bond has agreed to follow-up with our clinic in 4 weeks. She was informed of the importance of frequent follow-up visits to maximize her success with intensive lifestyle modifications for her multiple health conditions.   Objective:   Blood pressure 121/63, pulse 88, temperature 98.2 F (36.8 C), height 5' 2"  (1.575 m), weight 147 lb (66.7 kg), SpO2 97 %. Body mass index is 26.89  kg/m.  General: Cooperative, alert, well developed, in no acute distress. HEENT: Conjunctivae and lids unremarkable. Cardiovascular: Regular rhythm.  Lungs: Normal work of breathing. Neurologic: No focal deficits.   Lab Results  Component Value Date   CREATININE 1.03 10/18/2021   BUN 48 (H) 10/18/2021   NA 136 10/18/2021   K 4.5 10/18/2021   CL 105 10/18/2021   CO2 23 10/18/2021   Lab Results  Component Value Date   ALT 19 10/18/2021   AST 23 10/18/2021   ALKPHOS 40 10/18/2021   BILITOT 0.5 10/18/2021   Lab Results  Component Value Date   HGBA1C 5.4 02/26/2021   HGBA1C 5.1 06/24/2019   Lab Results  Component Value Date   INSULIN 5.6 06/24/2019   Lab Results  Component Value Date   TSH 2.93 10/18/2021   Lab Results  Component Value Date   CHOL 114 10/18/2021   HDL 56.90 10/18/2021   LDLCALC 40 10/18/2021   TRIG 86.0 10/18/2021   CHOLHDL 2 10/18/2021   Lab Results  Component Value Date   VD25OH 67.28 05/05/2021   VD25OH 75.6 06/22/2020   VD25OH 89.6 03/16/2020   Lab Results  Component Value Date   WBC 6.3 10/18/2021   HGB 12.2 10/18/2021   HCT 37.1 10/18/2021   MCV 98.2 10/18/2021   PLT 259.0 10/18/2021   Lab Results  Component Value Date   IRON 84 05/14/2021   TIBC 285.6 05/14/2021   FERRITIN 92.1 05/14/2021   Attestation Statements:   Reviewed by clinician on day of visit: allergies, medications, problem list, medical history, surgical history, family history, social history,  and previous encounter notes.   Wilhemena Durie, am acting as Location manager for [employer], DO.  I have reviewed the above documentation for accuracy and completeness, and I agree with the above. Jearld Lesch, DO

## 2022-01-13 ENCOUNTER — Other Ambulatory Visit: Payer: Self-pay | Admitting: Family Medicine

## 2022-01-27 ENCOUNTER — Ambulatory Visit (INDEPENDENT_AMBULATORY_CARE_PROVIDER_SITE_OTHER): Payer: PPO | Admitting: Bariatrics

## 2022-01-27 ENCOUNTER — Encounter: Payer: Self-pay | Admitting: Bariatrics

## 2022-01-27 VITALS — BP 124/69 | HR 79 | Temp 97.8°F | Ht 62.0 in | Wt 146.0 lb

## 2022-01-27 DIAGNOSIS — Z6826 Body mass index (BMI) 26.0-26.9, adult: Secondary | ICD-10-CM | POA: Diagnosis not present

## 2022-01-27 DIAGNOSIS — I10 Essential (primary) hypertension: Secondary | ICD-10-CM

## 2022-01-27 DIAGNOSIS — E038 Other specified hypothyroidism: Secondary | ICD-10-CM

## 2022-01-27 DIAGNOSIS — E669 Obesity, unspecified: Secondary | ICD-10-CM | POA: Insufficient documentation

## 2022-01-27 HISTORY — DX: Obesity, unspecified: E66.9

## 2022-02-02 NOTE — Progress Notes (Unsigned)
Chief Complaint:   OBESITY Shannon Bond is here to discuss her progress with her obesity treatment plan along with follow-up of her obesity related diagnoses. Shannon Bond is on the Category 1 Plan and states she is following her eating plan approximately 98% of the time. Shannon Bond states she is walking for 60 minutes 7 times per week.  Today's visit was #: 63 Starting weight: 183 lbs Starting date: 06/24/2019 Today's weight: 146 lbs Today's date: 01/27/2022 Total lbs lost to date: 37 Total lbs lost since last in-office visit: 1  Interim History: [place] is down 1 pound since her last visit and she is doing well with maintaining her weight.  She is following her meal plan closely.  Subjective:   1. Other specified hypothyroidism Shannon Bond is taking her medications as directed.  2. Essential hypertension Shannon Bond's blood pressure is currently controlled.  Assessment/Plan:   1. Other specified hypothyroidism Shannon Bond will continue her medications as directed.  2. Essential hypertension Shannon Bond will continue to eliminate added salt, and continue with her medications and exercise.  3. Obesity, Current BMI 26.8 Shannon Bond is currently in the action stage of change. As such, her goal is to continue with weight loss efforts. She has agreed to the Category 1 Plan.   She will continue to adhere to the plan closely.  Continue exercise.  Tips to lose belly fat were given.  Exercise goals: As is.   Behavioral modification strategies: increasing lean protein intake, decreasing simple carbohydrates, increasing vegetables, increasing water intake, decreasing eating out, no skipping meals, meal planning and cooking strategies, keeping healthy foods in the home, and planning for success.  Shannon Bond has agreed to follow-up with our clinic in 4 weeks. She was informed of the importance of frequent follow-up visits to maximize her success with intensive lifestyle modifications for her multiple health conditions.    Objective:   Blood pressure 124/69, pulse 79, temperature 97.8 F (36.6 C), height 5' 2"  (1.575 m), weight 146 lb (66.2 kg), SpO2 97 %. Body mass index is 26.7 kg/m.  General: Cooperative, alert, well developed, in no acute distress. HEENT: Conjunctivae and lids unremarkable. Cardiovascular: Regular rhythm.  Lungs: Normal work of breathing. Neurologic: No focal deficits.   Lab Results  Component Value Date   CREATININE 1.03 10/18/2021   BUN 48 (H) 10/18/2021   NA 136 10/18/2021   K 4.5 10/18/2021   CL 105 10/18/2021   CO2 23 10/18/2021   Lab Results  Component Value Date   ALT 19 10/18/2021   AST 23 10/18/2021   ALKPHOS 40 10/18/2021   BILITOT 0.5 10/18/2021   Lab Results  Component Value Date   HGBA1C 5.4 02/26/2021   HGBA1C 5.1 06/24/2019   Lab Results  Component Value Date   INSULIN 5.6 06/24/2019   Lab Results  Component Value Date   TSH 2.93 10/18/2021   Lab Results  Component Value Date   CHOL 114 10/18/2021   HDL 56.90 10/18/2021   LDLCALC 40 10/18/2021   TRIG 86.0 10/18/2021   CHOLHDL 2 10/18/2021   Lab Results  Component Value Date   VD25OH 67.28 05/05/2021   VD25OH 75.6 06/22/2020   VD25OH 89.6 03/16/2020   Lab Results  Component Value Date   WBC 6.3 10/18/2021   HGB 12.2 10/18/2021   HCT 37.1 10/18/2021   MCV 98.2 10/18/2021   PLT 259.0 10/18/2021   Lab Results  Component Value Date   IRON 84 05/14/2021   TIBC 285.6 05/14/2021   FERRITIN 92.1  05/14/2021   Attestation Statements:   Reviewed by clinician on day of visit: allergies, medications, problem list, medical history, surgical history, family history, social history, and previous encounter notes.   Shannon Bond, am acting as Location manager for [employer], DO.  I have reviewed the above documentation for accuracy and completeness, and I agree with the above. Shannon Lesch, DO

## 2022-02-03 ENCOUNTER — Encounter: Payer: Self-pay | Admitting: Bariatrics

## 2022-02-09 ENCOUNTER — Other Ambulatory Visit: Payer: Self-pay | Admitting: Family Medicine

## 2022-02-17 DIAGNOSIS — L821 Other seborrheic keratosis: Secondary | ICD-10-CM | POA: Diagnosis not present

## 2022-02-17 DIAGNOSIS — Z86018 Personal history of other benign neoplasm: Secondary | ICD-10-CM | POA: Diagnosis not present

## 2022-02-17 DIAGNOSIS — D229 Melanocytic nevi, unspecified: Secondary | ICD-10-CM | POA: Diagnosis not present

## 2022-02-17 DIAGNOSIS — L814 Other melanin hyperpigmentation: Secondary | ICD-10-CM | POA: Diagnosis not present

## 2022-02-17 DIAGNOSIS — L570 Actinic keratosis: Secondary | ICD-10-CM | POA: Diagnosis not present

## 2022-02-17 DIAGNOSIS — Z85828 Personal history of other malignant neoplasm of skin: Secondary | ICD-10-CM | POA: Diagnosis not present

## 2022-02-17 DIAGNOSIS — L719 Rosacea, unspecified: Secondary | ICD-10-CM | POA: Diagnosis not present

## 2022-02-23 ENCOUNTER — Encounter: Payer: Self-pay | Admitting: Bariatrics

## 2022-02-23 ENCOUNTER — Ambulatory Visit (INDEPENDENT_AMBULATORY_CARE_PROVIDER_SITE_OTHER): Payer: PPO | Admitting: Bariatrics

## 2022-02-23 VITALS — BP 116/68 | HR 75 | Temp 97.8°F | Ht 62.0 in | Wt 148.0 lb

## 2022-02-23 DIAGNOSIS — E669 Obesity, unspecified: Secondary | ICD-10-CM | POA: Diagnosis not present

## 2022-02-23 DIAGNOSIS — Z6827 Body mass index (BMI) 27.0-27.9, adult: Secondary | ICD-10-CM

## 2022-02-23 DIAGNOSIS — E782 Mixed hyperlipidemia: Secondary | ICD-10-CM

## 2022-02-23 DIAGNOSIS — I10 Essential (primary) hypertension: Secondary | ICD-10-CM | POA: Diagnosis not present

## 2022-03-03 DIAGNOSIS — M5442 Lumbago with sciatica, left side: Secondary | ICD-10-CM | POA: Diagnosis not present

## 2022-03-03 DIAGNOSIS — M25552 Pain in left hip: Secondary | ICD-10-CM | POA: Diagnosis not present

## 2022-03-07 ENCOUNTER — Encounter: Payer: Self-pay | Admitting: Bariatrics

## 2022-03-07 NOTE — Progress Notes (Signed)
Chief Complaint:   OBESITY Shannon Bond is here to discuss her progress with her obesity treatment plan along with follow-up of her obesity related diagnoses. Shannon Bond is on the Category 1 Plan and states she is following her eating plan approximately 90% of the time. Shannon Bond states she is walking and doing resistance for 60 minutes 7 times per week.  Today's visit was #: 29 Starting weight: 183 lbs Starting date: 06/24/2019 Today's weight: 148 lbs Today's date: 02/23/2022 Total lbs lost to date: 35 Total lbs lost since last in-office visit: 0  Interim History: Shannon Bond is up 2 lbs since her last visit. She is up 2 lbs of water since her last visit per the bioimpedance.  Subjective:   1. Essential hypertension Shannon Bond's blood pressure is well controlled.   2. Mixed dyslipidemia Shannon Bond is taking Crestor.   Assessment/Plan:   1. Essential hypertension Shannon Bond will continue her current  medications, and minimize her salt intake.   2. Mixed dyslipidemia Shannon Bond will continue Crestor, and eliminate trans fats.   3. Obesity, Current BMI 27.1 Shannon Bond is currently in the action stage of change. As such, her goal is to continue with weight loss efforts. She has agreed to the Category 1 Plan.   She will keep her protein, fiver, and water intake high. She will increase her fiber slowly.   Exercise goals: As is.   Behavioral modification strategies: increasing lean protein intake, decreasing simple carbohydrates, increasing vegetables, increasing water intake, decreasing eating out, no skipping meals, meal planning and cooking strategies, keeping healthy foods in the home, and planning for success.  Shannon Bond has agreed to follow-up with our clinic in 4 weeks. She was informed of the importance of frequent follow-up visits to maximize her success with intensive lifestyle modifications for her multiple health conditions.   Objective:   Blood pressure 116/68, pulse 75, temperature 97.8 F (36.6 C),  height 5' 2"  (1.575 m), weight 148 lb (67.1 kg), SpO2 98 %. Body mass index is 27.07 kg/m.  General: Cooperative, alert, well developed, in no acute distress. HEENT: Conjunctivae and lids unremarkable. Cardiovascular: Regular rhythm.  Lungs: Normal work of breathing. Neurologic: No focal deficits.   Lab Results  Component Value Date   CREATININE 1.03 10/18/2021   BUN 48 (H) 10/18/2021   NA 136 10/18/2021   K 4.5 10/18/2021   CL 105 10/18/2021   CO2 23 10/18/2021   Lab Results  Component Value Date   ALT 19 10/18/2021   AST 23 10/18/2021   ALKPHOS 40 10/18/2021   BILITOT 0.5 10/18/2021   Lab Results  Component Value Date   HGBA1C 5.4 02/26/2021   HGBA1C 5.1 06/24/2019   Lab Results  Component Value Date   INSULIN 5.6 06/24/2019   Lab Results  Component Value Date   TSH 2.93 10/18/2021   Lab Results  Component Value Date   CHOL 114 10/18/2021   HDL 56.90 10/18/2021   LDLCALC 40 10/18/2021   TRIG 86.0 10/18/2021   CHOLHDL 2 10/18/2021   Lab Results  Component Value Date   VD25OH 67.28 05/05/2021   VD25OH 75.6 06/22/2020   VD25OH 89.6 03/16/2020   Lab Results  Component Value Date   WBC 6.3 10/18/2021   HGB 12.2 10/18/2021   HCT 37.1 10/18/2021   MCV 98.2 10/18/2021   PLT 259.0 10/18/2021   Lab Results  Component Value Date   IRON 84 05/14/2021   TIBC 285.6 05/14/2021   FERRITIN 92.1 05/14/2021   Attestation Statements:  Reviewed by clinician on day of visit: allergies, medications, problem list, medical history, surgical history, family history, social history, and previous encounter notes.   Wilhemena Durie, am acting as Location manager for [employer], DO.  I have reviewed the above documentation for accuracy and completeness, and I agree with the above. Jearld Lesch, DO

## 2022-03-11 DIAGNOSIS — M545 Low back pain, unspecified: Secondary | ICD-10-CM | POA: Diagnosis not present

## 2022-03-15 DIAGNOSIS — M545 Low back pain, unspecified: Secondary | ICD-10-CM | POA: Diagnosis not present

## 2022-03-17 ENCOUNTER — Other Ambulatory Visit: Payer: Self-pay | Admitting: Family Medicine

## 2022-03-18 DIAGNOSIS — M48062 Spinal stenosis, lumbar region with neurogenic claudication: Secondary | ICD-10-CM | POA: Diagnosis not present

## 2022-03-22 ENCOUNTER — Other Ambulatory Visit: Payer: Self-pay | Admitting: Family Medicine

## 2022-03-28 ENCOUNTER — Ambulatory Visit (INDEPENDENT_AMBULATORY_CARE_PROVIDER_SITE_OTHER): Payer: PPO | Admitting: Bariatrics

## 2022-03-28 ENCOUNTER — Encounter: Payer: Self-pay | Admitting: Bariatrics

## 2022-03-28 VITALS — BP 118/68 | HR 72 | Temp 97.6°F | Ht 62.0 in | Wt 148.0 lb

## 2022-03-28 DIAGNOSIS — Z6827 Body mass index (BMI) 27.0-27.9, adult: Secondary | ICD-10-CM | POA: Diagnosis not present

## 2022-03-28 DIAGNOSIS — E038 Other specified hypothyroidism: Secondary | ICD-10-CM

## 2022-03-28 DIAGNOSIS — E7849 Other hyperlipidemia: Secondary | ICD-10-CM | POA: Diagnosis not present

## 2022-03-28 DIAGNOSIS — E669 Obesity, unspecified: Secondary | ICD-10-CM

## 2022-03-31 DIAGNOSIS — M48062 Spinal stenosis, lumbar region with neurogenic claudication: Secondary | ICD-10-CM | POA: Diagnosis not present

## 2022-04-12 ENCOUNTER — Encounter: Payer: Self-pay | Admitting: Bariatrics

## 2022-04-12 NOTE — Progress Notes (Unsigned)
Chief Complaint:   OBESITY Shannon Bond is here to discuss her progress with her obesity treatment plan along with follow-up of her obesity related diagnoses. Shannon Bond is on the Category 1 Plan and states she is following her eating plan approximately 99% of the time. Shannon Bond states she is walking for 60 minutes 7 times per week.  Today's visit was #: 19 Starting weight: 183 lbs Starting date: 06/24/2019 Today's weight: 148 lbs Today's date: 03/28/2022 Total lbs lost to date: 35 Total lbs lost since last in-office visit: 0  Interim History: Shannon Bond's weight remains the same.  She is eating more calories than 1,000 daily.  Subjective:   1. Other hyperlipidemia Shannon Bond is taking Crestor.  2. Other specified hypothyroidism Shannon Bond is taking Synthroid.  Assessment/Plan:   1. Other hyperlipidemia Shannon Bond will continue Crestor.  She will eat healthy fats and decrease unhealthy fats.  2. Other specified hypothyroidism Shannon Bond will continue Synthroid as directed.  3. Obesity, Current BMI 27.1 Shannon Bond is currently in the action stage of change. As such, her goal is to continue with weight loss efforts. She has agreed to keeping a food journal and adhering to recommended goals of 1200 calories and 80 grams of protein.   Journaling numbers guide was given.  She will stay at a stable weight.  Keep protein and water intake high.  Exercise goals: As is.  Will go to physical therapy for exercise training and back damage.  Behavioral modification strategies: increasing lean protein intake, decreasing simple carbohydrates, increasing vegetables, increasing water intake, decreasing eating out, no skipping meals, meal planning and cooking strategies, keeping healthy foods in the home, and holiday eating strategies .  Shannon Bond has agreed to follow-up with our clinic in 4 weeks. She was informed of the importance of frequent follow-up visits to maximize her success with intensive lifestyle modifications for  her multiple health conditions.   Objective:   Blood pressure 118/68, pulse 72, temperature 97.6 F (36.4 C), height 5' 2"  (1.575 m), SpO2 97 %. Body mass index is 27.07 kg/m.  General: Cooperative, alert, well developed, in no acute distress. HEENT: Conjunctivae and lids unremarkable. Cardiovascular: Regular rhythm.  Lungs: Normal work of breathing. Neurologic: No focal deficits.   Lab Results  Component Value Date   CREATININE 1.03 10/18/2021   BUN 48 (H) 10/18/2021   NA 136 10/18/2021   K 4.5 10/18/2021   CL 105 10/18/2021   CO2 23 10/18/2021   Lab Results  Component Value Date   ALT 19 10/18/2021   AST 23 10/18/2021   ALKPHOS 40 10/18/2021   BILITOT 0.5 10/18/2021   Lab Results  Component Value Date   HGBA1C 5.4 02/26/2021   HGBA1C 5.1 06/24/2019   Lab Results  Component Value Date   INSULIN 5.6 06/24/2019   Lab Results  Component Value Date   TSH 2.93 10/18/2021   Lab Results  Component Value Date   CHOL 114 10/18/2021   HDL 56.90 10/18/2021   LDLCALC 40 10/18/2021   TRIG 86.0 10/18/2021   CHOLHDL 2 10/18/2021   Lab Results  Component Value Date   VD25OH 67.28 05/05/2021   VD25OH 75.6 06/22/2020   VD25OH 89.6 03/16/2020   Lab Results  Component Value Date   WBC 6.3 10/18/2021   HGB 12.2 10/18/2021   HCT 37.1 10/18/2021   MCV 98.2 10/18/2021   PLT 259.0 10/18/2021   Lab Results  Component Value Date   IRON 84 05/14/2021   TIBC 285.6 05/14/2021  FERRITIN 92.1 05/14/2021   Attestation Statements:   Reviewed by clinician on day of visit: allergies, medications, problem list, medical history, surgical history, family history, social history, and previous encounter notes.   Wilhemena Durie, am acting as Location manager for [employer], DO.  I have reviewed the above documentation for accuracy and completeness, and I agree with the above. Jearld Lesch, DO

## 2022-04-14 ENCOUNTER — Encounter: Payer: Self-pay | Admitting: Bariatrics

## 2022-04-22 DIAGNOSIS — M48062 Spinal stenosis, lumbar region with neurogenic claudication: Secondary | ICD-10-CM | POA: Diagnosis not present

## 2022-04-25 ENCOUNTER — Ambulatory Visit (INDEPENDENT_AMBULATORY_CARE_PROVIDER_SITE_OTHER): Payer: PPO | Admitting: Family Medicine

## 2022-04-25 ENCOUNTER — Encounter: Payer: Self-pay | Admitting: Family Medicine

## 2022-04-25 VITALS — BP 102/64 | HR 78 | Temp 98.0°F | Resp 16 | Ht 62.0 in | Wt 155.4 lb

## 2022-04-25 DIAGNOSIS — I10 Essential (primary) hypertension: Secondary | ICD-10-CM

## 2022-04-25 DIAGNOSIS — E038 Other specified hypothyroidism: Secondary | ICD-10-CM

## 2022-04-25 DIAGNOSIS — I700 Atherosclerosis of aorta: Secondary | ICD-10-CM | POA: Diagnosis not present

## 2022-04-25 LAB — COMPREHENSIVE METABOLIC PANEL
ALT: 16 U/L (ref 0–35)
AST: 20 U/L (ref 0–37)
Albumin: 4.2 g/dL (ref 3.5–5.2)
Alkaline Phosphatase: 38 U/L — ABNORMAL LOW (ref 39–117)
BUN: 40 mg/dL — ABNORMAL HIGH (ref 6–23)
CO2: 25 mEq/L (ref 19–32)
Calcium: 9.3 mg/dL (ref 8.4–10.5)
Chloride: 106 mEq/L (ref 96–112)
Creatinine, Ser: 0.93 mg/dL (ref 0.40–1.20)
GFR: 57.98 mL/min — ABNORMAL LOW (ref >60.00)
Glucose, Bld: 92 mg/dL (ref 70–99)
Potassium: 4.9 mEq/L (ref 3.5–5.1)
Sodium: 140 mEq/L (ref 135–145)
Total Bilirubin: 0.5 mg/dL (ref 0.2–1.2)
Total Protein: 6.5 g/dL (ref 6.0–8.3)

## 2022-04-25 LAB — LIPID PANEL
Cholesterol: 113 mg/dL (ref 0–200)
HDL: 56.1 mg/dL (ref >39.00)
LDL Cholesterol: 37 mg/dL (ref 0–99)
NonHDL: 56.79
Total CHOL/HDL Ratio: 2
Triglycerides: 99 mg/dL (ref 0.0–149.0)
VLDL: 19.8 mg/dL (ref 0.0–40.0)

## 2022-04-25 LAB — CBC
HCT: 37.5 % (ref 36.0–46.0)
Hemoglobin: 12.5 g/dL (ref 12.0–15.0)
MCHC: 33.3 g/dL (ref 30.0–36.0)
MCV: 99.2 fl (ref 78.0–100.0)
Platelets: 287 10*3/uL (ref 150.0–400.0)
RBC: 3.78 Mil/uL — ABNORMAL LOW (ref 3.87–5.11)
RDW: 13.9 % (ref 11.5–15.5)
WBC: 6.4 10*3/uL (ref 4.0–10.5)

## 2022-04-25 LAB — TSH: TSH: 3.28 u[IU]/mL (ref 0.35–5.50)

## 2022-04-25 MED ORDER — IPRATROPIUM BROMIDE 0.06 % NA SOLN: 2.0000 | Freq: Three times a day (TID) | NASAL | 3 refills | Status: DC

## 2022-04-25 NOTE — Patient Instructions (Signed)
Give us 2-3 business days to get the results of your labs back.   Keep the diet clean and stay active.  Let us know if you need anything. 

## 2022-04-25 NOTE — Progress Notes (Signed)
CC: F/u  Subjective: Hyperlipidemia Patient presents for Hyperlipidemia follow up. Currently taking Crestor 20 mg/d and compliance with treatment thus far has been good. She denies myalgias. She is adhering to a healthy diet. Exercise: walking, some wt resistance exercise The patient is not known to have coexisting coronary artery disease.  Hypertension Patient presents for hypertension follow up. She does monitor home blood pressures. She is compliant with medications- lisinopril 30 mg/d, HCTZ 25 mg/d. Patient has these side effects of medication: none Diet/exercise as above. No CP or SOB.  Hypothyroidism Patient presents for follow-up of hypothyroidism.  Reports compliance with medication. Current symptoms include: denies fatigue, weight changes, heat/cold intolerance, bowel/skin changes or CVS symptoms She believes her dose should be not significantly changed   Past Medical History:  Diagnosis Date   Alcohol abuse    Allergy    Alopecia    Aortic atherosclerosis ([place]) 03/16/2021   Noted 12/2020 abdominal CT   Arthritis    Cardiac murmur 07/02/2019   Celiac disease    Chronic laryngitis 07/22/2019   Chronic rhinitis 07/22/2019   Diverticulitis    DOE (dyspnea on exertion) 07/02/2019   Dysphagia 07/22/2019   Edema of both lower extremities    Essential hypertension 01/24/2018   GERD (gastroesophageal reflux disease)    History of malignant neoplasm of skin 03/08/2021   Hypertension    Hypothyroidism 01/24/2018   Laryngopharyngeal reflux 07/22/2019   Moderate aortic regurgitation 10/11/2019   Obesity (BMI 30-39.9) 04/01/2019   Osteoarthritis    Osteoporosis 04/01/2019   Preop cardiovascular exam 03/09/2021   Seborrheic dermatitis of scalp 03/08/2021   Sensorineural hearing loss 07/22/2019   Squamous cell carcinoma in situ 03/08/2021   Subjective tinnitus 07/22/2019   Thyroid disease    Thyroid nodule 07/22/2019    Objective: BP 102/64   Pulse 78   Temp  98 F (36.7 C)   Resp 16   Ht '5\' 2"'$  (1.575 m)   Wt 155 lb 6.4 oz (70.5 kg)   SpO2 97%   BMI 28.42 kg/m  General: Awake, appears stated age HEENT: MMM Heart: RRR, no LE edema, no bruits Lungs: CTAB, no rales, wheezes or rhonchi. No accessory muscle use Psych: Age appropriate judgment and insight, normal affect and mood  Assessment and Plan: Aortic atherosclerosis ([facility]) - Plan: Comprehensive metabolic panel, Lipid panel  Essential hypertension - Plan: CBC  Other specified hypothyroidism - Plan: TSH  Chronic, stable. Cont Crestor 20 mg/d. Counseled on diet/exercise.  Chronic, stable. Cont lisinopril 30 mg/d, HCTZ 25 mg/d.  Chronic, stable. Cont levothyroxine 112 mcg/d.  F/u in 6 mo. The patient voiced understanding and agreement to the plan.  Ubly, DO 04/25/22  10:39 AM

## 2022-04-26 ENCOUNTER — Ambulatory Visit (INDEPENDENT_AMBULATORY_CARE_PROVIDER_SITE_OTHER): Payer: PPO | Admitting: Bariatrics

## 2022-04-26 ENCOUNTER — Encounter: Payer: Self-pay | Admitting: Bariatrics

## 2022-04-26 VITALS — BP 128/78 | HR 82 | Temp 98.0°F | Ht 62.0 in | Wt 150.0 lb

## 2022-04-26 DIAGNOSIS — E669 Obesity, unspecified: Secondary | ICD-10-CM | POA: Diagnosis not present

## 2022-04-26 DIAGNOSIS — I10 Essential (primary) hypertension: Secondary | ICD-10-CM

## 2022-04-26 DIAGNOSIS — Z6827 Body mass index (BMI) 27.0-27.9, adult: Secondary | ICD-10-CM

## 2022-04-26 DIAGNOSIS — E782 Mixed hyperlipidemia: Secondary | ICD-10-CM

## 2022-04-27 DIAGNOSIS — M25652 Stiffness of left hip, not elsewhere classified: Secondary | ICD-10-CM | POA: Diagnosis not present

## 2022-04-27 DIAGNOSIS — M25651 Stiffness of right hip, not elsewhere classified: Secondary | ICD-10-CM | POA: Diagnosis not present

## 2022-04-27 DIAGNOSIS — M2569 Stiffness of other specified joint, not elsewhere classified: Secondary | ICD-10-CM | POA: Diagnosis not present

## 2022-04-27 DIAGNOSIS — M545 Low back pain, unspecified: Secondary | ICD-10-CM | POA: Diagnosis not present

## 2022-04-29 ENCOUNTER — Encounter: Payer: Self-pay | Admitting: Family Medicine

## 2022-04-29 DIAGNOSIS — M545 Low back pain, unspecified: Secondary | ICD-10-CM | POA: Diagnosis not present

## 2022-04-29 DIAGNOSIS — M2569 Stiffness of other specified joint, not elsewhere classified: Secondary | ICD-10-CM | POA: Diagnosis not present

## 2022-04-29 DIAGNOSIS — M25651 Stiffness of right hip, not elsewhere classified: Secondary | ICD-10-CM | POA: Diagnosis not present

## 2022-04-29 DIAGNOSIS — M25652 Stiffness of left hip, not elsewhere classified: Secondary | ICD-10-CM | POA: Diagnosis not present

## 2022-05-02 DIAGNOSIS — M25652 Stiffness of left hip, not elsewhere classified: Secondary | ICD-10-CM | POA: Diagnosis not present

## 2022-05-02 DIAGNOSIS — M545 Low back pain, unspecified: Secondary | ICD-10-CM | POA: Diagnosis not present

## 2022-05-02 DIAGNOSIS — M2569 Stiffness of other specified joint, not elsewhere classified: Secondary | ICD-10-CM | POA: Diagnosis not present

## 2022-05-02 DIAGNOSIS — M25651 Stiffness of right hip, not elsewhere classified: Secondary | ICD-10-CM | POA: Diagnosis not present

## 2022-05-06 DIAGNOSIS — M545 Low back pain, unspecified: Secondary | ICD-10-CM | POA: Diagnosis not present

## 2022-05-06 DIAGNOSIS — M25651 Stiffness of right hip, not elsewhere classified: Secondary | ICD-10-CM | POA: Diagnosis not present

## 2022-05-06 DIAGNOSIS — M25652 Stiffness of left hip, not elsewhere classified: Secondary | ICD-10-CM | POA: Diagnosis not present

## 2022-05-06 DIAGNOSIS — M2569 Stiffness of other specified joint, not elsewhere classified: Secondary | ICD-10-CM | POA: Diagnosis not present

## 2022-05-07 NOTE — Progress Notes (Signed)
Chief Complaint:   OBESITY Shannon Bond is here to discuss her progress with her obesity treatment plan along with follow-up of her obesity related diagnoses. Shannon Bond is on keeping a food journal and adhering to recommended goals of 1200 calories and 80 grams protein and states she is following her eating plan approximately 97% of the time. Shannon Bond states she is walking 60 minutes 7 times per week.  Today's visit was #: 30 Starting weight: 183 lbs Starting date: 06/24/2019 Today's weight: 150 lbs Today's date: 04/26/2022 Total lbs lost to date: 33 Total lbs lost since last in-office visit: +2  Interim History: Shannon Bond is up 2 lbs since her last visit over the holidays. Her goal this year is to meet all of her fitness goals.  Subjective:   1. Essential hypertension Shannon Bond's BP controlled.  2. Mixed dyslipidemia Shannon Bond is taking Crestor.  Assessment/Plan:   1. Essential hypertension Continue current medication regimen.  2. Mixed dyslipidemia Continue Crestor.  3. Obesity, Current BMI 27.5 Shannon Bond is currently in the action stage of change. As such, her goal is to continue with weight loss efforts. She has agreed to the Category 2 Plan and keeping a food journal and adhering to recommended goals of 1200 calories and 80 grams protein.   Meal planning Intentional eating  Exercise goals:  As is and PT.  Behavioral modification strategies: increasing lean protein intake, decreasing simple carbohydrates, increasing vegetables, increasing water intake, decreasing eating out, no skipping meals, meal planning and cooking strategies, keeping healthy foods in the home, and planning for success.  Shannon Bond has agreed to follow-up with our clinic in 4 weeks. She was informed of the importance of frequent follow-up visits to maximize her success with intensive lifestyle modifications for her multiple health conditions.   Objective:   Blood pressure 128/78, pulse 82, temperature 98 F (36.7 C),  height '5\' 2"'$  (1.575 m), weight 150 lb (68 kg), SpO2 98 %. Body mass index is 27.44 kg/m.  General: Cooperative, alert, well developed, in no acute distress. HEENT: Conjunctivae and lids unremarkable. Cardiovascular: Regular rhythm.  Lungs: Normal work of breathing. Neurologic: No focal deficits.   Lab Results  Component Value Date   CREATININE 0.93 04/25/2022   BUN 40 (H) 04/25/2022   NA 140 04/25/2022   K 4.9 04/25/2022   CL 106 04/25/2022   CO2 25 04/25/2022   Lab Results  Component Value Date   ALT 16 04/25/2022   AST 20 04/25/2022   ALKPHOS 38 (L) 04/25/2022   BILITOT 0.5 04/25/2022   Lab Results  Component Value Date   HGBA1C 5.4 02/26/2021   HGBA1C 5.1 06/24/2019   Lab Results  Component Value Date   INSULIN 5.6 06/24/2019   Lab Results  Component Value Date   TSH 3.28 04/25/2022   Lab Results  Component Value Date   CHOL 113 04/25/2022   HDL 56.10 04/25/2022   LDLCALC 37 04/25/2022   TRIG 99.0 04/25/2022   CHOLHDL 2 04/25/2022   Lab Results  Component Value Date   VD25OH 67.28 05/05/2021   VD25OH 75.6 06/22/2020   VD25OH 89.6 03/16/2020   Lab Results  Component Value Date   WBC 6.4 04/25/2022   HGB 12.5 04/25/2022   HCT 37.5 04/25/2022   MCV 99.2 04/25/2022   PLT 287.0 04/25/2022   Lab Results  Component Value Date   IRON 84 05/14/2021   TIBC 285.6 05/14/2021   FERRITIN 92.1 05/14/2021     Attestation Statements:   Reviewed  by clinician on day of visit: allergies, medications, problem list, medical history, surgical history, family history, social history, and previous encounter notes.  I, Kathlene November, BS, CMA, am acting as transcriptionist for [employer], DO.  I have reviewed the above documentation for accuracy and completeness, and I agree with the above. Jearld Lesch, DO

## 2022-05-09 ENCOUNTER — Encounter: Payer: Self-pay | Admitting: Bariatrics

## 2022-05-10 DIAGNOSIS — M545 Low back pain, unspecified: Secondary | ICD-10-CM | POA: Diagnosis not present

## 2022-05-10 DIAGNOSIS — M2569 Stiffness of other specified joint, not elsewhere classified: Secondary | ICD-10-CM | POA: Diagnosis not present

## 2022-05-10 DIAGNOSIS — M25652 Stiffness of left hip, not elsewhere classified: Secondary | ICD-10-CM | POA: Diagnosis not present

## 2022-05-10 DIAGNOSIS — M25651 Stiffness of right hip, not elsewhere classified: Secondary | ICD-10-CM | POA: Diagnosis not present

## 2022-05-13 DIAGNOSIS — M25652 Stiffness of left hip, not elsewhere classified: Secondary | ICD-10-CM | POA: Diagnosis not present

## 2022-05-13 DIAGNOSIS — M2569 Stiffness of other specified joint, not elsewhere classified: Secondary | ICD-10-CM | POA: Diagnosis not present

## 2022-05-13 DIAGNOSIS — M545 Low back pain, unspecified: Secondary | ICD-10-CM | POA: Diagnosis not present

## 2022-05-13 DIAGNOSIS — M25651 Stiffness of right hip, not elsewhere classified: Secondary | ICD-10-CM | POA: Diagnosis not present

## 2022-05-16 DIAGNOSIS — M545 Low back pain, unspecified: Secondary | ICD-10-CM | POA: Diagnosis not present

## 2022-05-16 DIAGNOSIS — M25652 Stiffness of left hip, not elsewhere classified: Secondary | ICD-10-CM | POA: Diagnosis not present

## 2022-05-16 DIAGNOSIS — M25651 Stiffness of right hip, not elsewhere classified: Secondary | ICD-10-CM | POA: Diagnosis not present

## 2022-05-16 DIAGNOSIS — M2569 Stiffness of other specified joint, not elsewhere classified: Secondary | ICD-10-CM | POA: Diagnosis not present

## 2022-05-20 DIAGNOSIS — M25651 Stiffness of right hip, not elsewhere classified: Secondary | ICD-10-CM | POA: Diagnosis not present

## 2022-05-20 DIAGNOSIS — M545 Low back pain, unspecified: Secondary | ICD-10-CM | POA: Diagnosis not present

## 2022-05-20 DIAGNOSIS — M2569 Stiffness of other specified joint, not elsewhere classified: Secondary | ICD-10-CM | POA: Diagnosis not present

## 2022-05-20 DIAGNOSIS — M25652 Stiffness of left hip, not elsewhere classified: Secondary | ICD-10-CM | POA: Diagnosis not present

## 2022-05-24 DIAGNOSIS — M2569 Stiffness of other specified joint, not elsewhere classified: Secondary | ICD-10-CM | POA: Diagnosis not present

## 2022-05-24 DIAGNOSIS — M25651 Stiffness of right hip, not elsewhere classified: Secondary | ICD-10-CM | POA: Diagnosis not present

## 2022-05-24 DIAGNOSIS — M25652 Stiffness of left hip, not elsewhere classified: Secondary | ICD-10-CM | POA: Diagnosis not present

## 2022-05-24 DIAGNOSIS — M545 Low back pain, unspecified: Secondary | ICD-10-CM | POA: Diagnosis not present

## 2022-05-25 ENCOUNTER — Encounter: Payer: Self-pay | Admitting: Bariatrics

## 2022-05-25 ENCOUNTER — Ambulatory Visit (INDEPENDENT_AMBULATORY_CARE_PROVIDER_SITE_OTHER): Payer: PPO | Admitting: Bariatrics

## 2022-05-25 VITALS — BP 118/72 | HR 74 | Temp 97.6°F | Ht 62.0 in | Wt 150.0 lb

## 2022-05-25 DIAGNOSIS — I10 Essential (primary) hypertension: Secondary | ICD-10-CM

## 2022-05-25 DIAGNOSIS — Z6827 Body mass index (BMI) 27.0-27.9, adult: Secondary | ICD-10-CM

## 2022-05-25 DIAGNOSIS — E669 Obesity, unspecified: Secondary | ICD-10-CM

## 2022-05-25 DIAGNOSIS — E782 Mixed hyperlipidemia: Secondary | ICD-10-CM | POA: Diagnosis not present

## 2022-05-25 HISTORY — DX: Body mass index (BMI) 27.0-27.9, adult: Z68.27

## 2022-05-27 DIAGNOSIS — M25652 Stiffness of left hip, not elsewhere classified: Secondary | ICD-10-CM | POA: Diagnosis not present

## 2022-05-27 DIAGNOSIS — M545 Low back pain, unspecified: Secondary | ICD-10-CM | POA: Diagnosis not present

## 2022-05-27 DIAGNOSIS — M2569 Stiffness of other specified joint, not elsewhere classified: Secondary | ICD-10-CM | POA: Diagnosis not present

## 2022-05-27 DIAGNOSIS — M25651 Stiffness of right hip, not elsewhere classified: Secondary | ICD-10-CM | POA: Diagnosis not present

## 2022-05-30 DIAGNOSIS — M25651 Stiffness of right hip, not elsewhere classified: Secondary | ICD-10-CM | POA: Diagnosis not present

## 2022-05-30 DIAGNOSIS — M25652 Stiffness of left hip, not elsewhere classified: Secondary | ICD-10-CM | POA: Diagnosis not present

## 2022-05-30 DIAGNOSIS — M2569 Stiffness of other specified joint, not elsewhere classified: Secondary | ICD-10-CM | POA: Diagnosis not present

## 2022-05-30 DIAGNOSIS — M545 Low back pain, unspecified: Secondary | ICD-10-CM | POA: Diagnosis not present

## 2022-06-03 DIAGNOSIS — M25652 Stiffness of left hip, not elsewhere classified: Secondary | ICD-10-CM | POA: Diagnosis not present

## 2022-06-03 DIAGNOSIS — M545 Low back pain, unspecified: Secondary | ICD-10-CM | POA: Diagnosis not present

## 2022-06-03 DIAGNOSIS — M2569 Stiffness of other specified joint, not elsewhere classified: Secondary | ICD-10-CM | POA: Diagnosis not present

## 2022-06-03 DIAGNOSIS — M25651 Stiffness of right hip, not elsewhere classified: Secondary | ICD-10-CM | POA: Diagnosis not present

## 2022-06-05 ENCOUNTER — Other Ambulatory Visit: Payer: Self-pay | Admitting: Family Medicine

## 2022-06-08 NOTE — Progress Notes (Unsigned)
Chief Complaint:   OBESITY Shannon Bond is here to discuss her progress with her obesity treatment plan along with follow-up of her obesity related diagnoses. Shannon Bond is on the Category 2 plan and keeping a food journal with goal of 1200 calories and 80 grams of protein daily and states she is following her eating plan approximately 98% of the time. Shannon Bond states she is walking for 45 minutes 4 times per week and going to physical therapy for 60 minutes 2 times per week.  Today's visit was #: 72 Starting weight: 183 lbs Starting date: 06/24/19 Today's weight: 150 lbs Today's date: 05/25/22 Total lbs lost to date: 33 Total lbs lost since last in-office visit: 0  Interim History: Her weight remains the same as her last visit.  Her stress level is up and she is struggling to maintain her weight.  Subjective:   1. Primary hypertension Well-controlled.  Taking HCTZ and lisinopril.  2. Mixed dyslipidemia Taking Crestor.   Assessment/Plan:   1. Primary hypertension Continue medications.  2. Mixed dyslipidemia 1.  Continue Crestor. 2.  No trans fats.  3. Generalized obesity, BMI 27.0-27.9,adult 1.  Meal planning 2.  Will continue to adhere closely to the plan.  Shannon Bond is currently in the action stage of change. As such, her goal is to continue with weight loss efforts. She has agreed to the Category 2 plan and keeping a food journal with goal of 1200 calories and 80 grams of protein daily.  Exercise goals:  as is  Behavioral modification strategies: increasing lean protein intake, decreasing simple carbohydrates, increasing vegetables, increasing water intake, decreasing eating out, no skipping meals, meal planning and cooking strategies, keeping healthy foods in the home, avoiding temptations, and planning for success.  Shannon Bond has agreed to follow-up with our clinic in 4 weeks. She was informed of the importance of frequent follow-up visits to maximize her success with intensive  lifestyle modifications for her multiple health conditions.  Objective:   Blood pressure 118/72, pulse 74, temperature 97.6 F (36.4 C), height 5' 2"$  (1.575 m), weight 150 lb (68 kg), SpO2 99 %. Body mass index is 27.44 kg/m.  General: Cooperative, alert, well developed, in no acute distress. HEENT: Conjunctivae and lids unremarkable. Cardiovascular: Regular rhythm.  Lungs: Normal work of breathing. Neurologic: No focal deficits.   Lab Results  Component Value Date   CREATININE 0.93 04/25/2022   BUN 40 (H) 04/25/2022   NA 140 04/25/2022   K 4.9 04/25/2022   CL 106 04/25/2022   CO2 25 04/25/2022   Lab Results  Component Value Date   ALT 16 04/25/2022   AST 20 04/25/2022   ALKPHOS 38 (L) 04/25/2022   BILITOT 0.5 04/25/2022   Lab Results  Component Value Date   HGBA1C 5.4 02/26/2021   HGBA1C 5.1 06/24/2019   Lab Results  Component Value Date   INSULIN 5.6 06/24/2019   Lab Results  Component Value Date   TSH 3.28 04/25/2022   Lab Results  Component Value Date   CHOL 113 04/25/2022   HDL 56.10 04/25/2022   LDLCALC 37 04/25/2022   TRIG 99.0 04/25/2022   CHOLHDL 2 04/25/2022   Lab Results  Component Value Date   VD25OH 67.28 05/05/2021   VD25OH 75.6 06/22/2020   VD25OH 89.6 03/16/2020   Lab Results  Component Value Date   WBC 6.4 04/25/2022   HGB 12.5 04/25/2022   HCT 37.5 04/25/2022   MCV 99.2 04/25/2022   PLT 287.0 04/25/2022  Lab Results  Component Value Date   IRON 84 05/14/2021   TIBC 285.6 05/14/2021   FERRITIN 92.1 05/14/2021    Attestation Statements:   Reviewed by clinician on day of visit: allergies, medications, problem list, medical history, surgical history, family history, social history, and previous encounter notes.  I, Dawn Whitmire, FNP-C, am acting as transcriptionist for Dr. Jearld Lesch.  I have reviewed the above documentation for accuracy and completeness, and I agree with the above. Jearld Lesch, DO

## 2022-06-22 ENCOUNTER — Encounter: Payer: Self-pay | Admitting: Bariatrics

## 2022-06-22 ENCOUNTER — Ambulatory Visit (INDEPENDENT_AMBULATORY_CARE_PROVIDER_SITE_OTHER): Payer: PPO | Admitting: Bariatrics

## 2022-06-22 VITALS — BP 108/65 | HR 76 | Temp 97.5°F | Ht 62.0 in | Wt 150.0 lb

## 2022-06-22 DIAGNOSIS — E7849 Other hyperlipidemia: Secondary | ICD-10-CM | POA: Diagnosis not present

## 2022-06-22 DIAGNOSIS — E669 Obesity, unspecified: Secondary | ICD-10-CM | POA: Diagnosis not present

## 2022-06-22 DIAGNOSIS — Z6827 Body mass index (BMI) 27.0-27.9, adult: Secondary | ICD-10-CM | POA: Diagnosis not present

## 2022-06-22 DIAGNOSIS — I10 Essential (primary) hypertension: Secondary | ICD-10-CM

## 2022-06-26 ENCOUNTER — Other Ambulatory Visit: Payer: Self-pay | Admitting: Family Medicine

## 2022-07-05 NOTE — Progress Notes (Signed)
Chief Complaint:   OBESITY Shannon Bond is here to discuss her progress with her obesity treatment plan along with follow-up of her obesity related diagnoses. Shannon Bond is on the Category 2 plan and keeping a food journal with goal of 1200 calories and 80 grams of protein daily and states she is following her eating plan approximately 97% of the time. Shannon Bond states she is walking and lifting weights for 60 minutes 7 times per week.  Today's visit was #: 102 Starting weight: 183 lbs Starting date: 06/24/19 Today's weight: 150 lbs Today's date: 06/22/22 Total lbs lost to date: 33 Total lbs lost since last in-office visit: 0  Interim History: Her weight is the same as previously.  She states that she needs to lose 5 pounds.  She has had to take more gabapentin.  Subjective:   1. Primary hypertension Stable.  Taking HCTZ and lisinopril.  2. Other hyperlipidemia Taking Crestor.  Assessment/Plan:   1. Primary hypertension 1.  Continue medications. 2.  No added salt 3.  Continue exercise.  2. Other hyperlipidemia 1.  Continue Crestor. 2.  Decrease all saturated fats.    3. Generalized obesity BMI 27.0-27.9,adult 1.  Will continue to adhere closely to the plan-90-100%. 2.  Keep water, fiber, and protein high.  Shannon Bond is currently in the action stage of change. As such, her goal is to continue with weight loss efforts. She has agreed to the Category 2 plan and low carbohydrate plan.  Follow low-carb for limited time.  Exercise goals: Increase weight.  08/23/08 pounds.  Behavioral modification strategies: increasing lean protein intake, decreasing simple carbohydrates, increasing vegetables, increasing water intake, decreasing eating out, no skipping meals, meal planning and cooking strategies, keeping healthy foods in the home, and planning for success.  Shannon Bond has agreed to follow-up with our clinic in 2 weeks. She was informed of the importance of frequent follow-up visits to maximize  her success with intensive lifestyle modifications for her multiple health conditions.   Objective:   Blood pressure 108/65, pulse 76, temperature (!) 97.5 F (36.4 C), height 5\' 2"  (1.575 m), weight 150 lb (68 kg), SpO2 99 %. Body mass index is 27.44 kg/m.  General: Cooperative, alert, well developed, in no acute distress. HEENT: Conjunctivae and lids unremarkable. Cardiovascular: Regular rhythm.  Lungs: Normal work of breathing. Neurologic: No focal deficits.   Lab Results  Component Value Date   CREATININE 0.93 04/25/2022   BUN 40 (H) 04/25/2022   NA 140 04/25/2022   K 4.9 04/25/2022   CL 106 04/25/2022   CO2 25 04/25/2022   Lab Results  Component Value Date   ALT 16 04/25/2022   AST 20 04/25/2022   ALKPHOS 38 (L) 04/25/2022   BILITOT 0.5 04/25/2022   Lab Results  Component Value Date   HGBA1C 5.4 02/26/2021   HGBA1C 5.1 06/24/2019   Lab Results  Component Value Date   INSULIN 5.6 06/24/2019   Lab Results  Component Value Date   TSH 3.28 04/25/2022   Lab Results  Component Value Date   CHOL 113 04/25/2022   HDL 56.10 04/25/2022   LDLCALC 37 04/25/2022   TRIG 99.0 04/25/2022   CHOLHDL 2 04/25/2022   Lab Results  Component Value Date   VD25OH 67.28 05/05/2021   VD25OH 75.6 06/22/2020   VD25OH 89.6 03/16/2020   Lab Results  Component Value Date   WBC 6.4 04/25/2022   HGB 12.5 04/25/2022   HCT 37.5 04/25/2022   MCV 99.2 04/25/2022  PLT 287.0 04/25/2022   Lab Results  Component Value Date   IRON 84 05/14/2021   TIBC 285.6 05/14/2021   FERRITIN 92.1 05/14/2021    Attestation Statements:   Reviewed by clinician on day of visit: allergies, medications, problem list, medical history, surgical history, family history, social history, and previous encounter notes.  I, Dawn Whitmire, FNP-C, am acting as transcriptionist for Dr. Jearld Lesch.  I have reviewed the above documentation for accuracy and completeness, and I agree with the above. Jearld Lesch, DO

## 2022-07-11 ENCOUNTER — Encounter: Payer: Self-pay | Admitting: Bariatrics

## 2022-07-20 ENCOUNTER — Encounter: Payer: Self-pay | Admitting: Bariatrics

## 2022-07-20 ENCOUNTER — Ambulatory Visit (INDEPENDENT_AMBULATORY_CARE_PROVIDER_SITE_OTHER): Payer: PPO | Admitting: Bariatrics

## 2022-07-20 VITALS — BP 125/70 | HR 72 | Temp 97.8°F | Ht 62.0 in | Wt 152.0 lb

## 2022-07-20 DIAGNOSIS — E669 Obesity, unspecified: Secondary | ICD-10-CM | POA: Diagnosis not present

## 2022-07-20 DIAGNOSIS — Z6827 Body mass index (BMI) 27.0-27.9, adult: Secondary | ICD-10-CM | POA: Diagnosis not present

## 2022-07-20 DIAGNOSIS — E038 Other specified hypothyroidism: Secondary | ICD-10-CM | POA: Diagnosis not present

## 2022-07-21 ENCOUNTER — Encounter: Payer: Self-pay | Admitting: Bariatrics

## 2022-07-21 NOTE — Progress Notes (Signed)
Chief Complaint:   OBESITY Shannon Bond is here to discuss her progress with her obesity treatment plan along with follow-up of her obesity related diagnoses. Shannon Bond is on the Category 2 Plan and states she is following her eating plan approximately 98% of the time. Shannon Bond states she is walking and stretching 60 minutes 7 times per week.  Today's visit was #: 31 Starting weight: 183 LBS Starting date: 06/24/2019 Today's weight: 152 LBS Today's date: 07/20/2022 Total lbs lost to date: 31 LBS Total lbs lost since last in-office visit: +2 LBS  Interim History: Patient is 2 LBS from her last visit.  She is 3 LBS of water per the bioimpedance.  Subjective:   1. Other specified hypothyroidism Patient is taking Synthroid.  Assessment/Plan:   1. Other specified hypothyroidism Continue Synthroid.  Keep exercise level high.  Dietary supplement blend.  2. Generalized obesity  3. BMI 27.0-27.9,adult Shannon Bond is currently in the action stage of change. As such, her goal is to continue with weight loss efforts. She has agreed to the Category 2 Plan and following a lower carbohydrate, vegetable and lean protein rich diet plan.   1.  Meal planning. 2.  Intentional eating. 3.  Continue table tally.  Exercise goals:  Continue exercising.  Behavioral modification strategies: increasing lean protein intake, decreasing simple carbohydrates, increasing vegetables, increasing water intake, decreasing eating out, no skipping meals, meal planning and cooking strategies, keeping healthy foods in the home, and planning for success.  Shannon Bond has agreed to follow-up with our clinic in 4 weeks. She was informed of the importance of frequent follow-up visits to maximize her success with intensive lifestyle modifications for her multiple health conditions.   Objective:   Blood pressure 125/70, pulse 72, temperature 97.8 F (36.6 C), height 5\' 2"  (1.575 m), weight 152 lb (68.9 kg), SpO2 98 %. Body mass index  is 27.8 kg/m.  General: Cooperative, alert, well developed, in no acute distress. HEENT: Conjunctivae and lids unremarkable. Cardiovascular: Regular rhythm.  Lungs: Normal work of breathing. Neurologic: No focal deficits.   Lab Results  Component Value Date   CREATININE 0.93 04/25/2022   BUN 40 (H) 04/25/2022   NA 140 04/25/2022   K 4.9 04/25/2022   CL 106 04/25/2022   CO2 25 04/25/2022   Lab Results  Component Value Date   ALT 16 04/25/2022   AST 20 04/25/2022   ALKPHOS 38 (L) 04/25/2022   BILITOT 0.5 04/25/2022   Lab Results  Component Value Date   HGBA1C 5.4 02/26/2021   HGBA1C 5.1 06/24/2019   Lab Results  Component Value Date   INSULIN 5.6 06/24/2019   Lab Results  Component Value Date   TSH 3.28 04/25/2022   Lab Results  Component Value Date   CHOL 113 04/25/2022   HDL 56.10 04/25/2022   LDLCALC 37 04/25/2022   TRIG 99.0 04/25/2022   CHOLHDL 2 04/25/2022   Lab Results  Component Value Date   VD25OH 67.28 05/05/2021   VD25OH 75.6 06/22/2020   VD25OH 89.6 03/16/2020   Lab Results  Component Value Date   WBC 6.4 04/25/2022   HGB 12.5 04/25/2022   HCT 37.5 04/25/2022   MCV 99.2 04/25/2022   PLT 287.0 04/25/2022   Lab Results  Component Value Date   IRON 84 05/14/2021   TIBC 285.6 05/14/2021   FERRITIN 92.1 05/14/2021   Attestation Statements:   Reviewed by clinician on day of visit: allergies, medications, problem list, medical history, surgical history, family history,  social history, and previous encounter notes.  Delfina Redwood, am acting as Location manager for [employer], DO.  I have reviewed the above documentation for accuracy and completeness, and I agree with the above. Jearld Lesch, DO

## 2022-08-16 DIAGNOSIS — M1812 Unilateral primary osteoarthritis of first carpometacarpal joint, left hand: Secondary | ICD-10-CM | POA: Diagnosis not present

## 2022-08-16 DIAGNOSIS — M65312 Trigger thumb, left thumb: Secondary | ICD-10-CM | POA: Diagnosis not present

## 2022-08-18 ENCOUNTER — Ambulatory Visit (INDEPENDENT_AMBULATORY_CARE_PROVIDER_SITE_OTHER): Payer: PPO | Admitting: Bariatrics

## 2022-08-18 ENCOUNTER — Encounter: Payer: Self-pay | Admitting: Bariatrics

## 2022-08-18 VITALS — BP 119/68 | HR 76 | Temp 97.6°F | Ht 62.0 in | Wt 150.0 lb

## 2022-08-18 DIAGNOSIS — Z6827 Body mass index (BMI) 27.0-27.9, adult: Secondary | ICD-10-CM | POA: Diagnosis not present

## 2022-08-18 DIAGNOSIS — E782 Mixed hyperlipidemia: Secondary | ICD-10-CM

## 2022-08-18 DIAGNOSIS — E669 Obesity, unspecified: Secondary | ICD-10-CM

## 2022-08-23 NOTE — Progress Notes (Signed)
Chief Complaint:   OBESITY Shannon Bond is here to discuss her progress with her obesity treatment plan along with follow-up of her obesity related diagnoses. Shannon Bond is on the Category 2 Plan and following a lower carbohydrate, vegetable and lean protein rich diet plan and states she is following her eating plan approximately 98% of the time. Shannon Bond states she is doing yoga and walking for 60 minutes 7 times per week.  Today's visit was #: 45 Starting weight: 183 lbs Starting date: 06/24/2019 Today's weight: 150 lbs Today's date: 08/18/2022 Total lbs lost to date: 33 Total lbs lost since last in-office visit: 2  Interim History: Shannon Bond is down another 2 lbs since her last visit.   Subjective:   1. Mixed dyslipidemia Shannon Bond is taking Crestor.   Assessment/Plan:   1. Mixed dyslipidemia Shannon Bond will continue Crestor. She will increase MUFA's and PUFA's, and eliminate trans fats.   2. Generalized obesity  3. BMI 27.0-27.9,adult Shannon Bond is currently in the action stage of change. As such, her goal is to continue with weight loss efforts. She has agreed to following a lower carbohydrate, vegetable and lean protein rich diet plan.   Meal planning was discussed.   Exercise goals: As is.   Behavioral modification strategies: increasing lean protein intake, decreasing simple carbohydrates, increasing vegetables, increasing water intake, decreasing eating out, no skipping meals, meal planning and cooking strategies, keeping healthy foods in the home, and planning for success.  Shannon Bond has agreed to follow-up with our clinic in 4 weeks. She was informed of the importance of frequent follow-up visits to maximize her success with intensive lifestyle modifications for her multiple health conditions.   Objective:   Blood pressure 119/68, pulse 76, temperature 97.6 F (36.4 C), height 5\' 2"  (1.575 m), weight 150 lb (68 kg), SpO2 99 %. Body mass index is 27.44 kg/m.  General: Cooperative,  alert, well developed, in no acute distress. HEENT: Conjunctivae and lids unremarkable. Cardiovascular: Regular rhythm.  Lungs: Normal work of breathing. Neurologic: No focal deficits.   Lab Results  Component Value Date   CREATININE 0.93 04/25/2022   BUN 40 (H) 04/25/2022   NA 140 04/25/2022   K 4.9 04/25/2022   CL 106 04/25/2022   CO2 25 04/25/2022   Lab Results  Component Value Date   ALT 16 04/25/2022   AST 20 04/25/2022   ALKPHOS 38 (L) 04/25/2022   BILITOT 0.5 04/25/2022   Lab Results  Component Value Date   HGBA1C 5.4 02/26/2021   HGBA1C 5.1 06/24/2019   Lab Results  Component Value Date   INSULIN 5.6 06/24/2019   Lab Results  Component Value Date   TSH 3.28 04/25/2022   Lab Results  Component Value Date   CHOL 113 04/25/2022   HDL 56.10 04/25/2022   LDLCALC 37 04/25/2022   TRIG 99.0 04/25/2022   CHOLHDL 2 04/25/2022   Lab Results  Component Value Date   VD25OH 67.28 05/05/2021   VD25OH 75.6 06/22/2020   VD25OH 89.6 03/16/2020   Lab Results  Component Value Date   WBC 6.4 04/25/2022   HGB 12.5 04/25/2022   HCT 37.5 04/25/2022   MCV 99.2 04/25/2022   PLT 287.0 04/25/2022   Lab Results  Component Value Date   IRON 84 05/14/2021   TIBC 285.6 05/14/2021   FERRITIN 92.1 05/14/2021   Attestation Statements:   Reviewed by clinician on day of visit: allergies, medications, problem list, medical history, surgical history, family history, social history, and previous encounter  notes.   I, Burt Knack, am acting as transcriptionist for [employer], DO.  I have reviewed the above documentation for accuracy and completeness, and I agree with the above. Corinna Capra, DO

## 2022-08-24 ENCOUNTER — Encounter: Payer: Self-pay | Admitting: Bariatrics

## 2022-08-25 ENCOUNTER — Other Ambulatory Visit: Payer: Self-pay | Admitting: Family Medicine

## 2022-09-15 ENCOUNTER — Ambulatory Visit (INDEPENDENT_AMBULATORY_CARE_PROVIDER_SITE_OTHER): Payer: PPO | Admitting: Bariatrics

## 2022-09-15 ENCOUNTER — Encounter: Payer: Self-pay | Admitting: Bariatrics

## 2022-09-15 VITALS — BP 137/69 | HR 79 | Temp 97.6°F | Ht 62.0 in | Wt 149.0 lb

## 2022-09-15 DIAGNOSIS — E782 Mixed hyperlipidemia: Secondary | ICD-10-CM

## 2022-09-15 DIAGNOSIS — Z6827 Body mass index (BMI) 27.0-27.9, adult: Secondary | ICD-10-CM

## 2022-09-15 DIAGNOSIS — E669 Obesity, unspecified: Secondary | ICD-10-CM | POA: Diagnosis not present

## 2022-09-15 NOTE — Progress Notes (Signed)
   WEIGHT SUMMARY AND BIOMETRICS  Weight Lost Since Last Visit: 1lb   Vitals Temp: 97.6 F (36.4 C) BP: 137/69 Pulse Rate: 79 SpO2: 98 %   Anthropometric Measurements Height: 5\' 2"  (1.575 m) Weight: 149 lb (67.6 kg) BMI (Calculated): 27.25 Weight at Last Visit: 150lb Weight Lost Since Last Visit: 1lb Starting Weight: 183lb Total Weight Loss (lbs): 34 lb (15.4 kg)   Body Composition  Body Fat %: 42.1 % Fat Mass (lbs): 62.8 lbs Muscle Mass (lbs): 82.2 lbs Total Body Water (lbs): 66.8 lbs Visceral Fat Rating : 12   Other Clinical Data Fasting: no Labs: no Today's Visit #: 39 Starting Date: 06/24/19    OBESITY Shannon Bond is here to discuss her progress with her obesity treatment plan along with follow-up of her obesity related diagnoses.     Nutrition Plan: Other Atkins  - 80% adherence.  Current exercise: weightlifting and walking. She also doing yoga and strength training.  Interim History:  She is down 1 lb since her last visit.  Eating all of the food on the plan., Protein intake is as prescribed, and Water intake is adequate.  Pharmacotherapy:  Hunger is moderately controlled.  Cravings are moderately controlled.  Assessment/Plan:   Mixed dyslipidemia: LDL is at goal. Cardiovascular risk factors: advanced age (older than 4 for men, 52 for women), dyslipidemia, and obesity (BMI >= 30 kg/m2)  Lab Results  Component Value Date   CHOL 113 04/25/2022   HDL 56.10 04/25/2022   LDLCALC 37 04/25/2022   TRIG 99.0 04/25/2022   CHOLHDL 2 04/25/2022   Lab Results  Component Value Date   ALT 16 04/25/2022   AST 20 04/25/2022   ALKPHOS 38 (L) 04/25/2022   BILITOT 0.5 04/25/2022   The ASCVD Risk score (Arnett DK, et al., 2019) failed to calculate for the following reasons:   The 2019 ASCVD risk score is only valid for ages 27 to 86  Plan:  Continue statin.  Information sheet on healthy vs unhealthy fats.  Will avoid all trans fats.  Will  read labels Will minimize saturated fats except the following: low fat meats in moderation, diary, and limited dark chocolate.  Increase Omega 3 in foods, and consider an Omega 3 supplement.     Generalized Obesity: Current BMI BMI (Calculated): 27.25     Shannon Bond is currently in the action stage of change. As such, her goal is to continue with weight loss efforts.  She has agreed to low carbohydrate plan (Dr. Danny Lawless, "[place] Lips for You").   Exercise goals: Older adults should determine their level of effort for physical activity relative to their level of fitness.   Behavioral modification strategies: meal planning , increasing fiber rich foods, and keep healthy foods in the home.  Shannon Bond has agreed to follow-up with our clinic in 4 weeks.      Objective:   VITALS: Per patient if applicable, see vitals. GENERAL: Alert and in no acute distress. CARDIOPULMONARY: No increased WOB. Speaking in clear sentences.  PSYCH: Pleasant and cooperative. Speech normal rate and rhythm. Affect is appropriate. Insight and judgement are appropriate. Attention is focused, linear, and appropriate.  NEURO: Oriented as arrived to appointment on time with no prompting.   Attestation Statements:    This was prepared with the assistance of Engineer, civil (consulting).  Occasional wrong-word or sound-a-like substitutions may have occurred due to the inherent limitations of voice recognition software.   Corinna Capra, DO

## 2022-09-22 ENCOUNTER — Other Ambulatory Visit: Payer: Self-pay | Admitting: Family Medicine

## 2022-09-22 DIAGNOSIS — I10 Essential (primary) hypertension: Secondary | ICD-10-CM

## 2022-10-19 ENCOUNTER — Other Ambulatory Visit: Payer: Self-pay | Admitting: Family Medicine

## 2022-10-19 ENCOUNTER — Ambulatory Visit (INDEPENDENT_AMBULATORY_CARE_PROVIDER_SITE_OTHER): Payer: PPO | Admitting: Bariatrics

## 2022-10-19 ENCOUNTER — Encounter: Payer: Self-pay | Admitting: Bariatrics

## 2022-10-19 VITALS — BP 114/68 | HR 80 | Temp 97.4°F | Ht 62.0 in | Wt 147.0 lb

## 2022-10-19 DIAGNOSIS — E669 Obesity, unspecified: Secondary | ICD-10-CM | POA: Diagnosis not present

## 2022-10-19 DIAGNOSIS — I10 Essential (primary) hypertension: Secondary | ICD-10-CM

## 2022-10-19 DIAGNOSIS — Z6826 Body mass index (BMI) 26.0-26.9, adult: Secondary | ICD-10-CM | POA: Diagnosis not present

## 2022-10-19 DIAGNOSIS — M810 Age-related osteoporosis without current pathological fracture: Secondary | ICD-10-CM

## 2022-10-19 NOTE — Progress Notes (Signed)
   WEIGHT SUMMARY AND BIOMETRICS  Weight Lost Since Last Visit: 2lb  Vitals Temp: (!) 97.4 F (36.3 C) BP: 114/68 Pulse Rate: 80 SpO2: 99 %   Anthropometric Measurements Height: 5\' 2"  (1.575 m) Weight: 147 lb (66.7 kg) BMI (Calculated): 26.88 Weight at Last Visit: 149lb Weight Lost Since Last Visit: 2lb Starting Weight: 183lb Total Weight Loss (lbs): 36 lb (16.3 kg)   Body Composition  Body Fat %: 42.3 % Fat Mass (lbs): 62.4 lbs Muscle Mass (lbs): 80.8 lbs Total Body Water (lbs): 68.8 lbs Visceral Fat Rating : 12   Other Clinical Data Fasting: no Labs: no Today's Visit #: 33 Starting Date: 06/24/19    OBESITY Shannon Bond is here to discuss her progress with her obesity treatment plan along with follow-up of her obesity related diagnoses.     Nutrition Plan: Other Atkins  - 100% adherence.  Current exercise: walking, yoga and strength training  Interim History:  She is down 2 lbs since her last visit.  Protein intake is as prescribed, Meeting calorie goals., and Water intake is adequate.  Hunger is moderately controlled.  Cravings are moderately controlled.  Assessment/Plan:   Hypertension Hypertension well controlled.  Medication(s): Hydrochlorothiazide 25 mg daily  and Lisinopril 30 mg.   BP Readings from Last 3 Encounters:  10/19/22 114/68  09/15/22 137/69  08/18/22 119/68   Lab Results  Component Value Date   CREATININE 0.93 04/25/2022   CREATININE 1.03 10/18/2021   CREATININE 1.00 05/05/2021   Lab Results  Component Value Date   GFR 57.98 (L) 04/25/2022   GFR 51.48 (L) 10/18/2021   GFR 53.51 (L) 05/05/2021    Plan: Continue all antihypertensives at current dosages. No added salt. Will keep sodium content to 1,500 mg or less per day.      Generalized Obesity: Current BMI BMI (Calculated): 26.88    Shannon Bond is currently in the action stage of change. As such, her goal is to continue with weight loss efforts.  She has agreed to  low carbohydrate plan.  Exercise goals: Older adults should determine their level of effort for physical activity relative to their level of fitness.   Behavioral modification strategies: increasing lean protein intake, no meal skipping, and increase water intake.  Shannon Bond has agreed to follow-up with our clinic in 4 weeks.    Objective:   VITALS: Per patient if applicable, see vitals. GENERAL: Alert and in no acute distress. CARDIOPULMONARY: No increased WOB. Speaking in clear sentences.  PSYCH: Pleasant and cooperative. Speech normal rate and rhythm. Affect is appropriate. Insight and judgement are appropriate. Attention is focused, linear, and appropriate.  NEURO: Oriented as arrived to appointment on time with no prompting.   Attestation Statements:    This was prepared with the assistance of Engineer, civil (consulting).  Occasional wrong-word or sound-a-like substitutions may have occurred due to the inherent limitations of voice recognition software.   Corinna Capra, DO

## 2022-10-24 ENCOUNTER — Encounter: Payer: PPO | Admitting: Family Medicine

## 2022-10-25 ENCOUNTER — Encounter: Payer: Self-pay | Admitting: Family Medicine

## 2022-10-25 ENCOUNTER — Ambulatory Visit (INDEPENDENT_AMBULATORY_CARE_PROVIDER_SITE_OTHER): Payer: PPO | Admitting: Family Medicine

## 2022-10-25 ENCOUNTER — Other Ambulatory Visit: Payer: Self-pay | Admitting: Family Medicine

## 2022-10-25 VITALS — BP 118/78 | HR 93 | Temp 97.9°F | Ht 62.0 in | Wt 151.2 lb

## 2022-10-25 DIAGNOSIS — I10 Essential (primary) hypertension: Secondary | ICD-10-CM

## 2022-10-25 DIAGNOSIS — D72818 Other decreased white blood cell count: Secondary | ICD-10-CM

## 2022-10-25 DIAGNOSIS — E038 Other specified hypothyroidism: Secondary | ICD-10-CM

## 2022-10-25 DIAGNOSIS — Z0000 Encounter for general adult medical examination without abnormal findings: Secondary | ICD-10-CM | POA: Diagnosis not present

## 2022-10-25 LAB — LIPID PANEL
Cholesterol: 126 mg/dL (ref 0–200)
HDL: 58.5 mg/dL (ref >39.00)
LDL Cholesterol: 35 mg/dL (ref 0–99)
NonHDL: 67.49
Total CHOL/HDL Ratio: 2
Triglycerides: 162 mg/dL — ABNORMAL HIGH (ref 0.0–149.0)
VLDL: 32.4 mg/dL (ref 0.0–40.0)

## 2022-10-25 LAB — TSH: TSH: 2.79 u[IU]/mL (ref 0.35–5.50)

## 2022-10-25 LAB — CBC
HCT: 35.6 % — ABNORMAL LOW (ref 36.0–46.0)
Hemoglobin: 11.8 g/dL — ABNORMAL LOW (ref 12.0–15.0)
MCHC: 33.3 g/dL (ref 30.0–36.0)
MCV: 100.9 fl — ABNORMAL HIGH (ref 78.0–100.0)
Platelets: 305 10*3/uL (ref 150.0–400.0)
RBC: 3.53 Mil/uL — ABNORMAL LOW (ref 3.87–5.11)
RDW: 14.3 % (ref 11.5–15.5)
WBC: 6.4 10*3/uL (ref 4.0–10.5)

## 2022-10-25 LAB — COMPREHENSIVE METABOLIC PANEL
ALT: 17 U/L (ref 0–35)
AST: 22 U/L (ref 0–37)
Albumin: 4.3 g/dL (ref 3.5–5.2)
Alkaline Phosphatase: 39 U/L (ref 39–117)
BUN: 60 mg/dL — ABNORMAL HIGH (ref 6–23)
CO2: 21 mEq/L (ref 19–32)
Calcium: 9.7 mg/dL (ref 8.4–10.5)
Chloride: 108 mEq/L (ref 96–112)
Creatinine, Ser: 1.14 mg/dL (ref 0.40–1.20)
GFR: 45.25 mL/min — ABNORMAL LOW (ref >60.00)
Glucose, Bld: 101 mg/dL — ABNORMAL HIGH (ref 70–99)
Potassium: 5 mEq/L (ref 3.5–5.1)
Sodium: 137 mEq/L (ref 135–145)
Total Bilirubin: 0.5 mg/dL (ref 0.2–1.2)
Total Protein: 6.6 g/dL (ref 6.0–8.3)

## 2022-10-25 NOTE — Patient Instructions (Signed)
Give us 2-3 business days to get the results of your labs back.   Keep the diet clean and stay active.  Let us know if you need anything. 

## 2022-10-25 NOTE — Progress Notes (Signed)
Chief Complaint  Patient presents with   Annual Exam     Well Woman Shannon Bond is here for a complete physical.   Her last physical was >1 year ago.  Current diet: in general, a "healthy" diet. Current exercise: HIIT, water aerobics. Weight is stable and she denies daytime fatigue. Seatbelt? Yes Advanced directive? Yes  Health Maintenance Shingrix- Yes DEXA- Yes Tetanus- Yes Pneumonia- Yes  Past Medical History:  Diagnosis Date   Alcohol abuse    Allergy    Alopecia    Aortic atherosclerosis (HCC) 03/16/2021   Noted 12/2020 abdominal CT   Arthritis    Cardiac murmur 07/02/2019   Celiac disease    Chronic laryngitis 07/22/2019   Chronic rhinitis 07/22/2019   Diverticulitis    DOE (dyspnea on exertion) 07/02/2019   Dysphagia 07/22/2019   Edema of both lower extremities    Essential hypertension 01/24/2018   GERD (gastroesophageal reflux disease)    History of malignant neoplasm of skin 03/08/2021   Hypertension    Hypothyroidism 01/24/2018   Laryngopharyngeal reflux 07/22/2019   Moderate aortic regurgitation 10/11/2019   Obesity (BMI 30-39.9) 04/01/2019   Osteoarthritis    Osteoporosis 04/01/2019   Preop cardiovascular exam 03/09/2021   Seborrheic dermatitis of scalp 03/08/2021   Sensorineural hearing loss 07/22/2019   Squamous cell carcinoma in situ 03/08/2021   Subjective tinnitus 07/22/2019   Thyroid disease    Thyroid nodule 07/22/2019     Past Surgical History:  Procedure Laterality Date   APPENDECTOMY  1954   CERVICAL CONE BIOPSY  1965 and 1994   COLONOSCOPY     2007 said 13 years ago. Said that she does the hemoocult cards test at home    ESOPHAGOGASTRODUODENOSCOPY     2014   REPLACEMENT TOTAL KNEE BILATERAL  2003    Medications  Current Outpatient Medications on File Prior to Visit  Medication Sig Dispense Refill   acetaminophen (TYLENOL) 325 MG tablet Take 650 mg by mouth every 6 (six) hours as needed for headache.     alendronate  (FOSAMAX) 70 MG tablet TAKE 1 TABLET BY MOUTH ONCE A WEEK. TAKE WITH A FULL GLASS OF WATER ON AN EMPTY STOMACH. 12 tablet 2   estradiol (ESTRACE) 0.1 MG/GM vaginal cream PLACE 1 APPLICATORFUL VAGINALLY AT BEDTIME. 42.5 g 1   Famotidine (PEPCID AC PO) Take by mouth.     hydrochlorothiazide (HYDRODIURIL) 25 MG tablet TAKE 1 TABLET BY MOUTH EVERY DAY 90 tablet 1   ipratropium (ATROVENT) 0.06 % nasal spray Place 2 sprays into both nostrils 3 (three) times daily. 105 mL 3   lactobacillus acidophilus (BACID) TABS tablet Take 2 tablets by mouth daily.     levothyroxine (SYNTHROID) 112 MCG tablet TAKE 1 TABLET BY MOUTH DAILY BEFORE BREAKFAST. 90 tablet 1   lisinopril (ZESTRIL) 30 MG tablet TAKE 1 TABLET BY MOUTH EVERY DAY 90 tablet 0   Melatonin-Pyridoxine (MELATIN) 3-1 MG TABS Take 1 tablet by mouth at bedtime.     Multiple Vitamin (MULTIVITAMIN WITH MINERALS) TABS tablet Take 1 tablet by mouth daily.     PAPAYA ENZYME PO Take 1 capsule by mouth daily.     rosuvastatin (CRESTOR) 20 MG tablet TAKE 1 TABLET BY MOUTH EVERY DAY 90 tablet 0   S-Adenosylmethionine (SAM-E) 400 MG TABS Take 400 mg by mouth 2 (two) times daily.     Triamcinolone Acetonide (NASACORT ALLERGY 24HR NA) Place 1-2 sprays into the nose as needed for allergies or rhinitis.  Allergies Allergies  Allergen Reactions   Bactrim [Sulfamethoxazole-Trimethoprim]     Nausea, Vomiting, Abdominal Pain   Chlorine    Latex    Macrobid [Nitrofurantoin]     Diarrhea   Nsaids     Said it upset her stomach really bad    Penicillins     Review of Systems: Constitutional:  no fevers Eye:  no recent significant change in vision Ears:  No changes in hearing Nose/Mouth/Throat:  no complaints of nasal congestion, no sore throat Cardiovascular: no chest pain Respiratory:  No shortness of breath Gastrointestinal:  No change in bowel habits GU:  Female: negative for dysuria Integumentary:  no abnormal skin lesions reported Neurologic:  no  headaches Endocrine:  denies unexplained weight changes  Exam BP 118/78 (BP Location: Right Arm, Patient Position: Sitting, Cuff Size: Normal)   Pulse 93   Temp 97.9 F (36.6 C) (Oral)   Ht 5\' 2"  (1.575 m)   Wt 151 lb 4 oz (68.6 kg)   SpO2 99%   BMI 27.66 kg/m  General:  well developed, well nourished, in no apparent distress Skin:  no significant moles, warts, or growths Head:  no masses, lesions, or tenderness Eyes:  pupils equal and round, sclera anicteric without injection Ears:  canals without lesions, TMs shiny without retraction, no obvious effusion, no erythema Nose:  nares patent, mucosa normal, and no drainage Throat/Pharynx:  lips and gingiva without lesion; tongue and uvula midline; non-inflamed pharynx; no exudates or postnasal drainage Neck: neck supple without adenopathy, thyromegaly, or masses Lungs:  clear to auscultation, breath sounds equal bilaterally, no respiratory distress Cardio:  regular rate and rhythm, no bruits or LE edema Abdomen:  abdomen soft, nontender; bowel sounds normal; no masses or organomegaly Genital: Deferred Neuro:  gait normal; deep tendon reflexes normal and symmetric Psych: well oriented with normal range of affect and appropriate judgment/insight  Assessment and Plan  Well adult exam  Essential hypertension - Plan: Comprehensive metabolic panel, Lipid panel, CBC  Other specified hypothyroidism - Plan: TSH   Well 82 y.o. female. Counseled on diet and exercise. Other orders as above. Follow up in 6 mo. The patient voiced understanding and agreement to the plan.  Jilda Roche [place], DO 10/25/22 11:04 AM

## 2022-10-26 ENCOUNTER — Ambulatory Visit (INDEPENDENT_AMBULATORY_CARE_PROVIDER_SITE_OTHER): Payer: PPO

## 2022-10-26 DIAGNOSIS — I10 Essential (primary) hypertension: Secondary | ICD-10-CM | POA: Diagnosis not present

## 2022-10-26 LAB — FOLATE: Folate: >23.8 ng/mL (ref >5.9)

## 2022-10-28 ENCOUNTER — Other Ambulatory Visit: Payer: Self-pay | Admitting: Family Medicine

## 2022-10-28 ENCOUNTER — Encounter: Payer: Self-pay | Admitting: Family Medicine

## 2022-10-28 MED ORDER — GABAPENTIN 100 MG PO CAPS: 100.0000 mg | ORAL_CAPSULE | Freq: Every day | ORAL | 3 refills | Status: DC

## 2022-11-01 ENCOUNTER — Other Ambulatory Visit (INDEPENDENT_AMBULATORY_CARE_PROVIDER_SITE_OTHER): Payer: PPO

## 2022-11-01 DIAGNOSIS — D72818 Other decreased white blood cell count: Secondary | ICD-10-CM

## 2022-11-01 LAB — CBC
MPV: 9.7 fL (ref 7.5–12.5)
RBC: 3.37 10*6/uL — ABNORMAL LOW (ref 3.80–5.10)

## 2022-11-01 NOTE — Addendum Note (Signed)
Addended by: Mervin Kung A on: 11/01/2022 10:12 AM   Modules accepted: Orders

## 2022-11-02 ENCOUNTER — Other Ambulatory Visit: Payer: Self-pay | Admitting: Family Medicine

## 2022-11-02 DIAGNOSIS — E538 Deficiency of other specified B group vitamins: Secondary | ICD-10-CM

## 2022-11-02 LAB — CBC
HCT: 34.2 % — ABNORMAL LOW (ref 35.0–45.0)
Hemoglobin: 11.3 g/dL — ABNORMAL LOW (ref 11.7–15.5)
MCH: 33.5 pg — ABNORMAL HIGH (ref 27.0–33.0)
MCHC: 33 g/dL (ref 32.0–36.0)
MCV: 101.5 fL — ABNORMAL HIGH (ref 80.0–100.0)
Platelets: 274 10*3/uL (ref 140–400)
RDW: 12.5 % (ref 11.0–15.0)
WBC: 6.6 10*3/uL (ref 3.8–10.8)

## 2022-11-02 LAB — PATHOLOGIST SMEAR REVIEW

## 2022-11-02 NOTE — Addendum Note (Signed)
Addended by: Scharlene Gloss B on: 11/02/2022 03:54 PM   Modules accepted: Orders

## 2022-11-07 ENCOUNTER — Encounter: Payer: Self-pay | Admitting: Family Medicine

## 2022-11-07 ENCOUNTER — Ambulatory Visit (HOSPITAL_BASED_OUTPATIENT_CLINIC_OR_DEPARTMENT_OTHER)
Admission: RE | Admit: 2022-11-07 | Discharge: 2022-11-07 | Disposition: A | Discharge status: Home / Self Care | Payer: PPO | Source: Ambulatory Visit | Attending: Family Medicine | Admitting: Family Medicine

## 2022-11-07 DIAGNOSIS — M810 Age-related osteoporosis without current pathological fracture: Secondary | ICD-10-CM | POA: Insufficient documentation

## 2022-11-08 ENCOUNTER — Encounter: Payer: Self-pay | Admitting: Family Medicine

## 2022-11-14 ENCOUNTER — Other Ambulatory Visit (INDEPENDENT_AMBULATORY_CARE_PROVIDER_SITE_OTHER): Payer: PPO

## 2022-11-14 ENCOUNTER — Encounter: Payer: Self-pay | Admitting: Family Medicine

## 2022-11-14 DIAGNOSIS — E538 Deficiency of other specified B group vitamins: Secondary | ICD-10-CM | POA: Diagnosis not present

## 2022-11-14 LAB — VITAMIN B12: Vitamin B-12: >1501 pg/mL — ABNORMAL HIGH (ref 211–911)

## 2022-11-15 ENCOUNTER — Other Ambulatory Visit: Payer: Self-pay | Admitting: Family Medicine

## 2022-11-15 ENCOUNTER — Encounter: Payer: Self-pay | Admitting: Family Medicine

## 2022-11-21 ENCOUNTER — Other Ambulatory Visit: Payer: Self-pay | Admitting: Family Medicine

## 2022-11-21 ENCOUNTER — Encounter: Payer: Self-pay | Admitting: Bariatrics

## 2022-11-21 ENCOUNTER — Ambulatory Visit: Payer: PPO | Admitting: Bariatrics

## 2022-11-21 VITALS — BP 129/72 | HR 81 | Temp 97.9°F | Ht 62.0 in | Wt 146.0 lb

## 2022-11-21 DIAGNOSIS — E7849 Other hyperlipidemia: Secondary | ICD-10-CM

## 2022-11-21 DIAGNOSIS — E785 Hyperlipidemia, unspecified: Secondary | ICD-10-CM

## 2022-11-21 DIAGNOSIS — E669 Obesity, unspecified: Secondary | ICD-10-CM

## 2022-11-21 DIAGNOSIS — Z6826 Body mass index (BMI) 26.0-26.9, adult: Secondary | ICD-10-CM

## 2022-11-21 NOTE — Progress Notes (Signed)
   WEIGHT SUMMARY AND BIOMETRICS  Weight Lost Since Last Visit: 1lb   Vitals Temp: 97.9 F (36.6 C) BP: 129/72 Pulse Rate: 81 SpO2: 98 %   Anthropometric Measurements Height: 5\' 2"  (1.575 m) Weight: 146 lb (66.2 kg) BMI (Calculated): 26.7 Weight at Last Visit: 147lb Weight Lost Since Last Visit: 1lb Starting Weight: 183lb Total Weight Loss (lbs): 37 lb (16.8 kg)   Body Composition  Body Fat %: 41.9 % Fat Mass (lbs): 61.4 lbs Muscle Mass (lbs): 80.6 lbs Total Body Water (lbs): 64.8 lbs Visceral Fat Rating : 12   Other Clinical Data Fasting: no Labs: no Today's Visit #: 48 Starting Date: 06/24/19    OBESITY Shannon Bond is here to discuss her progress with her obesity treatment plan along with follow-up of her obesity related diagnoses.     Nutrition Plan: low carbohydrate plan - 100% adherence.  Current exercise: cardiovascular workout on exercise equipment, walking, and weightlifting  Interim History:  She is down 1 lb since her last visit.  Eating all of the food on the plan., Is skipping meals, and Water intake is adequate.  Hunger is well controlled.  Cravings are well controlled.  Assessment/Plan:   Hyperlipidemia LDL is at goal. Medication(s): Crestor Cardiovascular risk factors: advanced age (older than 55 for men, 65 for women), dyslipidemia, and obesity (BMI >= 30 kg/m2)  Lab Results  Component Value Date   CHOL 126 10/25/2022   HDL 58.50 10/25/2022   LDLCALC 35 10/25/2022   TRIG 162.0 (H) 10/25/2022   CHOLHDL 2 10/25/2022   Lab Results  Component Value Date   ALT 17 10/25/2022   AST 22 10/25/2022   ALKPHOS 39 10/25/2022   BILITOT 0.5 10/25/2022   The ASCVD Risk score (Arnett DK, et al., 2019) failed to calculate for the following reasons:   The 2019 ASCVD risk score is only valid for ages 75 to 61  Plan:  Continue statin.  Information sheet on healthy vs unhealthy fats.  Will avoid all trans fats.  Will read labels Will  minimize saturated fats except the following: low fat meats in moderation, diary, and limited dark chocolate.  Increase Omega 3 in foods, and consider an Omega 3 supplement.     Generalized Obesity: Current BMI BMI (Calculated): 26.7    Shannon Bond is currently in the action stage of change. As such, her goal is to maintain weight for now.  She has agreed to low carbohydrate plan.  Exercise goals: Older adults should determine their level of effort for physical activity relative to their level of fitness.   Behavioral modification strategies: increasing lean protein intake, decreasing simple carbohydrates , no meal skipping, and mindful eating.  Shannon Bond has agreed to follow-up with our clinic in 4 weeks.       Objective:   VITALS: Per patient if applicable, see vitals. GENERAL: Alert and in no acute distress. CARDIOPULMONARY: No increased WOB. Speaking in clear sentences.  PSYCH: Pleasant and cooperative. Speech normal rate and rhythm. Affect is appropriate. Insight and judgement are appropriate. Attention is focused, linear, and appropriate.  NEURO: Oriented as arrived to appointment on time with no prompting.   Attestation Statements:  This was prepared with the assistance of Engineer, civil (consulting).  Occasional wrong-word or sound-a-like substitutions may have occurred due to the inherent limitations of voice recognition software.   Corinna Capra, DO

## 2022-11-25 ENCOUNTER — Encounter: Payer: Self-pay | Admitting: Family Medicine

## 2022-11-25 ENCOUNTER — Ambulatory Visit (INDEPENDENT_AMBULATORY_CARE_PROVIDER_SITE_OTHER): Payer: PPO | Admitting: Family Medicine

## 2022-11-25 VITALS — BP 132/72 | HR 86 | Temp 98.1°F | Ht 62.5 in | Wt 154.4 lb

## 2022-11-25 DIAGNOSIS — L308 Other specified dermatitis: Secondary | ICD-10-CM | POA: Diagnosis not present

## 2022-11-25 DIAGNOSIS — I10 Essential (primary) hypertension: Secondary | ICD-10-CM | POA: Diagnosis not present

## 2022-11-25 MED ORDER — HYDROCHLOROTHIAZIDE 25 MG PO TABS
25.0000 mg | ORAL_TABLET | Freq: Two times a day (BID) | ORAL | 1 refills | Status: DC
Start: 2022-11-25 — End: 2023-01-03

## 2022-11-25 NOTE — Patient Instructions (Addendum)
Try not to scratch as this can make things worse. Avoid scented products while dealing with this. You may resume when the itchiness resolves. Cold/cool compresses can help.   Give [place] 2-3 business days to get the results of your labs back.   Keep the diet clean and stay active.  Let us know if you need anything.

## 2022-11-25 NOTE — Progress Notes (Signed)
Chief Complaint  Patient presents with   Follow-up    Subjective: Patient is a 82 y.o. female here for f/u.  Patient was started on 100-200 mg of gabapentin nightly to help with itching.  She reports compliance and no adverse effects.  She states that it did help with her itching.  No current rashes.  Does not have the need to use it during the day.  Hypertension Patient presents for hypertension follow up. She does monitor home blood pressures. Blood pressures ranging on average from 120-130's/80's. She is compliant with medications- hydrochlorothiazide 25 mg bid, lisinopril 30 mg/d. Patient has these side effects of medication: none; helping a lot w nocturia She is adhering to a healthy diet overall. Exercise: walking, wt resistance No CP or SOB.   Past Medical History:  Diagnosis Date   Alcohol abuse    Allergy    Alopecia    Aortic atherosclerosis (HCC) 03/16/2021   Noted 12/2020 abdominal CT   Arthritis    Cardiac murmur 07/02/2019   Celiac disease    Chronic laryngitis 07/22/2019   Chronic rhinitis 07/22/2019   Diverticulitis    DOE (dyspnea on exertion) 07/02/2019   Dysphagia 07/22/2019   Edema of both lower extremities    Essential hypertension 01/24/2018   GERD (gastroesophageal reflux disease)    History of malignant neoplasm of skin 03/08/2021   Hypertension    Hypothyroidism 01/24/2018   Laryngopharyngeal reflux 07/22/2019   Moderate aortic regurgitation 10/11/2019   Obesity (BMI 30-39.9) 04/01/2019   Osteoarthritis    Osteoporosis 04/01/2019   Preop cardiovascular exam 03/09/2021   Seborrheic dermatitis of scalp 03/08/2021   Sensorineural hearing loss 07/22/2019   Squamous cell carcinoma in situ 03/08/2021   Subjective tinnitus 07/22/2019   Thyroid disease    Thyroid nodule 07/22/2019    Objective: BP 132/72 (BP Location: Left Arm, Patient Position: Sitting, Cuff Size: Normal)   Pulse 86   Temp 98.1 F (36.7 C) (Oral)   Ht 5' 2.5" (1.588 m)    Wt 154 lb 6 oz (70 kg)   SpO2 98%   BMI 27.79 kg/m  General: Awake, appears stated age Heart: RRR, no LE edema Lungs: CTAB, no rales, wheezes or rhonchi. No accessory muscle use Psych: Age appropriate judgment and insight, normal affect and mood  Assessment and Plan: Pruritic dermatitis  Essential hypertension - Plan: hydrochlorothiazide (HYDRODIURIL) 25 MG tablet, Basic metabolic panel  Chronic, stable. Cont gabapentin 100-200 mg qhs prn. Avoid scratching. Focusing on decreasing BUN w decreasing protein. Chronic, stable. Cont lisinopril 30 mg/d, hydrochlorothiazide 25 mg bid. The latter has really helped her nocturia. Will cont. F/u as originally scheduled.  The patient voiced understanding and agreement to the plan.  Jilda Roche [place], DO 11/25/22  4:01 PM

## 2022-12-16 ENCOUNTER — Encounter: Payer: Self-pay | Admitting: Family Medicine

## 2022-12-16 ENCOUNTER — Ambulatory Visit: Payer: PPO | Admitting: Family Medicine

## 2022-12-16 VITALS — BP 120/78 | HR 97 | Temp 98.1°F | Ht 62.5 in | Wt 156.1 lb

## 2022-12-16 DIAGNOSIS — N3000 Acute cystitis without hematuria: Secondary | ICD-10-CM

## 2022-12-16 LAB — POC URINALSYSI DIPSTICK (AUTOMATED)
Bilirubin, UA: NEGATIVE
Blood, UA: NEGATIVE
Glucose, UA: NEGATIVE
Ketones, UA: NEGATIVE
Nitrite, UA: POSITIVE
Protein, UA: NEGATIVE
Spec Grav, UA: 1.01 (ref 1.010–1.025)
Urobilinogen, UA: 0.2 E.U./dL
pH, UA: 5 (ref 5.0–8.0)

## 2022-12-16 MED ORDER — CEPHALEXIN 500 MG PO CAPS
500.0000 mg | ORAL_CAPSULE | Freq: Three times a day (TID) | ORAL | 0 refills | Status: AC
Start: 2022-12-16 — End: 2022-12-23

## 2022-12-16 NOTE — Patient Instructions (Signed)
Stay hydrated.   Warning signs/symptoms: Uncontrollable nausea/vomiting, fevers, worsening symptoms despite treatment, confusion.  Give us around 2 business days to get culture back to you.  Let us know if you need anything. 

## 2022-12-16 NOTE — Progress Notes (Signed)
Chief Complaint  Patient presents with   Dysuria    Frequent urination    Shannon Bond is a 82 y.o. female here for possible UTI.  Duration: 2 days. Symptoms: Dysuria, urinary frequency and low back pain Denies: hematuria, urinary hesitancy, urinary retention, fever, nausea, vomiting, and urgency, vaginal discharge Hx of recurrent UTI? No Denies new sexual partners.  Past Medical History:  Diagnosis Date   Alcohol abuse    Allergy    Alopecia    Aortic atherosclerosis (HCC) 03/16/2021   Noted 12/2020 abdominal CT   Arthritis    Cardiac murmur 07/02/2019   Celiac disease    Chronic laryngitis 07/22/2019   Chronic rhinitis 07/22/2019   Diverticulitis    DOE (dyspnea on exertion) 07/02/2019   Dysphagia 07/22/2019   Edema of both lower extremities    Essential hypertension 01/24/2018   GERD (gastroesophageal reflux disease)    History of malignant neoplasm of skin 03/08/2021   Hypertension    Hypothyroidism 01/24/2018   Laryngopharyngeal reflux 07/22/2019   Moderate aortic regurgitation 10/11/2019   Obesity (BMI 30-39.9) 04/01/2019   Osteoarthritis    Osteoporosis 04/01/2019   Preop cardiovascular exam 03/09/2021   Seborrheic dermatitis of scalp 03/08/2021   Sensorineural hearing loss 07/22/2019   Squamous cell carcinoma in situ 03/08/2021   Subjective tinnitus 07/22/2019   Thyroid disease    Thyroid nodule 07/22/2019     BP 120/78 (BP Location: Left Arm, Patient Position: Sitting, Cuff Size: Normal)   Pulse 97   Temp 98.1 F (36.7 C) (Oral)   Ht 5' 2.5" (1.588 m)   Wt 156 lb 2 oz (70.8 kg)   SpO2 98%   BMI 28.10 kg/m  General: Awake, alert, appears stated age Heart: RRR Lungs: CTAB, normal respiratory effort, no accessory muscle usage Abd: BS+, soft, NT, ND, no masses or organomegaly MSK: No CVA tenderness, neg Lloyd's sign Psych: Age appropriate judgment and insight  Acute cystitis without hematuria - Plan: POCT Urinalysis Dipstick (Automated), Urine  Culture, cephALEXin (KEFLEX) 500 MG capsule  Stay hydrated. Seek immediate care if pt starts to develop fevers, new/worsening symptoms, uncontrollable N/V. F/u prn. The patient voiced understanding and agreement to the plan.  Jilda Roche [place], DO 12/16/22 1:53 PM

## 2022-12-19 ENCOUNTER — Other Ambulatory Visit: Payer: Self-pay | Admitting: Family Medicine

## 2022-12-19 DIAGNOSIS — I10 Essential (primary) hypertension: Secondary | ICD-10-CM

## 2022-12-19 LAB — URINE CULTURE
MICRO NUMBER:: 15405707
SPECIMEN QUALITY:: ADEQUATE

## 2022-12-21 ENCOUNTER — Ambulatory Visit (INDEPENDENT_AMBULATORY_CARE_PROVIDER_SITE_OTHER): Payer: PPO | Admitting: Bariatrics

## 2022-12-21 ENCOUNTER — Encounter: Payer: Self-pay | Admitting: Bariatrics

## 2022-12-21 VITALS — BP 125/66 | HR 73 | Temp 97.6°F | Ht 62.0 in | Wt 148.0 lb

## 2022-12-21 DIAGNOSIS — E669 Obesity, unspecified: Secondary | ICD-10-CM | POA: Diagnosis not present

## 2022-12-21 DIAGNOSIS — E7849 Other hyperlipidemia: Secondary | ICD-10-CM

## 2022-12-21 DIAGNOSIS — Z6827 Body mass index (BMI) 27.0-27.9, adult: Secondary | ICD-10-CM

## 2022-12-21 DIAGNOSIS — E785 Hyperlipidemia, unspecified: Secondary | ICD-10-CM

## 2022-12-21 NOTE — Progress Notes (Signed)
   WEIGHT SUMMARY AND BIOMETRICS  Weight Gained Since Last Visit: 2lb   Vitals Temp: 97.6 F (36.4 C) BP: 125/66 Pulse Rate: 73 SpO2: 100 %   Anthropometric Measurements Height: 5\' 2"  (1.575 m) Weight: 148 lb (67.1 kg) BMI (Calculated): 27.06 Weight at Last Visit: 146lb Weight Gained Since Last Visit: 2lb Starting Weight: 183lb Total Weight Loss (lbs): 35 lb (15.9 kg)   Body Composition  Body Fat %: 42.5 % Fat Mass (lbs): 63.2 lbs Muscle Mass (lbs): 81 lbs Total Body Water (lbs): 65.6 lbs Visceral Fat Rating : 12   Other Clinical Data Fasting: no Labs: no Today's Visit #: 8 Starting Date: 06/24/19    OBESITY Sandie is here to discuss her progress with her obesity treatment plan along with follow-up of her obesity related diagnoses.   Nutrition Plan: low carbohydrate plan  ( plant based )- 100% adherence.  Current exercise: swimming, walking, weightlifting, and yoga  Interim History:  She is up 2 lbs of which 1 lb is muscle gain and increased water.  Eating all of the food on the plan., Protein intake is as prescribed, Water intake is adequate., and Denies polyphagia   Hunger is moderately controlled.  Cravings are moderately controlled.  Assessment/Plan:   Hyperlipidemia LDL is at goal. Medication(s): Crestor Cardiovascular risk factors: advanced age (older than 34 for men, 48 for women), dyslipidemia, and history of obesity  Lab Results  Component Value Date   CHOL 126 10/25/2022   HDL 58.50 10/25/2022   LDLCALC 35 10/25/2022   TRIG 162.0 (H) 10/25/2022   CHOLHDL 2 10/25/2022   Lab Results  Component Value Date   ALT 17 10/25/2022   AST 22 10/25/2022   ALKPHOS 39 10/25/2022   BILITOT 0.5 10/25/2022   The ASCVD Risk score (Arnett DK, et al., 2019) failed to calculate for the following reasons:   The 2019 ASCVD risk score is only valid for ages 52 to 72  Plan:  Continue statin.  Will avoid all trans fats.  Will read  labels Will minimize saturated fats except the following: low fat meats in moderation, diary, and limited dark chocolate.  Discussed high, protein vegetarian sources.     Generalized Obesity: Current BMI BMI (Calculated): 27.06    Sherleen is currently in the action stage of change. As such, her goal is to continue with weight loss efforts.  She has agreed to low carbohydrate plan.  Exercise goals: Older adults should do exercises that maintain or improve balance if they are at risk of falling.   Behavioral modification strategies: increasing lean protein intake, meal planning , increase water intake, better snacking choices, and mindful eating.  Monet has agreed to follow-up with our clinic in 4 weeks.      Objective:   VITALS: Per patient if applicable, see vitals. GENERAL: Alert and in no acute distress. CARDIOPULMONARY: No increased WOB. Speaking in clear sentences.  PSYCH: Pleasant and cooperative. Speech normal rate and rhythm. Affect is appropriate. Insight and judgement are appropriate. Attention is focused, linear, and appropriate.  NEURO: Oriented as arrived to appointment on time with no prompting.   Attestation Statements:   This was prepared with the assistance of Engineer, civil (consulting).  Occasional wrong-word or sound-a-like substitutions may have occurred due to the inherent limitations of voice recognition software.   Corinna Capra, DO

## 2022-12-26 ENCOUNTER — Other Ambulatory Visit: Payer: Self-pay | Admitting: Family Medicine

## 2023-01-03 ENCOUNTER — Other Ambulatory Visit: Payer: Self-pay | Admitting: Family Medicine

## 2023-01-03 DIAGNOSIS — I10 Essential (primary) hypertension: Secondary | ICD-10-CM

## 2023-01-19 ENCOUNTER — Ambulatory Visit: Payer: PPO | Admitting: Bariatrics

## 2023-01-25 ENCOUNTER — Ambulatory Visit (INDEPENDENT_AMBULATORY_CARE_PROVIDER_SITE_OTHER): Payer: PPO | Admitting: Bariatrics

## 2023-01-25 ENCOUNTER — Encounter: Payer: Self-pay | Admitting: Bariatrics

## 2023-01-25 VITALS — BP 128/73 | HR 77 | Temp 97.5°F | Ht 62.0 in | Wt 145.0 lb

## 2023-01-25 DIAGNOSIS — Z6826 Body mass index (BMI) 26.0-26.9, adult: Secondary | ICD-10-CM

## 2023-01-25 DIAGNOSIS — E66811 Obesity, class 1: Secondary | ICD-10-CM

## 2023-01-25 DIAGNOSIS — I10 Essential (primary) hypertension: Secondary | ICD-10-CM | POA: Diagnosis not present

## 2023-01-25 DIAGNOSIS — E669 Obesity, unspecified: Secondary | ICD-10-CM | POA: Diagnosis not present

## 2023-01-25 NOTE — Progress Notes (Signed)
   WEIGHT SUMMARY AND BIOMETRICS  Weight Lost Since Last Visit: 3lb  Weight Gained Since Last Visit: 0   Vitals Temp: (!) 97.5 F (36.4 C) BP: 128/73 Pulse Rate: 77 SpO2: 99 %   Anthropometric Measurements Height: 5\' 2"  (1.575 m) Weight: 145 lb (65.8 kg) BMI (Calculated): 26.51 Weight at Last Visit: 148lb Weight Lost Since Last Visit: 3lb Weight Gained Since Last Visit: 0 Starting Weight: 183lb Total Weight Loss (lbs): 38 lb (17.2 kg)   Body Composition  Body Fat %: 40.9 % Fat Mass (lbs): 59.6 lbs Muscle Mass (lbs): 81.8 lbs Total Body Water (lbs): 63 lbs Visceral Fat Rating : 12   Other Clinical Data Fasting: no Labs: no Today's Visit #: 50 Starting Date: 06/24/19    OBESITY Tinea is here to discuss her progress with her obesity treatment plan along with follow-up of her obesity related diagnoses.     Nutrition Plan: low carbohydrate plan - 100% adherence.  Current exercise:  Walking, resistance and yoga  Interim History:  She is down 3 lbs since her last visit. She is trying to get 1,200 calories and 90 grams of protein.  Eating all of the food on the plan., Meeting calorie goals., and Water intake is adequate.  Hunger is moderately controlled.  Cravings are moderately controlled.  Assessment/Plan:   Hypertension Hypertension well controlled.  Medication(s): Hydrochlorothiazide 25 mg daily   BP Readings from Last 3 Encounters:  01/25/23 128/73  12/21/22 125/66  12/16/22 120/78   Lab Results  Component Value Date   CREATININE 1.11 (H) 11/25/2022   CREATININE 1.14 10/25/2022   CREATININE 0.93 04/25/2022   Lab Results  Component Value Date   GFR 45.25 (L) 10/25/2022   GFR 57.98 (L) 04/25/2022   GFR 51.48 (L) 10/18/2021    Plan: Continue all antihypertensives at current dosages. No added salt. Will keep sodium content to 1,500 mg or less per day.      Generalized Obesity: Current BMI BMI (Calculated): 26.51   Clarisse is  currently in the action stage of change. As such, her goal is to continue with weight loss efforts.  She has agreed to keeping a food journal with goal of 1,200 calories and 90 grams of protein daily.  Exercise goals: Older adults should do exercises that maintain or improve balance if they are at risk of falling.   Behavioral modification strategies: meal planning , increasing vegetables, and mindful eating.  Azaria has agreed to follow-up with our clinic in 4 weeks.      Objective:   VITALS: Per patient if applicable, see vitals. GENERAL: Alert and in no acute distress. CARDIOPULMONARY: No increased WOB. Speaking in clear sentences.  PSYCH: Pleasant and cooperative. Speech normal rate and rhythm. Affect is appropriate. Insight and judgement are appropriate. Attention is focused, linear, and appropriate.  NEURO: Oriented as arrived to appointment on time with no prompting.   Attestation Statements:    This was prepared with the assistance of Engineer, civil (consulting).  Occasional wrong-word or sound-a-like substitutions may have occurred due to the inherent limitations of voice recognition software.  Corinna Capra, DO

## 2023-02-17 ENCOUNTER — Other Ambulatory Visit: Payer: Self-pay | Admitting: Family Medicine

## 2023-02-27 DIAGNOSIS — D229 Melanocytic nevi, unspecified: Secondary | ICD-10-CM | POA: Diagnosis not present

## 2023-02-27 DIAGNOSIS — Z86018 Personal history of other benign neoplasm: Secondary | ICD-10-CM | POA: Diagnosis not present

## 2023-02-27 DIAGNOSIS — L821 Other seborrheic keratosis: Secondary | ICD-10-CM | POA: Diagnosis not present

## 2023-02-27 DIAGNOSIS — L570 Actinic keratosis: Secondary | ICD-10-CM | POA: Diagnosis not present

## 2023-02-27 DIAGNOSIS — Z85828 Personal history of other malignant neoplasm of skin: Secondary | ICD-10-CM | POA: Diagnosis not present

## 2023-02-27 DIAGNOSIS — L578 Other skin changes due to chronic exposure to nonionizing radiation: Secondary | ICD-10-CM | POA: Diagnosis not present

## 2023-02-27 DIAGNOSIS — L814 Other melanin hyperpigmentation: Secondary | ICD-10-CM | POA: Diagnosis not present

## 2023-03-01 ENCOUNTER — Encounter: Payer: Self-pay | Admitting: Bariatrics

## 2023-03-01 ENCOUNTER — Ambulatory Visit (INDEPENDENT_AMBULATORY_CARE_PROVIDER_SITE_OTHER): Payer: PPO | Admitting: Bariatrics

## 2023-03-01 VITALS — BP 132/74 | HR 76 | Temp 97.4°F | Ht 62.0 in | Wt 147.0 lb

## 2023-03-01 DIAGNOSIS — E669 Obesity, unspecified: Secondary | ICD-10-CM | POA: Diagnosis not present

## 2023-03-01 DIAGNOSIS — I10 Essential (primary) hypertension: Secondary | ICD-10-CM

## 2023-03-01 DIAGNOSIS — E6609 Other obesity due to excess calories: Secondary | ICD-10-CM

## 2023-03-01 DIAGNOSIS — Z6826 Body mass index (BMI) 26.0-26.9, adult: Secondary | ICD-10-CM

## 2023-03-01 NOTE — Progress Notes (Signed)
WEIGHT SUMMARY AND BIOMETRICS  Weight Lost Since Last Visit: 0  Weight Gained Since Last Visit: 2lb   Vitals Temp: (!) 97.4 F (36.3 C) BP: 132/74 Pulse Rate: 76 SpO2: 98 %   Anthropometric Measurements Height: 5\' 2"  (1.575 m) Weight: 147 lb (66.7 kg) BMI (Calculated): 26.88 Weight at Last Visit: 145lb Weight Lost Since Last Visit: 0 Weight Gained Since Last Visit: 2lb Starting Weight: 183lb Total Weight Loss (lbs): 36 lb (16.3 kg)   Body Composition  Body Fat %: 42 % Fat Mass (lbs): 62 lbs Muscle Mass (lbs): 81.2 lbs Total Body Water (lbs): 66 lbs Visceral Fat Rating : 12   Other Clinical Data Fasting: no Labs: no Today's Visit #: 81 Starting Date: 06/24/19    OBESITY Shannon Bond is here to discuss her progress with her obesity treatment plan along with follow-up of her obesity related diagnoses.    Nutrition Plan: low carbohydrate plan - 98% adherence.  Current exercise: walking, weightlifting, and yoga/stretches.  Interim History:  She is up 2 lbs since her last visit.  Eating all of the food on the plan., Protein intake is as prescribed, and Water intake is adequate.   Hunger is moderately controlled.  Cravings are moderately controlled.  Assessment/Plan:   Hypertension Hypertension well controlled.  Medication(s): Hydrochlorothiazide 25 mg daily , Lisinopril  30 mg   BP Readings from Last 3 Encounters:  03/01/23 132/74  01/25/23 128/73  12/21/22 125/66   Lab Results  Component Value Date   CREATININE 1.11 (H) 11/25/2022   CREATININE 1.14 10/25/2022   CREATININE 0.93 04/25/2022   Lab Results  Component Value Date   GFR 45.25 (L) 10/25/2022   GFR 57.98 (L) 04/25/2022   GFR 51.48 (L) 10/18/2021    Plan: Continue all antihypertensives at current dosages. No added salt. Will keep sodium content to 1,500 mg or less per day.      Generalized Obesity: Current BMI BMI (Calculated): 26.88    Christana is currently in the action stage of change. As such, her goal is to maintain weight for now.  She has agreed to low carbohydrate plan.  Exercise goals: Older adults should do exercises that maintain or improve balance if they are at risk of falling.   Behavioral modification strategies: increasing lean protein intake, no meal skipping, meal planning , decrease liquid calories, increase water intake, better snacking choices, planning for success, increasing vegetables, keep healthy foods in the home, and mindful eating.  Jenni has agreed to follow-up with our clinic in 4 weeks.      Objective:   VITALS: Per patient if applicable, see vitals. GENERAL: Alert and in no acute distress. CARDIOPULMONARY: No increased WOB. Speaking in clear sentences.  PSYCH: Pleasant and cooperative. Speech normal rate and rhythm. Affect is appropriate. Insight and judgement are appropriate. Attention is focused, linear, and appropriate.  NEURO: Oriented as  arrived to appointment on time with no prompting.   Attestation Statements:    This was prepared with the assistance of Engineer, civil (consulting).  Occasional wrong-word or sound-a-like substitutions may have occurred due to the inherent limitations of voice recognition   Shannon Capra, DO

## 2023-03-09 ENCOUNTER — Other Ambulatory Visit: Payer: Self-pay | Admitting: Family Medicine

## 2023-03-09 DIAGNOSIS — M791 Myalgia, unspecified site: Secondary | ICD-10-CM | POA: Diagnosis not present

## 2023-03-09 DIAGNOSIS — M545 Low back pain, unspecified: Secondary | ICD-10-CM | POA: Diagnosis not present

## 2023-03-09 DIAGNOSIS — M160 Bilateral primary osteoarthritis of hip: Secondary | ICD-10-CM | POA: Diagnosis not present

## 2023-03-15 DIAGNOSIS — M6281 Muscle weakness (generalized): Secondary | ICD-10-CM | POA: Diagnosis not present

## 2023-03-15 DIAGNOSIS — M25551 Pain in right hip: Secondary | ICD-10-CM | POA: Diagnosis not present

## 2023-03-15 DIAGNOSIS — M545 Low back pain, unspecified: Secondary | ICD-10-CM | POA: Diagnosis not present

## 2023-03-15 DIAGNOSIS — M25651 Stiffness of right hip, not elsewhere classified: Secondary | ICD-10-CM | POA: Diagnosis not present

## 2023-03-17 ENCOUNTER — Other Ambulatory Visit: Payer: Self-pay | Admitting: Family Medicine

## 2023-03-17 DIAGNOSIS — M545 Low back pain, unspecified: Secondary | ICD-10-CM | POA: Diagnosis not present

## 2023-03-17 DIAGNOSIS — M25551 Pain in right hip: Secondary | ICD-10-CM | POA: Diagnosis not present

## 2023-03-17 DIAGNOSIS — M6281 Muscle weakness (generalized): Secondary | ICD-10-CM | POA: Diagnosis not present

## 2023-03-17 DIAGNOSIS — I10 Essential (primary) hypertension: Secondary | ICD-10-CM

## 2023-03-17 DIAGNOSIS — M25651 Stiffness of right hip, not elsewhere classified: Secondary | ICD-10-CM | POA: Diagnosis not present

## 2023-03-21 DIAGNOSIS — M25551 Pain in right hip: Secondary | ICD-10-CM | POA: Diagnosis not present

## 2023-03-21 DIAGNOSIS — M25651 Stiffness of right hip, not elsewhere classified: Secondary | ICD-10-CM | POA: Diagnosis not present

## 2023-03-21 DIAGNOSIS — M6281 Muscle weakness (generalized): Secondary | ICD-10-CM | POA: Diagnosis not present

## 2023-03-21 DIAGNOSIS — M545 Low back pain, unspecified: Secondary | ICD-10-CM | POA: Diagnosis not present

## 2023-03-24 DIAGNOSIS — M6281 Muscle weakness (generalized): Secondary | ICD-10-CM | POA: Diagnosis not present

## 2023-03-24 DIAGNOSIS — M545 Low back pain, unspecified: Secondary | ICD-10-CM | POA: Diagnosis not present

## 2023-03-24 DIAGNOSIS — M25651 Stiffness of right hip, not elsewhere classified: Secondary | ICD-10-CM | POA: Diagnosis not present

## 2023-03-24 DIAGNOSIS — M25551 Pain in right hip: Secondary | ICD-10-CM | POA: Diagnosis not present

## 2023-03-27 DIAGNOSIS — M25651 Stiffness of right hip, not elsewhere classified: Secondary | ICD-10-CM | POA: Diagnosis not present

## 2023-03-27 DIAGNOSIS — M6281 Muscle weakness (generalized): Secondary | ICD-10-CM | POA: Diagnosis not present

## 2023-03-27 DIAGNOSIS — M545 Low back pain, unspecified: Secondary | ICD-10-CM | POA: Diagnosis not present

## 2023-03-27 DIAGNOSIS — M25551 Pain in right hip: Secondary | ICD-10-CM | POA: Diagnosis not present

## 2023-03-29 ENCOUNTER — Ambulatory Visit (INDEPENDENT_AMBULATORY_CARE_PROVIDER_SITE_OTHER): Payer: PPO | Admitting: Bariatrics

## 2023-03-29 ENCOUNTER — Encounter: Payer: Self-pay | Admitting: Bariatrics

## 2023-03-29 VITALS — BP 126/73 | HR 76 | Temp 97.4°F | Ht 62.0 in | Wt 146.0 lb

## 2023-03-29 DIAGNOSIS — E782 Mixed hyperlipidemia: Secondary | ICD-10-CM | POA: Diagnosis not present

## 2023-03-29 DIAGNOSIS — Z6826 Body mass index (BMI) 26.0-26.9, adult: Secondary | ICD-10-CM | POA: Diagnosis not present

## 2023-03-29 DIAGNOSIS — E669 Obesity, unspecified: Secondary | ICD-10-CM | POA: Diagnosis not present

## 2023-03-29 NOTE — Progress Notes (Signed)
WEIGHT SUMMARY AND BIOMETRICS  Weight Lost Since Last Visit: 1lb  Weight Gained Since Last Visit: 0   Vitals Temp: (!) 97.4 F (36.3 C) BP: 126/73 Pulse Rate: 76 SpO2: 99 %   Anthropometric Measurements Height: 5\' 2"  (1.575 m) Weight: 146 lb (66.2 kg) BMI (Calculated): 26.7 Weight at Last Visit: 147lb Weight Lost Since Last Visit: 1lb Weight Gained Since Last Visit: 0 Starting Weight: 183lb Total Weight Loss (lbs): 37 lb (16.8 kg)   Body Composition  Body Fat %: 41.5 % Fat Mass (lbs): 60.6 lbs Muscle Mass (lbs): 81 lbs Total Body Water (lbs): 66 lbs Visceral Fat Rating : 12   Other Clinical Data Fasting: no Labs: no Today's Visit #: 72 Starting Date: 06/24/19    OBESITY Shannon Bond is here to discuss her progress with her obesity treatment plan along with follow-up of her obesity related diagnoses.    Nutrition Plan: low carbohydrate plan - 98% adherence.  Current exercise: Yoga, resistance, and walking.  Interim History:  She is down 1 lb since her last visit. She is doing well on the low carbohydrate diet.    Hunger is moderately controlled.  Cravings are well controlled.  Assessment/Plan:   Mixed Dyslipidemia:  LDL is at goal. Medication(s): Crestor  Cardiovascular risk factors: advanced age (older than 75 for men, 32 for women), dyslipidemia, obesity (BMI >= 30 kg/m2), and sedentary lifestyle  Lab Results  Component Value Date   CHOL 126 10/25/2022   HDL 58.50 10/25/2022   LDLCALC 35 10/25/2022   TRIG 162.0 (H) 10/25/2022   CHOLHDL 2 10/25/2022   Lab Results  Component Value Date   ALT 17 10/25/2022   AST 22 10/25/2022   ALKPHOS 39 10/25/2022   BILITOT 0.5 10/25/2022   The ASCVD Risk score (Arnett DK, et al., 2019) failed to calculate for the following reasons:   The 2019 ASCVD risk score is only valid for ages 34 to  3  Plan:  Continue statin.  Will avoid all trans fats.  Will read labels Will minimize saturated fats except the following: low fat meats in moderation, diary, and limited dark chocolate.      Generalized Obesity: Current BMI BMI (Calculated): 26.7    Shannon Bond is currently in the action stage of change. As such, her goal is to continue with weight loss efforts.  She has agreed to low carbohydrate plan.  Exercise goals: Older adults with chronic conditions should understand whether and how their conditions affect their ability to do regular physical activity safely.  Behavioral modification strategies: increasing lean protein intake, decreasing simple carbohydrates , no meal skipping, meal planning , better snacking choices, planning for success, avoiding temptations, and mindful eating.  Shannon Bond has agreed to follow-up with our clinic in 4 weeks.      Objective:   VITALS: Per patient if applicable, see vitals. GENERAL: Alert and  in no acute distress. CARDIOPULMONARY: No increased WOB. Speaking in clear sentences.  PSYCH: Pleasant and cooperative. Speech normal rate and rhythm. Affect is appropriate. Insight and judgement are appropriate. Attention is focused, linear, and appropriate.  NEURO: Oriented as arrived to appointment on time with no prompting.   Attestation Statements:   This was prepared with the assistance of Engineer, civil (consulting).  Occasional wrong-word or sound-a-like substitutions may have occurred due to the inherent limitations of voice recognition   Corinna Capra, DO

## 2023-03-30 DIAGNOSIS — M6281 Muscle weakness (generalized): Secondary | ICD-10-CM | POA: Diagnosis not present

## 2023-03-30 DIAGNOSIS — M25551 Pain in right hip: Secondary | ICD-10-CM | POA: Diagnosis not present

## 2023-03-30 DIAGNOSIS — M25651 Stiffness of right hip, not elsewhere classified: Secondary | ICD-10-CM | POA: Diagnosis not present

## 2023-03-30 DIAGNOSIS — M545 Low back pain, unspecified: Secondary | ICD-10-CM | POA: Diagnosis not present

## 2023-04-04 DIAGNOSIS — M6281 Muscle weakness (generalized): Secondary | ICD-10-CM | POA: Diagnosis not present

## 2023-04-04 DIAGNOSIS — M25651 Stiffness of right hip, not elsewhere classified: Secondary | ICD-10-CM | POA: Diagnosis not present

## 2023-04-04 DIAGNOSIS — M545 Low back pain, unspecified: Secondary | ICD-10-CM | POA: Diagnosis not present

## 2023-04-04 DIAGNOSIS — M25551 Pain in right hip: Secondary | ICD-10-CM | POA: Diagnosis not present

## 2023-04-07 DIAGNOSIS — M545 Low back pain, unspecified: Secondary | ICD-10-CM | POA: Diagnosis not present

## 2023-04-07 DIAGNOSIS — M25551 Pain in right hip: Secondary | ICD-10-CM | POA: Diagnosis not present

## 2023-04-07 DIAGNOSIS — M6281 Muscle weakness (generalized): Secondary | ICD-10-CM | POA: Diagnosis not present

## 2023-04-07 DIAGNOSIS — M25651 Stiffness of right hip, not elsewhere classified: Secondary | ICD-10-CM | POA: Diagnosis not present

## 2023-04-10 DIAGNOSIS — M25651 Stiffness of right hip, not elsewhere classified: Secondary | ICD-10-CM | POA: Diagnosis not present

## 2023-04-10 DIAGNOSIS — M25551 Pain in right hip: Secondary | ICD-10-CM | POA: Diagnosis not present

## 2023-04-10 DIAGNOSIS — M545 Low back pain, unspecified: Secondary | ICD-10-CM | POA: Diagnosis not present

## 2023-04-10 DIAGNOSIS — M6281 Muscle weakness (generalized): Secondary | ICD-10-CM | POA: Diagnosis not present

## 2023-04-13 DIAGNOSIS — M25651 Stiffness of right hip, not elsewhere classified: Secondary | ICD-10-CM | POA: Diagnosis not present

## 2023-04-13 DIAGNOSIS — M6281 Muscle weakness (generalized): Secondary | ICD-10-CM | POA: Diagnosis not present

## 2023-04-13 DIAGNOSIS — M25551 Pain in right hip: Secondary | ICD-10-CM | POA: Diagnosis not present

## 2023-04-13 DIAGNOSIS — M545 Low back pain, unspecified: Secondary | ICD-10-CM | POA: Diagnosis not present

## 2023-04-17 DIAGNOSIS — M545 Low back pain, unspecified: Secondary | ICD-10-CM | POA: Diagnosis not present

## 2023-04-17 DIAGNOSIS — M6281 Muscle weakness (generalized): Secondary | ICD-10-CM | POA: Diagnosis not present

## 2023-04-17 DIAGNOSIS — M25651 Stiffness of right hip, not elsewhere classified: Secondary | ICD-10-CM | POA: Diagnosis not present

## 2023-04-17 DIAGNOSIS — M25551 Pain in right hip: Secondary | ICD-10-CM | POA: Diagnosis not present

## 2023-04-20 DIAGNOSIS — M25651 Stiffness of right hip, not elsewhere classified: Secondary | ICD-10-CM | POA: Diagnosis not present

## 2023-04-20 DIAGNOSIS — M545 Low back pain, unspecified: Secondary | ICD-10-CM | POA: Diagnosis not present

## 2023-04-20 DIAGNOSIS — M25551 Pain in right hip: Secondary | ICD-10-CM | POA: Diagnosis not present

## 2023-04-20 DIAGNOSIS — H10823 Rosacea conjunctivitis, bilateral: Secondary | ICD-10-CM | POA: Diagnosis not present

## 2023-04-20 DIAGNOSIS — M6281 Muscle weakness (generalized): Secondary | ICD-10-CM | POA: Diagnosis not present

## 2023-05-01 ENCOUNTER — Ambulatory Visit (INDEPENDENT_AMBULATORY_CARE_PROVIDER_SITE_OTHER): Payer: PPO | Admitting: Family Medicine

## 2023-05-01 ENCOUNTER — Encounter: Payer: Self-pay | Admitting: Family Medicine

## 2023-05-01 VITALS — BP 128/74 | HR 75 | Temp 98.0°F | Resp 16 | Ht 62.0 in | Wt 151.2 lb

## 2023-05-01 DIAGNOSIS — M810 Age-related osteoporosis without current pathological fracture: Secondary | ICD-10-CM

## 2023-05-01 DIAGNOSIS — I10 Essential (primary) hypertension: Secondary | ICD-10-CM | POA: Diagnosis not present

## 2023-05-01 DIAGNOSIS — E038 Other specified hypothyroidism: Secondary | ICD-10-CM | POA: Diagnosis not present

## 2023-05-01 DIAGNOSIS — I700 Atherosclerosis of aorta: Secondary | ICD-10-CM | POA: Diagnosis not present

## 2023-05-01 LAB — CBC
HCT: 37.7 % (ref 36.0–46.0)
Hemoglobin: 12.4 g/dL (ref 12.0–15.0)
MCHC: 32.9 g/dL (ref 30.0–36.0)
MCV: 101.9 fL — ABNORMAL HIGH (ref 78.0–100.0)
Platelets: 284 10*3/uL (ref 150.0–400.0)
RBC: 3.7 Mil/uL — ABNORMAL LOW (ref 3.87–5.11)
RDW: 14.2 % (ref 11.5–15.5)
WBC: 6.8 10*3/uL (ref 4.0–10.5)

## 2023-05-01 LAB — COMPREHENSIVE METABOLIC PANEL
ALT: 15 U/L (ref 0–35)
AST: 19 U/L (ref 0–37)
Albumin: 4.4 g/dL (ref 3.5–5.2)
Alkaline Phosphatase: 46 U/L (ref 39–117)
BUN: 53 mg/dL — ABNORMAL HIGH (ref 6–23)
CO2: 21 meq/L (ref 19–32)
Calcium: 9.5 mg/dL (ref 8.4–10.5)
Chloride: 108 meq/L (ref 96–112)
Creatinine, Ser: 0.96 mg/dL (ref 0.40–1.20)
GFR: 55.41 mL/min — ABNORMAL LOW (ref >60.00)
Glucose, Bld: 92 mg/dL (ref 70–99)
Potassium: 4.7 meq/L (ref 3.5–5.1)
Sodium: 137 meq/L (ref 135–145)
Total Bilirubin: 0.6 mg/dL (ref 0.2–1.2)
Total Protein: 6.6 g/dL (ref 6.0–8.3)

## 2023-05-01 LAB — LIPID PANEL
Cholesterol: 133 mg/dL (ref 0–200)
HDL: 62.9 mg/dL (ref >39.00)
LDL Cholesterol: 33 mg/dL (ref 0–99)
NonHDL: 70
Total CHOL/HDL Ratio: 2
Triglycerides: 183 mg/dL — ABNORMAL HIGH (ref 0.0–149.0)
VLDL: 36.6 mg/dL (ref 0.0–40.0)

## 2023-05-01 LAB — TSH: TSH: 1.48 u[IU]/mL (ref 0.35–5.50)

## 2023-05-01 LAB — VITAMIN D 25 HYDROXY (VIT D DEFICIENCY, FRACTURES): VITD: 45.78 ng/mL (ref 30.00–100.00)

## 2023-05-01 NOTE — Progress Notes (Signed)
 Chief Complaint  Patient presents with   Follow-up    Follow up    Subjective: Hyperlipidemia Patient presents for Hyperlipidemia follow up. Currently taking Crestor  20 mg daily and compliance with treatment thus far has been good. She denies myalgias. She is adhering to a healthy diet. Exercise: Walking, calisthenics The patient is not known to have coexisting coronary artery disease.  Hypertension Patient presents for hypertension follow up. She does not monitor home blood pressures. She is compliant with medications- hydrochlorothiazide  25 mg daily, lisinopril  30 mg daily. Patient has these side effects of medication: none Diet/exercise as above. No chest pain or shortness of breath.  Hypothyroidism Patient presents for follow-up of hypothyroidism.  Reports compliance with medication-levothyroxine  112 mcg daily. Current symptoms include: denies fatigue, weight changes, heat/cold intolerance, bowel/skin changes or CVS symptoms She believes her dose should be not significantly changed  Osteoporosis Patient has a history of osteoporosis.  She is taking Fosamax  70 mg weekly.  She reports compliance no adverse effects.  Exercise as above.  She does take calcium  and vitamin D  supplementation daily.  Past Medical History:  Diagnosis Date   Alcohol abuse    Allergy    Alopecia    Aortic atherosclerosis (HCC) 03/16/2021   Noted 12/2020 abdominal CT   Arthritis    Cardiac murmur 07/02/2019   Celiac disease    Chronic laryngitis 07/22/2019   Chronic rhinitis 07/22/2019   Diverticulitis    DOE (dyspnea on exertion) 07/02/2019   Dysphagia 07/22/2019   Edema of both lower extremities    Essential hypertension 01/24/2018   GERD (gastroesophageal reflux disease)    History of malignant neoplasm of skin 03/08/2021   Hypertension    Hypothyroidism 01/24/2018   Laryngopharyngeal reflux 07/22/2019   Moderate aortic regurgitation 10/11/2019   Obesity (BMI 30-39.9) 04/01/2019    Osteoarthritis    Osteoporosis 04/01/2019   Preop cardiovascular exam 03/09/2021   Seborrheic dermatitis of scalp 03/08/2021   Sensorineural hearing loss 07/22/2019   Squamous cell carcinoma in situ 03/08/2021   Subjective tinnitus 07/22/2019   Thyroid  disease    Thyroid  nodule 07/22/2019    Objective: BP 128/74   Pulse 75   Temp 98 F (36.7 C) (Oral)   Resp 16   Ht 5' 2 (1.575 m)   Wt 151 lb 3.2 oz (68.6 kg)   SpO2 100%   BMI 27.65 kg/m  General: Awake, appears stated age HEENT: MMM Heart: RRR, no LE edema, no bruits Lungs: CTAB, no rales, wheezes or rhonchi. No accessory muscle use Psych: Age appropriate judgment and insight, normal affect and mood  Assessment and Plan: Aortic atherosclerosis (HCC) - Plan: Comprehensive metabolic panel, Lipid panel  Essential hypertension - Plan: CBC  Other specified hypothyroidism - Plan: TSH  Osteoporosis, unspecified osteoporosis type, unspecified pathological fracture presence - Plan: VITAMIN D  25 Hydroxy (Vit-D Deficiency, Fractures)  Chronic, hopefully stable.  Continue Crestor  20 mg daily.  Counseled on diet and exercise. Chronic, stable.  Continue lisinopril  30 mg daily, hydrochlorothiazide  25 mg daily.. Chronic, stable.  Continue levothyroxine  112 mg daily. Chronic, stable.  Continue vitamin D /calcium  supplementation.  Continue Fosamax  70 mg weekly. F/u in 6 months. The patient voiced understanding and agreement to the plan.  Mabel [place], DO 05/01/23  11:47 AM

## 2023-05-01 NOTE — Patient Instructions (Signed)
 Keep the diet clean and stay active.  We will be in touch with your lab results.   Let us know if you need anything.

## 2023-05-02 ENCOUNTER — Ambulatory Visit (INDEPENDENT_AMBULATORY_CARE_PROVIDER_SITE_OTHER): Payer: PPO | Admitting: Bariatrics

## 2023-05-02 ENCOUNTER — Encounter: Payer: Self-pay | Admitting: Bariatrics

## 2023-05-02 VITALS — BP 116/69 | HR 75 | Temp 97.4°F | Ht 62.0 in | Wt 146.0 lb

## 2023-05-02 DIAGNOSIS — E669 Obesity, unspecified: Secondary | ICD-10-CM

## 2023-05-02 DIAGNOSIS — E039 Hypothyroidism, unspecified: Secondary | ICD-10-CM | POA: Diagnosis not present

## 2023-05-02 DIAGNOSIS — Z6826 Body mass index (BMI) 26.0-26.9, adult: Secondary | ICD-10-CM

## 2023-05-02 DIAGNOSIS — E038 Other specified hypothyroidism: Secondary | ICD-10-CM

## 2023-05-02 NOTE — Progress Notes (Signed)
 WEIGHT SUMMARY AND BIOMETRICS  Weight Lost Since Last Visit: 0  Weight Gained Since Last Visit: 0   Vitals Temp: (!) 97.4 F (36.3 C) BP: 116/69 Pulse Rate: 75 SpO2: 100 %   Anthropometric Measurements Height: 5' 2 (1.575 m) Weight: 146 lb (66.2 kg) BMI (Calculated): 26.7 Weight at Last Visit: 146lb Weight Lost Since Last Visit: 0 Weight Gained Since Last Visit: 0 Starting Weight: 183lb Total Weight Loss (lbs): 37 lb (16.8 kg)   Body Composition  Body Fat %: 42.3 % Fat Mass (lbs): 62 lbs Muscle Mass (lbs): 80.2 lbs Total Body Water (lbs): 68.8 lbs Visceral Fat Rating : 12   Other Clinical Data Fasting: no Labs: no Today's Visit #: 19 Starting Date: 06/24/19    OBESITY Sheritha is here to discuss her progress with her obesity treatment plan along with follow-up of her obesity related diagnoses.    Nutrition Plan: low carbohydrate plan - 98% adherence.  Current exercise:  resistance, yoga, balance, walking  Interim History:  Her weight remains the same.  Eating all of the food on the plan., Protein intake is as prescribed, Is not skipping meals, and Water intake is adequate. Hunger is moderately controlled.  Cravings are moderately controlled.  Assessment/Plan:   Hypothyroidism Stable.  Does not report symptoms associated with uncontrolled hypothyroidism. Medication(s): Levothyroxine  112 mcg daily. Lab Results  Component Value Date   TSH 1.48 05/01/2023    Plan: Continue levothyroxine  at current dose. Counseling: The correct way to take levothyroxine  is fasting, with water, separated by at least 30 minutes from breakfast, and separated by more than 4 hours from calcium , iron, multivitamins, acid reflux medications (PPIs).      Generalized Obesity: Current BMI BMI (Calculated): 26.7    Aiyanna is currently in the action stage of change.  As such, her goal is to continue with weight loss efforts.  She has agreed to low carbohydrate plan.  Exercise goals: Older adults should do exercises that maintain or improve balance if they are at risk of falling.   Behavioral modification strategies: increasing lean protein intake, no meal skipping, meal planning , planning for success, avoiding temptations, and mindful eating. She is doing strengthening and balance.   Tatem has agreed to follow-up with our clinic in 4 weeks.      Objective:   VITALS: Per patient if applicable, see vitals. GENERAL: Alert and in no acute distress. CARDIOPULMONARY: No increased WOB. Speaking in clear sentences.  PSYCH: Pleasant and cooperative. Speech normal rate and rhythm. Affect is appropriate. Insight and judgement are appropriate. Attention is focused, linear, and appropriate.  NEURO: Oriented as arrived to appointment on time with no prompting.   Attestation Statements:   This was prepared with the assistance of Engineer, Civil (consulting).  Occasional wrong-word or sound-a-like substitutions may have occurred due to the inherent limitations of voice recognition  Clayborne Daring, DO

## 2023-05-19 ENCOUNTER — Other Ambulatory Visit: Payer: Self-pay | Admitting: Family Medicine

## 2023-05-26 DIAGNOSIS — H02845 Edema of left lower eyelid: Secondary | ICD-10-CM | POA: Diagnosis not present

## 2023-05-26 DIAGNOSIS — H10823 Rosacea conjunctivitis, bilateral: Secondary | ICD-10-CM | POA: Diagnosis not present

## 2023-05-26 DIAGNOSIS — H02842 Edema of right lower eyelid: Secondary | ICD-10-CM | POA: Diagnosis not present

## 2023-05-30 DIAGNOSIS — H019 Unspecified inflammation of eyelid: Secondary | ICD-10-CM | POA: Diagnosis not present

## 2023-05-31 ENCOUNTER — Encounter: Payer: Self-pay | Admitting: Bariatrics

## 2023-05-31 ENCOUNTER — Ambulatory Visit (INDEPENDENT_AMBULATORY_CARE_PROVIDER_SITE_OTHER): Payer: PPO | Admitting: Bariatrics

## 2023-05-31 VITALS — BP 135/71 | HR 68 | Temp 97.4°F | Ht 62.0 in | Wt 148.0 lb

## 2023-05-31 DIAGNOSIS — E669 Obesity, unspecified: Secondary | ICD-10-CM | POA: Diagnosis not present

## 2023-05-31 DIAGNOSIS — I10 Essential (primary) hypertension: Secondary | ICD-10-CM

## 2023-05-31 DIAGNOSIS — Z6827 Body mass index (BMI) 27.0-27.9, adult: Secondary | ICD-10-CM

## 2023-05-31 NOTE — Progress Notes (Signed)
WEIGHT SUMMARY AND BIOMETRICS  Weight Lost Since Last Visit: 0  Weight Gained Since Last Visit: 2lb   Vitals Temp: (!) 97.4 F (36.3 C) BP: 135/71 Pulse Rate: 68 SpO2: 98 %   Anthropometric Measurements Height: 5\' 2"  (1.575 m) Weight: 148 lb (67.1 kg) BMI (Calculated): 27.06 Weight at Last Visit: 146lb Weight Lost Since Last Visit: 0 Weight Gained Since Last Visit: 2lb Starting Weight: 183lb Total Weight Loss (lbs): 35 lb (15.9 kg)   Body Composition  Body Fat %: 43 % Fat Mass (lbs): 63.8 lbs Muscle Mass (lbs): 80.2 lbs Total Body Water (lbs): 69 lbs Visceral Fat Rating : 12   Other Clinical Data Fasting: no Labs: no Today's Visit #: 30 Starting Date: 06/24/19    OBESITY Shannon Bond is here to discuss her progress with her obesity treatment plan along with follow-up of her obesity related diagnoses.    Nutrition Plan: low carbohydrate plan - 100% adherence.  Current exercise: walking and strength training.   Interim History:  She is up 2 lbs since her last visit.  Protein intake is as prescribed, Water intake is adequate., and Denies polyphagia   Hunger is moderately controlled.  Cravings are moderately controlled.  Assessment/Plan:   Hypertension Hypertension stable.  Medication(s): Hydrochlorothiazide 25 mg daily   BP Readings from Last 3 Encounters:  05/31/23 135/71  05/02/23 116/69  05/01/23 128/74   Lab Results  Component Value Date   CREATININE 0.96 05/01/2023   CREATININE 1.11 (H) 11/25/2022   CREATININE 1.14 10/25/2022   Lab Results  Component Value Date   GFR 55.41 (L) 05/01/2023   GFR 45.25 (L) 10/25/2022   GFR 57.98 (L) 04/25/2022    Plan: Continue all antihypertensives at current dosages. No added salt. Will keep sodium content to 1,500 mg or less per day.   She will increase her protein, water, and fiber.      Generalized Obesity: Current BMI BMI (Calculated): 27.06    Shannon Bond is currently in the action stage of change. As such, her goal is to maintain weight for now.  She has agreed to low carbohydrate plan.  Exercise goals: Older adults should determine their level of effort for physical activity relative to their level of fitness.  She is doing less exercise at this time.   Behavioral modification strategies: increasing lean protein intake, no meal skipping, meal planning , increase water intake, better snacking choices, planning for success, keep healthy foods in the home, and mindful eating.  Shannon Bond has agreed to follow-up with our clinic in 4 weeks.      Objective:   VITALS: Per patient if applicable, see vitals. GENERAL: Alert and in no acute distress. CARDIOPULMONARY: No increased WOB. Speaking in clear sentences.  PSYCH: Pleasant and cooperative. Speech normal rate and rhythm. Affect is appropriate. Insight and judgement are appropriate. Attention is focused, linear, and appropriate.  NEURO: Oriented as arrived to appointment on time with no prompting.   Attestation Statements:    This was prepared with the assistance of Engineer, civil (consulting).  Occasional wrong-word or sound-a-like substitutions may have occurred due to the inherent limitations of voice recognition   Corinna Capra, DO

## 2023-06-07 ENCOUNTER — Other Ambulatory Visit: Payer: Self-pay | Admitting: Family Medicine

## 2023-06-13 DIAGNOSIS — H019 Unspecified inflammation of eyelid: Secondary | ICD-10-CM | POA: Diagnosis not present

## 2023-06-15 ENCOUNTER — Other Ambulatory Visit: Payer: Self-pay | Admitting: Family Medicine

## 2023-06-15 DIAGNOSIS — I10 Essential (primary) hypertension: Secondary | ICD-10-CM

## 2023-06-21 ENCOUNTER — Other Ambulatory Visit: Payer: Self-pay

## 2023-06-22 ENCOUNTER — Encounter: Payer: Self-pay | Admitting: Cardiology

## 2023-06-22 ENCOUNTER — Ambulatory Visit: Payer: PPO | Attending: Cardiology | Admitting: Cardiology

## 2023-06-22 VITALS — BP 130/70 | HR 72 | Ht 62.6 in | Wt 154.1 lb

## 2023-06-22 DIAGNOSIS — I351 Nonrheumatic aortic (valve) insufficiency: Secondary | ICD-10-CM | POA: Diagnosis not present

## 2023-06-22 DIAGNOSIS — I10 Essential (primary) hypertension: Secondary | ICD-10-CM

## 2023-06-22 DIAGNOSIS — E782 Mixed hyperlipidemia: Secondary | ICD-10-CM

## 2023-06-22 DIAGNOSIS — I700 Atherosclerosis of aorta: Secondary | ICD-10-CM

## 2023-06-22 NOTE — Patient Instructions (Signed)
 Medication Instructions:  Your physician recommends that you continue on your current medications as directed. Please refer to the Current Medication list given to you today.  *If you need a refill on your cardiac medications before your next appointment, please call your pharmacy*   Lab Work: None Ordered If you have labs (blood work) drawn today and your tests are completely normal, you will receive your results only by: MyChart Message (if you have MyChart) OR A paper copy in the mail If you have any lab test that is abnormal or we need to change your treatment, we will call you to review the results.   Testing/Procedures: Echocardiogram An echocardiogram is a test that uses sound waves (ultrasound) to produce images of the heart. Images from an echocardiogram can provide important information about: Heart size and shape. The size and thickness and movement of your heart's walls. Heart muscle function and strength. Heart valve function or if you have stenosis. Stenosis is when the heart valves are too narrow. If blood is flowing backward through the heart valves (regurgitation). A tumor or infectious growth around the heart valves. Areas of heart muscle that are not working well because of poor blood flow or injury from a heart attack. Aneurysm detection. An aneurysm is a weak or damaged part of an artery wall. The wall bulges out from the normal force of blood pumping through the body. Tell a health care provider about: Any allergies you have. All medicines you are taking, including vitamins, herbs, eye drops, creams, and over-the-counter medicines. Any blood disorders you have. Any surgeries you have had. Any medical conditions you have. Whether you are pregnant or may be pregnant. What are the risks? Generally, this is a safe test. However, problems may occur, including an allergic reaction to dye (contrast) that may be used during the test. What happens before the test? No  specific preparation is needed. You may eat and drink normally. What happens during the test?  You will take off your clothes from the waist up and put on a hospital gown. Electrodes or electrocardiogram (ECG)patches may be placed on your chest. The electrodes or patches are then connected to a device that monitors your heart rate and rhythm. You will lie down on a table for an ultrasound exam. A gel will be applied to your chest to help sound waves pass through your skin. A handheld device, called a transducer, will be pressed against your chest and moved over your heart. The transducer produces sound waves that travel to your heart and bounce back (or "echo" back) to the transducer. These sound waves will be captured in real-time and changed into images of your heart that can be viewed on a video monitor. The images will be recorded on a computer and reviewed by your health care provider. You may be asked to change positions or hold your breath for a short time. This makes it easier to get different views or better views of your heart. In some cases, you may receive contrast through an IV in one of your veins. This can improve the quality of the pictures from your heart. The procedure may vary among health care providers and hospitals. What can I expect after the test? You may return to your normal, everyday life, including diet, activities, and medicines, unless your health care provider tells you not to do that. Follow these instructions at home: It is up to you to get the results of your test. Ask your health care provider,  or the department that is doing the test, when your results will be ready. Keep all follow-up visits. This is important. Summary An echocardiogram is a test that uses sound waves (ultrasound) to produce images of the heart. Images from an echocardiogram can provide important information about the size and shape of your heart, heart muscle function, heart valve function, and  other possible heart problems. You do not need to do anything to prepare before this test. You may eat and drink normally. After the echocardiogram is completed, you may return to your normal, everyday life, unless your health care provider tells you not to do that. This information is not intended to replace advice given to you by your health care provider. Make sure you discuss any questions you have with your health care provider. Document Revised: 12/16/2020 Document Reviewed: 11/26/2019 Elsevier Patient Education  2023 Elsevier Inc.        Follow-Up: At [facility], you and your health needs are our priority.  As part of our continuing mission to provide you with exceptional heart care, we have created designated Provider Care Teams.  These Care Teams include your primary Cardiologist (physician) and Advanced Practice Providers (APPs -  Physician Assistants and Nurse Practitioners) who all work together to provide you with the care you need, when you need it.  We recommend signing up for the patient portal called "MyChart".  Sign up information is provided on this After Visit Summary.  MyChart is used to connect with patients for Virtual Visits (Telemedicine).  Patients are able to view lab/test results, encounter notes, upcoming appointments, etc.  Non-urgent messages can be sent to your provider as well.   To learn more about what you can do with MyChart, go to [email or web address].    Your next appointment:   9 month follow up

## 2023-06-22 NOTE — Progress Notes (Signed)
 Cardiology Office Note:    Date:  06/22/2023   ID:  Shannon Bond, DOB November 22, 1940, MRN [id number]  PCP:  Shannon Dory, DO  Cardiologist:  Shannon Brothers, MD   Referring MD: Shannon Bond*    ASSESSMENT:    1. Mixed dyslipidemia   2. Moderate aortic regurgitation   3. Essential hypertension   4. Aortic atherosclerosis (HCC)    PLAN:    In order of problems listed above:  Primary prevention stressed with the patient.  Importance of compliance with diet medication stressed and patient verbalized standing. She is doing well in terms of exercise and I congratulated her about this. Essential hypertension: Blood pressure is stable and diet was emphasized.  Lifestyle modification urged salt and diet intake issues discussed Mixed dyslipidemia: On lipid-lowering medications followed by primary care.  Lipids reviewed.  She tells me that she wants her cholesterol to be low because of cerebrovascular disease as directed by primary care. Moderate aortic regurgitation-appears to be stable and asymptomatic.  We will follow-up with an echocardiogram Patient will be seen in follow-up appointment in 6 months or earlier if the patient has any concerns.  Medication Adjustments/Labs and Tests Ordered: Current medicines are reviewed at length with the patient today.  Concerns regarding medicines are outlined above.  Orders Placed This Encounter  Procedures   EKG 12-Lead   No orders of the defined types were placed in this encounter.    No chief complaint on file.    History of Present Illness:    Shannon Bond is a 83 y.o. female.  Patient has past medical history of essential hypertension, mixed dyslipidemia and moderate aortic regurgitation.  She denies any problems at this time and takes care of activities of daily living.  No chest pain orthopnea or PND.  At the time of my evaluation, the patient is alert awake oriented and in no distress.  She mentions to me of having  cerebrovascular disease.  She is asymptomatic.  She does believe that she walks about half an hour a day 4-5 times a week without any symptoms.  At the time of my evaluation, the patient is alert awake oriented and in no distress.  Past Medical History:  Diagnosis Date   Alcohol abuse    Allergy    Alopecia    Aortic atherosclerosis (HCC) 03/16/2021   Noted 12/2020 abdominal CT   Arthritis    BMI 27.0-27.9,adult 05/25/2022   Cardiac murmur 07/02/2019   Celiac disease    Chronic laryngitis 07/22/2019   Chronic rhinitis 07/22/2019   Diverticulitis    DOE (dyspnea on exertion) 07/02/2019   Dysphagia 07/22/2019   Edema of both lower extremities    Essential hypertension 01/24/2018   Generalized obesity 01/27/2022   Starting BMI 33.47     GERD (gastroesophageal reflux disease)    History of malignant neoplasm of skin 03/08/2021   Hypertension    Hypertension 03/08/2021   Hyperthyroidism 03/08/2021   Hypothyroidism 01/24/2018   IBS (irritable bowel syndrome) 03/08/2021   Laryngopharyngeal reflux 07/22/2019   Mixed dyslipidemia 10/07/2021   Moderate aortic regurgitation 10/11/2019   Obesity (BMI 30-39.9) 04/01/2019   Osteoarthritis    Osteoporosis 04/01/2019   Other hyperlipidemia 01/03/2022   Preop cardiovascular exam 03/09/2021   Seborrheic dermatitis of scalp 03/08/2021   Sensorineural hearing loss 07/22/2019   Squamous cell carcinoma in situ 03/08/2021   Subjective tinnitus 07/22/2019   Thyroid disease    Thyroid nodule 07/22/2019    Past Surgical History:  Procedure Laterality Date   APPENDECTOMY  1954   CERVICAL CONE BIOPSY  1965 and 1994   COLONOSCOPY     2007 said 13 years ago. Said that she does the hemoocult cards test at home    ESOPHAGOGASTRODUODENOSCOPY     2014   REPLACEMENT TOTAL KNEE BILATERAL  2003    Current Medications: Current Meds  Medication Sig   acetaminophen (TYLENOL) 325 MG tablet Take 650 mg by mouth every 6 (six) hours as needed for  headache.   alendronate (FOSAMAX) 70 MG tablet Take 1 tablet (70 mg total) by mouth once a week. Take with a full glass of water on an empty stomach   estradiol (ESTRACE) 0.1 MG/GM vaginal cream PLACE 1 APPLICATORFUL VAGINALLY AT BEDTIME.   Famotidine (PEPCID AC PO) Take 1 tablet by mouth daily.   gabapentin (NEURONTIN) 100 MG capsule Take 1-2 capsules (100-200 mg total) by mouth at bedtime.   hydrochlorothiazide (HYDRODIURIL) 25 MG tablet TAKE 1 TABLET BY MOUTH EVERY DAY   ipratropium (ATROVENT) 0.06 % nasal spray Place 2 sprays into both nostrils 3 (three) times daily.   lactobacillus acidophilus (BACID) TABS tablet Take 2 tablets by mouth daily.   levothyroxine (SYNTHROID) 112 MCG tablet Take 1 tablet (112 mcg total) by mouth daily before breakfast.   lisinopril (ZESTRIL) 30 MG tablet TAKE 1 TABLET BY MOUTH EVERY DAY   Melatonin-Pyridoxine (MELATIN) 3-1 MG TABS Take 1 tablet by mouth at bedtime.   Multiple Vitamin (MULTIVITAMIN WITH MINERALS) TABS tablet Take 1 tablet by mouth daily.   PAPAYA ENZYME PO Take 1 capsule by mouth daily.   rosuvastatin (CRESTOR) 20 MG tablet Take 1 tablet (20 mg total) by mouth daily.   S-Adenosylmethionine (SAM-E) 400 MG TABS Take 400 mg by mouth 2 (two) times daily.   Triamcinolone Acetonide (NASACORT ALLERGY 24HR NA) Place 1-2 sprays into the nose as needed for allergies or rhinitis.     Allergies:   Bactrim [sulfamethoxazole-trimethoprim], Chlorine, Latex, Macrobid [nitrofurantoin], Nsaids, and Penicillins   Social History   Socioeconomic History   Marital status: Married    Spouse name: Wilfored   Number of children: 3   Years of education: Not on file   Highest education level: Bachelor's degree (e.g., BA, AB, BS)  Occupational History   Occupation: Retired   Tobacco Use   Smoking status: Never   Smokeless tobacco: Never   Tobacco comments:    admit to trying to smoke   Vaping Use   Vaping status: Never Used  Substance and Sexual Activity    Alcohol use: Not Currently    Comment: Patient is a alcoholic   Drug use: Never   Sexual activity: Not on file  Other Topics Concern   Not on file  Social History Narrative   Not on file   Social Drivers of Health   Financial Resource Strain: Low Risk  (04/25/2023)   Overall Financial Resource Strain (CARDIA)    Difficulty of Paying Living Expenses: Not hard at all  Food Insecurity: No Food Insecurity (04/25/2023)   Hunger Vital Sign    Worried About Running Out of Food in the Last Year: Never true    Ran Out of Food in the Last Year: Never true  Transportation Needs: No Transportation Needs (04/25/2023)   PRAPARE - Administrator, Civil Service (Medical): No    Lack of Transportation (Non-Medical): No  Physical Activity: Sufficiently Active (04/25/2023)   Exercise Vital Sign    Days of Exercise  per Week: 7 days    Minutes of Exercise per Session: 60 min  Stress: Stress Concern Present (04/25/2023)   [employer] of Occupational Health - Occupational Stress Questionnaire    Feeling of Stress : Rather much  Social Connections: Moderately Integrated (04/25/2023)   Social Connection and Isolation Panel [NHANES]    Frequency of Communication with Friends and Family: More than three times a week    Frequency of Social Gatherings with Friends and Family: More than three times a week    Attends Religious Services: Never    Database administrator or Organizations: Yes    Attends Engineer, structural: More than 4 times per year    Marital Status: Married     Family History: The patient's family history includes AAA (abdominal aortic aneurysm) in her mother; Alcohol abuse in her father; Breast cancer in her maternal grandmother; Cancer in her maternal grandmother; Diabetes in her son; Early death in her father and mother; Hypertension in her mother; Lung cancer in her father; Mental illness in her maternal grandfather. There is no history of Esophageal cancer, Colon cancer,  Rectal cancer, or Stomach cancer.  ROS:   Please see the history of present illness.    All other systems reviewed and are negative.  EKGs/Labs/Other Studies Reviewed:    The following studies were reviewed today: .Marland KitchenEKG Interpretation Date/Time:  Thursday June 22 2023 11:03:12 EST Ventricular Rate:  72 PR Interval:  186 QRS Duration:  98 QT Interval:  376 QTC Calculation: 411 R Axis:   -66  Text Interpretation: Normal sinus rhythm Left axis deviation Incomplete right bundle branch block No previous ECGs available Confirmed by Belva Crome [phone]) on 06/22/2023 11:16:32 AM     Recent Labs: 05/01/2023: ALT 15; BUN 53; Creatinine, Ser 0.96; Hemoglobin 12.4; Platelets 284.0; Potassium 4.7; Sodium 137; TSH 1.48  Recent Lipid Panel    Component Value Date/Time   CHOL 133 05/01/2023 1117   TRIG 183.0 (H) 05/01/2023 1117   HDL 62.90 05/01/2023 1117   CHOLHDL 2 05/01/2023 1117   VLDL 36.6 05/01/2023 1117   LDLCALC 33 05/01/2023 1117    Physical Exam:    VS:  BP 130/70   Pulse 72   Ht 5' 2.6" (1.59 m)   Wt 154 lb 1.3 oz (69.9 kg)   SpO2 97%   BMI 27.64 kg/m     Wt Readings from Last 3 Encounters:  06/22/23 154 lb 1.3 oz (69.9 kg)  05/31/23 148 lb (67.1 kg)  05/02/23 146 lb (66.2 kg)     GEN: Patient is in no acute distress HEENT: Normal NECK: No JVD; No carotid bruits LYMPHATICS: No lymphadenopathy CARDIAC: Hear sounds regular, 2/6 systolic murmur at the apex. RESPIRATORY:  Clear to auscultation without rales, wheezing or rhonchi  ABDOMEN: Soft, non-tender, non-distended MUSCULOSKELETAL:  No edema; No deformity  SKIN: Warm and dry NEUROLOGIC:  Alert and oriented x 3 PSYCHIATRIC:  Normal affect   Signed, Shannon Brothers, MD  06/22/2023 11:24 AM    [facility]

## 2023-06-29 ENCOUNTER — Ambulatory Visit: Payer: PPO | Admitting: Bariatrics

## 2023-06-29 ENCOUNTER — Encounter: Payer: Self-pay | Admitting: Bariatrics

## 2023-06-29 VITALS — BP 116/66 | HR 70 | Temp 97.5°F | Ht 62.0 in | Wt 150.0 lb

## 2023-06-29 DIAGNOSIS — E039 Hypothyroidism, unspecified: Secondary | ICD-10-CM

## 2023-06-29 DIAGNOSIS — Z6827 Body mass index (BMI) 27.0-27.9, adult: Secondary | ICD-10-CM

## 2023-06-29 DIAGNOSIS — E669 Obesity, unspecified: Secondary | ICD-10-CM | POA: Diagnosis not present

## 2023-06-29 DIAGNOSIS — E038 Other specified hypothyroidism: Secondary | ICD-10-CM

## 2023-06-29 NOTE — Progress Notes (Signed)
 WEIGHT SUMMARY AND BIOMETRICS  Weight Lost Since Last Visit: 0  Weight Gained Since Last Visit: 2lb   Vitals Temp: (!) 97.5 F (36.4 C) BP: 116/66 Pulse Rate: 70 SpO2: 97 %   Anthropometric Measurements Height: 5\' 2"  (1.575 m) Weight: 150 lb (68 kg) BMI (Calculated): 27.43 Weight at Last Visit: 148lb Weight Lost Since Last Visit: 0 Weight Gained Since Last Visit: 2lb Starting Weight: 183lb Total Weight Loss (lbs): 33 lb (15 kg)   Body Composition  Body Fat %: 43.3 % Fat Mass (lbs): 65 lbs Muscle Mass (lbs): 80.6 lbs Total Body Water (lbs): 70 lbs Visceral Fat Rating : 13   Other Clinical Data Fasting: No Labs: No Today's Visit #: 29 Starting Date: 06/24/19    OBESITY Esbeydi is here to discuss her progress with her obesity treatment plan along with follow-up of her obesity related diagnoses.    Nutrition Plan: low carbohydrate plan - 95% adherence.  Current exercise: walking, strength and balance, 60 mins, 7 days  Interim History:  She is up 2 lbs since her last visit. She ate more at her wedding anniversary dinner.  Eating all of the food on the plan., Protein intake is as prescribed, and Water intake is adequate.   Hunger is moderately controlled.  Cravings are moderately controlled.   Assessment/Plan:   Hypothyroidism Stable.  Does not report symptoms associated with uncontrolled hypothyroidism. Medication(s): Levothyroxine 112 mcg daily. Lab Results  Component Value Date   TSH 1.48 05/01/2023    Plan: Continue levothyroxine at current dose. Counseling: The correct way to take levothyroxine is fasting, with water, separated by at least 30 minutes from breakfast, and separated by more than 4 hours from calcium, iron, multivitamins, acid reflux medications (PPIs).    Generalized Obesity: Current BMI BMI (Calculated): 27.43     Kenijah is currently in the action stage of change. As such, her goal is to maintain weight for now.  She has agreed to low carbohydrate plan.  Exercise goals: Older adults should determine their level of effort for physical activity relative to their level of fitness.  She is doing walking and strength and balance.   Behavioral modification strategies: increasing lean protein intake, decreasing simple carbohydrates , increase water intake, better snacking choices, planning for success, keep healthy foods in the home, measure portion sizes, and mindful eating.  Jasmen has agreed to follow-up with our clinic in 4 weeks.      Objective:   VITALS: Per patient if applicable, see vitals. GENERAL: Alert and in no acute distress. CARDIOPULMONARY: No increased WOB. Speaking in clear sentences.  PSYCH: Pleasant and cooperative. Speech normal rate and rhythm. Affect is appropriate. Insight and judgement are appropriate. Attention is focused, linear, and appropriate.  NEURO: Oriented as arrived to appointment on time with no prompting.   Attestation Statements:   This was prepared with the  assistance of Engineer, civil (consulting).  Occasional wrong-word or sound-a-like substitutions may have occurred due to the inherent limitations of voice recognition   Corinna Capra, DO

## 2023-07-03 ENCOUNTER — Other Ambulatory Visit: Payer: Self-pay | Admitting: Cardiology

## 2023-07-03 DIAGNOSIS — I351 Nonrheumatic aortic (valve) insufficiency: Secondary | ICD-10-CM

## 2023-07-03 DIAGNOSIS — E782 Mixed hyperlipidemia: Secondary | ICD-10-CM

## 2023-07-03 DIAGNOSIS — I10 Essential (primary) hypertension: Secondary | ICD-10-CM

## 2023-07-03 DIAGNOSIS — I700 Atherosclerosis of aorta: Secondary | ICD-10-CM

## 2023-07-13 ENCOUNTER — Ambulatory Visit (HOSPITAL_BASED_OUTPATIENT_CLINIC_OR_DEPARTMENT_OTHER)
Admission: RE | Admit: 2023-07-13 | Discharge: 2023-07-13 | Disposition: A | Discharge status: Home / Self Care | Source: Ambulatory Visit | Attending: Cardiology | Admitting: Cardiology

## 2023-07-13 DIAGNOSIS — I351 Nonrheumatic aortic (valve) insufficiency: Secondary | ICD-10-CM | POA: Insufficient documentation

## 2023-07-13 LAB — ECHOCARDIOGRAM COMPLETE
AR max vel: 2.58 cm2
AV Area VTI: 2.4 cm2
AV Area mean vel: 2.4 cm2
AV Mean grad: 4 mmHg
AV Peak grad: 7.3 mmHg
AV Vena cont: 0.4 cm
Ao pk vel: 1.35 m/s
Area-P 1/2: 5.62 cm2
Calc EF: 63.6 %
MV M vel: 4.08 m/s
MV Peak grad: 66.6 mmHg
P 1/2 time: 562 ms
S' Lateral: 3.1 cm
Single Plane A2C EF: 60 %
Single Plane A4C EF: 66.6 %

## 2023-07-27 ENCOUNTER — Ambulatory Visit: Admitting: Bariatrics

## 2023-07-27 ENCOUNTER — Encounter: Payer: Self-pay | Admitting: Bariatrics

## 2023-07-27 VITALS — BP 157/71 | HR 65 | Temp 97.6°F | Ht 62.0 in | Wt 151.0 lb

## 2023-07-27 DIAGNOSIS — I10 Essential (primary) hypertension: Secondary | ICD-10-CM | POA: Diagnosis not present

## 2023-07-27 DIAGNOSIS — E669 Obesity, unspecified: Secondary | ICD-10-CM | POA: Diagnosis not present

## 2023-07-27 DIAGNOSIS — Z6827 Body mass index (BMI) 27.0-27.9, adult: Secondary | ICD-10-CM | POA: Diagnosis not present

## 2023-07-27 NOTE — Progress Notes (Signed)
 WEIGHT SUMMARY AND BIOMETRICS  Weight Lost Since Last Visit: 0  Weight Gained Since Last Visit: 1lb   Vitals Temp: 97.6 F (36.4 C) BP: (!) 150/74 Pulse Rate: 65 SpO2: 98 %   Anthropometric Measurements Height: 5\' 2"  (1.575 m) Weight: 151 lb (68.5 kg) BMI (Calculated): 27.61 Weight at Last Visit: 150lb Weight Lost Since Last Visit: 0 Weight Gained Since Last Visit: 1lb Starting Weight: 183lb Total Weight Loss (lbs): 32 lb (14.5 kg)   Body Composition  Body Fat %: 43.2 % Fat Mass (lbs): 65.4 lbs Muscle Mass (lbs): 81.4 lbs Visceral Fat Rating : 13   Other Clinical Data Fasting: no Labs: no Today's Visit #: 69 Starting Date: 06/24/19    OBESITY Shannon Bond is here to discuss her progress with her obesity treatment plan along with follow-up of her obesity related diagnoses.    Nutrition Plan: low carbohydrate plan - 95% adherence.  Current exercise: walking and balance/strength.  Interim History:  She is up 1 lb since her last visit.  Eating all of the food on the plan., Protein intake is as prescribed, Is not skipping meals, and Water intake is adequate.   Hunger is moderately controlled.  Cravings are moderately controlled.  Assessment/Plan:   Hypertension She states that she has been taking her blood pressure medication as prescribed but that she has been under more stress lately due to the economy.  Hypertension borderline controlled.  Medication(s): Hydrochlorothiazide 25 mg daily   BP Readings from Last 3 Encounters:  07/27/23 (!) 157/71  06/29/23 116/66  06/22/23 130/70   Lab Results  Component Value Date   CREATININE 0.96 05/01/2023   CREATININE 1.11 (H) 11/25/2022   CREATININE 1.14 10/25/2022   Lab Results  Component Value Date   GFR 55.41 (L) 05/01/2023   GFR 45.25 (L) 10/25/2022   GFR 57.98 (L) 04/25/2022     Plan: Continue all antihypertensives at current dosages. No added salt. Will keep sodium content to 1,500 mg or less per day.  Discussed ways to decrease stress.   Generalized Obesity: Current BMI BMI (Calculated): 27.61    Shannon Bond is currently in the action stage of change. As such, her goal is to continue with weight loss efforts.  She has agreed to low carbohydrate plan.  Exercise goals: Older adults should determine their level of effort for physical activity relative to their level of fitness.  She is doing balance, strength training. She continues to exercise.   Behavioral modification strategies: increasing lean protein intake, no meal skipping, meal planning , increase water intake, better snacking choices, planning for success, increasing vegetables, decrease snacking , and keep healthy foods in the home.  Shannon Bond has agreed to follow-up with our clinic in 4 weeks.    Objective:   VITALS: Per patient if applicable, see vitals. GENERAL: Alert and in no acute distress. CARDIOPULMONARY: No increased WOB.  Speaking in clear sentences.  PSYCH: Pleasant and cooperative. Speech normal rate and rhythm. Affect is appropriate. Insight and judgement are appropriate. Attention is focused, linear, and appropriate.  NEURO: Oriented as arrived to appointment on time with no prompting.   Attestation Statements:     This was prepared with the assistance of Engineer, civil (consulting).  Occasional wrong-word or sound-a-like substitutions may have occurred due to the inherent limitations of voice recognition   Corinna Capra, DO

## 2023-08-03 ENCOUNTER — Other Ambulatory Visit: Payer: Self-pay | Admitting: Family Medicine

## 2023-08-16 ENCOUNTER — Other Ambulatory Visit: Payer: Self-pay | Admitting: Family Medicine

## 2023-08-24 ENCOUNTER — Ambulatory Visit: Admitting: Bariatrics

## 2023-08-24 VITALS — BP 149/72 | HR 70 | Ht 62.0 in | Wt 149.0 lb

## 2023-08-24 DIAGNOSIS — E669 Obesity, unspecified: Secondary | ICD-10-CM | POA: Diagnosis not present

## 2023-08-24 DIAGNOSIS — I10 Essential (primary) hypertension: Secondary | ICD-10-CM

## 2023-08-24 DIAGNOSIS — Z6827 Body mass index (BMI) 27.0-27.9, adult: Secondary | ICD-10-CM

## 2023-08-24 NOTE — Progress Notes (Signed)
 WEIGHT SUMMARY AND BIOMETRICS  Weight Lost Since Last Visit: 2lb  Weight Gained Since Last Visit: 0   Vitals BP: (!) 149/72 Pulse Rate: 70 SpO2: 100 %   Anthropometric Measurements Height: 5\' 2"  (1.575 m) Weight: 149 lb (67.6 kg) BMI (Calculated): 27.25 Weight at Last Visit: 151lb Weight Lost Since Last Visit: 2lb Weight Gained Since Last Visit: 0 Starting Weight: 183lb Total Weight Loss (lbs): 34 lb (15.4 kg)   Body Composition  Body Fat %: 42.3 % Fat Mass (lbs): 63.2 lbs Muscle Mass (lbs): 82 lbs Total Body Water (lbs): 68.2 lbs Visceral Fat Rating : 12   Other Clinical Data Fasting: no Labs: no Today's Visit #: 32 Starting Date: 06/24/19    OBESITY Shannon Bond is here to discuss her progress with her obesity treatment plan along with follow-up of her obesity related diagnoses.    Nutrition Plan: low carbohydrate plan - 100% adherence.  Current exercise: walking and strength training/ balance. training.   Interim History:  She is down another 2 lbs. her blood pressure has been slightly up intermittently she is taking her medications as prescribed.  Eating all of the food on the plan., Protein intake is as prescribed, Is not skipping meals, and Water intake is adequate.  Hunger is moderately controlled.  Cravings are moderately controlled.  Assessment/Plan:   Hypertension Hypertension stable.  Medication(s): Hydrochlorothiazide  25 mg daily  She has swelling sometimes in the evening if she does not put her feet up.  She is minimizing salt.  She is taking her medication as prescribed.  She feels like she has been under a lot more stress and feels this is contributing to her blood pressure.  BP Readings from Last 3 Encounters:  08/24/23 (!) 149/72  07/27/23 (!) 157/71  06/29/23 116/66   Lab Results  Component Value Date   CREATININE 0.96  05/01/2023   CREATININE 1.11 (H) 11/25/2022   CREATININE 1.14 10/25/2022   Lab Results  Component Value Date   GFR 55.41 (L) 05/01/2023   GFR 45.25 (L) 10/25/2022   GFR 57.98 (L) 04/25/2022    Plan: Continue all antihypertensives at current dosages. No added salt. Will keep sodium content to 1,500 mg or less per day.   She will get Shannon Bond's salt which is a salt substitute which has neither potassium chloride or sodium.  She will wear support hose when she goes for movie or family night. She will start to do some guided imagery in the evening for 10 minutes. She will continue to wear her insoles   Generalized Obesity: Current BMI BMI (Calculated): 27.25   Shannon Bond is currently in the action stage of change. As such, her goal is to continue with weight loss efforts.  She has agreed to low carbohydrate plan.  Exercise goals: Older adults should determine their level of effort for physical activity relative to their level  of fitness.   Behavioral modification strategies: increasing lean protein intake, no meal skipping, meal planning , increase water intake, better snacking choices, planning for success, and avoiding temptations.  Shannon Bond has agreed to follow-up with our clinic in 4 weeks.     Objective:   VITALS: Per patient if applicable, see vitals. GENERAL: Alert and in no acute distress. CARDIOPULMONARY: No increased WOB. Speaking in clear sentences.  PSYCH: Pleasant and cooperative. Speech normal rate and rhythm. Affect is appropriate. Insight and judgement are appropriate. Attention is focused, linear, and appropriate.  NEURO: Oriented as arrived to appointment on time with no prompting.   Attestation Statements:    This was prepared with the assistance of Engineer, civil (consulting).  Occasional wrong-word or sound-a-like substitutions may have occurred due to the inherent limitations of voice recognition   Kirk Peper, DO

## 2023-08-27 ENCOUNTER — Other Ambulatory Visit: Payer: Self-pay | Admitting: Family Medicine

## 2023-08-27 DIAGNOSIS — I10 Essential (primary) hypertension: Secondary | ICD-10-CM

## 2023-09-10 ENCOUNTER — Other Ambulatory Visit: Payer: Self-pay | Admitting: Family Medicine

## 2023-09-10 DIAGNOSIS — I10 Essential (primary) hypertension: Secondary | ICD-10-CM

## 2023-09-24 ENCOUNTER — Other Ambulatory Visit: Payer: Self-pay | Admitting: Family Medicine

## 2023-09-28 ENCOUNTER — Encounter: Payer: Self-pay | Admitting: Bariatrics

## 2023-09-28 ENCOUNTER — Encounter: Payer: Self-pay | Admitting: Family Medicine

## 2023-09-28 ENCOUNTER — Ambulatory Visit: Admitting: Bariatrics

## 2023-09-28 VITALS — BP 130/70 | HR 63 | Temp 97.7°F | Ht 62.0 in | Wt 151.0 lb

## 2023-09-28 DIAGNOSIS — I700 Atherosclerosis of aorta: Secondary | ICD-10-CM

## 2023-09-28 DIAGNOSIS — E669 Obesity, unspecified: Secondary | ICD-10-CM | POA: Diagnosis not present

## 2023-09-28 DIAGNOSIS — Z6827 Body mass index (BMI) 27.0-27.9, adult: Secondary | ICD-10-CM

## 2023-09-28 NOTE — Progress Notes (Signed)
                                                                                                              WEIGHT SUMMARY AND BIOMETRICS  Weight Lost Since Last Visit: 0  Weight Gained Since Last Visit: 2lb   Vitals Temp: 97.7 F (36.5 C) BP: 130/70 Pulse Rate: 63 SpO2: 100 %   Anthropometric Measurements Height: 5' 2 (1.575 m) Weight: 151 lb (68.5 kg) BMI (Calculated): 27.61 Weight at Last Visit: 149lb Weight Lost Since Last Visit: 0 Weight Gained Since Last Visit: 2lb Starting Weight: 183lb Total Weight Loss (lbs): 32 lb (14.5 kg)   Body Composition  Body Fat %: 42.7 % Fat Mass (lbs): 64.6 lbs Muscle Mass (lbs): 82.2 lbs Total Body Water (lbs): 68.2 lbs Visceral Fat Rating : 13   Other Clinical Data Fasting: no Labs: no Today's Visit #: 71 Starting Date: 06/24/19    OBESITY Timberly is here to discuss her progress with her obesity treatment plan along with follow-up of her obesity related diagnoses.    Nutrition Plan: low carbohydrate plan - 98% adherence.  Current exercise: swimming and walking/balance and walking.  Interim History:  She is up 2 lbs since her last visit.  Eating all of the food on the plan., Protein intake is as prescribed, Is not skipping meals, Meeting protein goals., Denies polyphagia, and Denies excessive cravings.   Hunger is moderately controlled.  Cravings are moderately controlled.  Assessment/Plan:   Aortic atherosclerosis:   She has a history of aortic atherosclerosis.  She is very active and has been exercising. Her muscle mass has either been consistent or increasing.   Plan:  She will continue exercise both cardiac and resistance.  She will continue a low-fat high-protein low-carb diet.   Generalized Obesity: Current BMI BMI (Calculated): 27.61    Lizette is currently in the action stage of change. As such, her goal is to continue with weight loss efforts.  She has agreed to low carbohydrate  plan.  Exercise goals: Older adults should determine their level of effort for physical activity relative to their level of fitness.   Behavioral modification strategies: increasing lean protein intake, avoiding temptations, keep healthy foods in the home, weigh protein portions, measure portion sizes, mindful eating, and eat on smaller plate.  Xochilth has agreed to follow-up with our clinic in 4 weeks.     Objective:   VITALS: Per patient if applicable, see vitals. GENERAL: Alert and in no acute distress. CARDIOPULMONARY: No increased WOB. Speaking in clear sentences.  PSYCH: Pleasant and cooperative. Speech normal rate and rhythm. Affect is appropriate. Insight and judgement are appropriate. Attention is focused, linear, and appropriate.  NEURO: Oriented as arrived to appointment on time with no prompting.   Attestation Statements:   This was prepared with the assistance of Engineer, civil (consulting).  Occasional wrong-word or sound-a-like substitutions may have occurred due to the inherent limitations of voice recognition   Kirk Peper, DO

## 2023-09-30 ENCOUNTER — Other Ambulatory Visit: Payer: Self-pay | Admitting: Family Medicine

## 2023-10-03 ENCOUNTER — Other Ambulatory Visit: Payer: Self-pay

## 2023-10-03 ENCOUNTER — Other Ambulatory Visit: Payer: Self-pay | Admitting: Family Medicine

## 2023-10-04 ENCOUNTER — Other Ambulatory Visit: Payer: Self-pay | Admitting: Family Medicine

## 2023-10-04 MED ORDER — IPRATROPIUM BROMIDE 0.06 % NA SOLN: 2.0000 | NASAL | 3 refills | Status: DC | PRN

## 2023-10-06 ENCOUNTER — Other Ambulatory Visit: Payer: Self-pay

## 2023-10-09 ENCOUNTER — Other Ambulatory Visit: Payer: Self-pay

## 2023-10-09 MED ORDER — IPRATROPIUM BROMIDE 0.06 % NA SOLN: NASAL | 1 refills | Status: AC

## 2023-10-09 NOTE — Addendum Note (Signed)
 Addended by: Kendarious Gudino M on: 10/09/2023 07:12 AM   Modules accepted: Orders

## 2023-10-14 ENCOUNTER — Other Ambulatory Visit: Payer: Self-pay | Admitting: Family Medicine

## 2023-10-25 ENCOUNTER — Encounter: Payer: Self-pay | Admitting: Bariatrics

## 2023-10-25 ENCOUNTER — Ambulatory Visit (INDEPENDENT_AMBULATORY_CARE_PROVIDER_SITE_OTHER): Admitting: Bariatrics

## 2023-10-25 VITALS — BP 117/66 | HR 63 | Temp 97.7°F | Ht 62.0 in | Wt 148.0 lb

## 2023-10-25 DIAGNOSIS — E669 Obesity, unspecified: Secondary | ICD-10-CM

## 2023-10-25 DIAGNOSIS — E782 Mixed hyperlipidemia: Secondary | ICD-10-CM

## 2023-10-25 DIAGNOSIS — Z6827 Body mass index (BMI) 27.0-27.9, adult: Secondary | ICD-10-CM | POA: Diagnosis not present

## 2023-10-25 NOTE — Progress Notes (Signed)
 WEIGHT SUMMARY AND BIOMETRICS  Weight Lost Since Last Visit: 3lb  Weight Gained Since Last Visit: 0   Vitals Temp: 97.7 F (36.5 C) BP: 117/66 Pulse Rate: 63 SpO2: 99 %   Anthropometric Measurements Height: 5' 2 (1.575 m) Weight: 148 lb (67.1 kg) BMI (Calculated): 27.06 Weight at Last Visit: 151lb Weight Lost Since Last Visit: 3lb Weight Gained Since Last Visit: 0 Starting Weight: 183lb Total Weight Loss (lbs): 35 lb (15.9 kg)   Body Composition  Body Fat %: 41.6 % Fat Mass (lbs): 62 lbs Muscle Mass (lbs): 82.4 lbs Total Body Water (lbs): 64.4 lbs Visceral Fat Rating : 12   Other Clinical Data Fasting: no Labs: no Today's Visit #: 74 Starting Date: 06/24/19    OBESITY Benelli is here to discuss her progress with her obesity treatment plan along with follow-up of her obesity related diagnoses.    Nutrition Plan: low carbohydrate plan - 98% adherence.  Current exercise: Strength,core,walking, swimming.  Interim History:  She is down 3 lbs since her last visit. She has been able to maintain her weight well.  Eating all of the food on the plan., Protein intake is as prescribed, Is not skipping meals, Journaling consistently., Water intake is adequate., and Denies polyphagia  Hunger is moderately controlled.  Cravings are moderately controlled.  Assessment/Plan:   Mixed dyslipidemia:   She is taking Crestor  for dyslipidemia and denies side effects.   Plan: She will continue Crestor .  She will continue diet and exercise. Information sheets given on healthy alternatives for carbs and protein. She will have a fiber supplement in the afternoon with water to help with the afternoon appetite increase.   Generalized Obesity: Current BMI BMI (Calculated): 27.06    Afsheen is currently in the action stage of change. As such, her goal is to maintain  weight for now.  She has agreed to low carbohydrate plan.  Exercise goals: Older adults should determine their level of effort for physical activity relative to their level of fitness.   Behavioral modification strategies: increasing lean protein intake, meal planning , increase water intake, planning for success, increasing vegetables, avoiding temptations, keep healthy foods in the home, weigh protein portions, work on smaller portions, and mindful eating.  Ryna has agreed to follow-up with our clinic in 4 weeks.   Objective:   VITALS: Per patient if applicable, see vitals. GENERAL: Alert and in no acute distress. CARDIOPULMONARY: No increased WOB. Speaking in clear sentences.  PSYCH: Pleasant and cooperative. Speech normal rate and rhythm. Affect is appropriate. Insight and judgement are appropriate. Attention is focused, linear, and appropriate.  NEURO: Oriented as arrived to appointment on time with no prompting.   Attestation Statements:   This was prepared with the assistance of Engineer, civil (consulting).  Occasional wrong-word or sound-a-like substitutions may have occurred due to the inherent limitations of voice recognition  Clayborne Daring, DO

## 2023-11-06 ENCOUNTER — Encounter: Payer: PPO | Admitting: Family Medicine

## 2023-11-08 ENCOUNTER — Encounter: Payer: Self-pay | Admitting: Family Medicine

## 2023-11-08 ENCOUNTER — Ambulatory Visit: Payer: Self-pay | Admitting: Family Medicine

## 2023-11-08 ENCOUNTER — Ambulatory Visit: Admitting: Family Medicine

## 2023-11-08 VITALS — BP 126/74 | HR 87 | Temp 98.0°F | Resp 16 | Ht 62.0 in | Wt 149.0 lb

## 2023-11-08 DIAGNOSIS — M810 Age-related osteoporosis without current pathological fracture: Secondary | ICD-10-CM

## 2023-11-08 DIAGNOSIS — I10 Essential (primary) hypertension: Secondary | ICD-10-CM | POA: Diagnosis not present

## 2023-11-08 DIAGNOSIS — Z0000 Encounter for general adult medical examination without abnormal findings: Secondary | ICD-10-CM | POA: Diagnosis not present

## 2023-11-08 DIAGNOSIS — E038 Other specified hypothyroidism: Secondary | ICD-10-CM

## 2023-11-08 DIAGNOSIS — N39490 Overflow incontinence: Secondary | ICD-10-CM

## 2023-11-08 LAB — COMPREHENSIVE METABOLIC PANEL WITH GFR
ALT: 27 U/L (ref 0–35)
AST: 25 U/L (ref 0–37)
Albumin: 4.4 g/dL (ref 3.5–5.2)
Alkaline Phosphatase: 55 U/L (ref 39–117)
BUN: 71 mg/dL — ABNORMAL HIGH (ref 6–23)
CO2: 19 meq/L (ref 19–32)
Calcium: 9.6 mg/dL (ref 8.4–10.5)
Chloride: 108 meq/L (ref 96–112)
Creatinine, Ser: 1.44 mg/dL — ABNORMAL HIGH (ref 0.40–1.20)
GFR: 33.94 mL/min — ABNORMAL LOW (ref >60.00)
Glucose, Bld: 96 mg/dL (ref 70–99)
Potassium: 5.3 meq/L — ABNORMAL HIGH (ref 3.5–5.1)
Sodium: 138 meq/L (ref 135–145)
Total Bilirubin: 0.6 mg/dL (ref 0.2–1.2)
Total Protein: 6.8 g/dL (ref 6.0–8.3)

## 2023-11-08 LAB — LIPID PANEL
Cholesterol: 133 mg/dL (ref 0–200)
HDL: 52.7 mg/dL (ref >39.00)
LDL Cholesterol: 47 mg/dL (ref 0–99)
NonHDL: 80.04
Total CHOL/HDL Ratio: 3
Triglycerides: 166 mg/dL — ABNORMAL HIGH (ref 0.0–149.0)
VLDL: 33.2 mg/dL (ref 0.0–40.0)

## 2023-11-08 LAB — CBC
HCT: 32.1 % — ABNORMAL LOW (ref 36.0–46.0)
Hemoglobin: 10.7 g/dL — ABNORMAL LOW (ref 12.0–15.0)
MCHC: 33.5 g/dL (ref 30.0–36.0)
MCV: 98.4 fl (ref 78.0–100.0)
Platelets: 229 K/uL (ref 150.0–400.0)
RBC: 3.26 Mil/uL — ABNORMAL LOW (ref 3.87–5.11)
RDW: 13.2 % (ref 11.5–15.5)
WBC: 5.1 K/uL (ref 4.0–10.5)

## 2023-11-08 LAB — T4, FREE: Free T4: 1.01 ng/dL (ref 0.60–1.60)

## 2023-11-08 LAB — VITAMIN D 25 HYDROXY (VIT D DEFICIENCY, FRACTURES): VITD: 41.85 ng/mL (ref 30.00–100.00)

## 2023-11-08 LAB — TSH: TSH: 4.8 u[IU]/mL (ref 0.35–5.50)

## 2023-11-08 MED ORDER — MIRABEGRON ER 25 MG PO TB24: 25.0000 mg | ORAL_TABLET | Freq: Every day | ORAL | 1 refills | Status: DC

## 2023-11-08 NOTE — Patient Instructions (Addendum)
 Give us  2-3 business days to get the results of your labs back.   Keep the diet clean and stay active.  Let me know if you change your mind about the medicine for urination.   Let us  know if you need anything.

## 2023-11-08 NOTE — Progress Notes (Signed)
 Chief Complaint  Patient presents with   Annual Exam     Well Woman Shannon Bond is here for a complete physical.   Her last physical was >1 year ago.  Current diet: in general, a healthy diet. Current exercise: swimming, strength training, walking, balance exercises. Weight is stable and she denies daytime fatigue. Seatbelt? Yes Advanced directive? Yes  Health Maintenance Shingrix- Yes DEXA- Yes Tetanus- Yes Pneumonia- Yes  Patient has a history of urinary incontinence.  She will leak urine before she can make it to the restroom.  She does not have any issues when she sneezes, laughs, or coughs.  There is no pain, bleeding, discharge, or constipation.  She was on Vesicare years ago for nocturia which did not help but also did not cause any side effects.  Past Medical History:  Diagnosis Date   Alcohol abuse    Allergy    Alopecia    Aortic atherosclerosis (HCC) 03/16/2021   Noted 12/2020 abdominal CT   Arthritis    BMI 27.0-27.9,adult 05/25/2022   Cardiac murmur 07/02/2019   Celiac disease    Chronic laryngitis 07/22/2019   Chronic rhinitis 07/22/2019   Diverticulitis    DOE (dyspnea on exertion) 07/02/2019   Dysphagia 07/22/2019   Edema of both lower extremities    Essential hypertension 01/24/2018   Generalized obesity 01/27/2022   Starting BMI 33.47     GERD (gastroesophageal reflux disease)    History of malignant neoplasm of skin 03/08/2021   Hypertension    Hypertension 03/08/2021   Hyperthyroidism 03/08/2021   Hypothyroidism 01/24/2018   IBS (irritable bowel syndrome) 03/08/2021   Laryngopharyngeal reflux 07/22/2019   Mixed dyslipidemia 10/07/2021   Moderate aortic regurgitation 10/11/2019   Obesity (BMI 30-39.9) 04/01/2019   Osteoarthritis    Osteoporosis 04/01/2019   Other hyperlipidemia 01/03/2022   Preop cardiovascular exam 03/09/2021   Seborrheic dermatitis of scalp 03/08/2021   Sensorineural hearing loss 07/22/2019   Squamous cell carcinoma  in situ 03/08/2021   Subjective tinnitus 07/22/2019   Thyroid  disease    Thyroid  nodule 07/22/2019     Past Surgical History:  Procedure Laterality Date   APPENDECTOMY  1954   CERVICAL CONE BIOPSY  1965 and 1994   COLONOSCOPY     2007 said 13 years ago. Said that she does the hemoocult cards test at home    ESOPHAGOGASTRODUODENOSCOPY     2014   REPLACEMENT TOTAL KNEE BILATERAL  2003    Medications  Current Outpatient Medications on File Prior to Visit  Medication Sig Dispense Refill   acetaminophen (TYLENOL) 325 MG tablet Take 650 mg by mouth every 6 (six) hours as needed for headache.     alendronate  (FOSAMAX ) 70 MG tablet TAKE 1 TABLET BY MOUTH ONCE A WEEK. TAKE WITH A FULL GLASS OF WATER ON AN EMPTY STOMACH 12 tablet 3   estradiol  (ESTRACE ) 0.1 MG/GM vaginal cream PLACE 1 APPLICATORFUL VAGINALLY AT BEDTIME. 42.5 g 1   Famotidine (PEPCID AC PO) Take 1 tablet by mouth daily.     hydrochlorothiazide  (HYDRODIURIL ) 25 MG tablet TAKE 1 TABLET BY MOUTH TWICE A DAY 180 tablet 1   ipratropium (ATROVENT ) 0.06 % nasal spray Place 3 sprays into both nostrils as needed for rhinitis. 135 mL 1   lactobacillus acidophilus (BACID) TABS tablet Take 2 tablets by mouth daily.     levothyroxine  (SYNTHROID ) 112 MCG tablet Take 1 tablet (112 mcg total) by mouth daily before breakfast. 90 tablet 1   lisinopril  (ZESTRIL ) 30 MG  tablet TAKE 1 TABLET BY MOUTH EVERY DAY 90 tablet 0   Melatonin-Pyridoxine (MELATIN) 3-1 MG TABS Take 1 tablet by mouth at bedtime.     Multiple Vitamin (MULTIVITAMIN WITH MINERALS) TABS tablet Take 1 tablet by mouth daily.     PAPAYA ENZYME PO Take 1 capsule by mouth daily.     rosuvastatin  (CRESTOR ) 20 MG tablet TAKE 1 TABLET BY MOUTH EVERY DAY 90 tablet 0   S-Adenosylmethionine (SAM-E) 400 MG TABS Take 400 mg by mouth 2 (two) times daily.     Triamcinolone Acetonide (NASACORT ALLERGY 24HR NA) Place 1-2 sprays into the nose as needed for allergies or rhinitis.       Allergies Allergies  Allergen Reactions   Bactrim [Sulfamethoxazole-Trimethoprim]     Nausea, Vomiting, Abdominal Pain   Chlorine    Latex    Macrobid [Nitrofurantoin]     Diarrhea   Nsaids     Said it upset her stomach really bad    Penicillins     Review of Systems: Constitutional:  no fevers Eye:  no recent significant change in vision Ears:  No changes in hearing Nose/Mouth/Throat:  no complaints of nasal congestion, no sore throat Cardiovascular: no chest pain Respiratory:  No shortness of breath Gastrointestinal:  No change in bowel habits GU:  Female: negative for dysuria, + incontinence Integumentary:  no abnormal skin lesions reported Neurologic:  no headaches Endocrine:  denies unexplained weight changes  Exam BP 126/74 (BP Location: Left Arm, Patient Position: Sitting)   Pulse 87   Temp 98 F (36.7 C) (Oral)   Resp 16   Ht 5' 2 (1.575 m)   Wt 149 lb (67.6 kg)   SpO2 97%   BMI 27.25 kg/m  General:  well developed, well nourished, in no apparent distress Skin:  no significant moles, warts, or growths Head:  no masses, lesions, or tenderness Eyes:  pupils equal and round, sclera anicteric without injection Ears:  canals without lesions, TMs shiny without retraction, no obvious effusion, no erythema Nose:  nares patent, mucosa normal, and no drainage Throat/Pharynx:  lips and gingiva without lesion; tongue and uvula midline; non-inflamed pharynx; no exudates or postnasal drainage Neck: neck supple without adenopathy, thyromegaly, or masses Lungs:  clear to auscultation, breath sounds equal bilaterally, no respiratory distress Cardio:  regular rate and rhythm, no bruits or LE edema Abdomen:  abdomen soft, nontender; bowel sounds normal; no masses or organomegaly Genital: Deferred Neuro:  gait normal; deep tendon reflexes normal and symmetric Psych: well oriented with normal range of affect and appropriate judgment/insight  Assessment and Plan  Well  adult exam  Essential hypertension - Plan: Lipid panel, CBC, Comprehensive metabolic panel with GFR  Other specified hypothyroidism - Plan: TSH, T4, free  Overflow incontinence of urine - Plan: mirabegron  ER (MYRBETRIQ ) 25 MG TB24 tablet  Osteoporosis, unspecified osteoporosis type, unspecified pathological fracture presence - Plan: VITAMIN D  25 Hydroxy (Vit-D Deficiency, Fractures)   Well 83 y.o. female. Counseled on diet and exercise. Overflow incontinence: Chronic, not controlled.  Start Myrbetriq  25 mg daily.  Let me know if there are cost issues.  Could consider Vesicare or Ditropan.  Send message in 1 month to update me if not doing better. Other orders as above. Follow up in 6 mo. The patient voiced understanding and agreement to the plan.  Mabel [place], DO 11/08/23 9:48 AM

## 2023-11-09 ENCOUNTER — Other Ambulatory Visit: Payer: Self-pay

## 2023-11-09 DIAGNOSIS — E875 Hyperkalemia: Secondary | ICD-10-CM

## 2023-11-09 DIAGNOSIS — R7989 Other specified abnormal findings of blood chemistry: Secondary | ICD-10-CM

## 2023-11-11 ENCOUNTER — Other Ambulatory Visit: Payer: Self-pay | Admitting: Family Medicine

## 2023-11-15 ENCOUNTER — Other Ambulatory Visit (INDEPENDENT_AMBULATORY_CARE_PROVIDER_SITE_OTHER)

## 2023-11-15 DIAGNOSIS — E875 Hyperkalemia: Secondary | ICD-10-CM | POA: Diagnosis not present

## 2023-11-15 DIAGNOSIS — R7989 Other specified abnormal findings of blood chemistry: Secondary | ICD-10-CM | POA: Diagnosis not present

## 2023-11-15 LAB — BASIC METABOLIC PANEL WITH GFR
BUN/Creatinine Ratio: 58 (calc) — ABNORMAL HIGH (ref 6–22)
BUN: 70 mg/dL — ABNORMAL HIGH (ref 7–25)
CO2: 18 mmol/L — ABNORMAL LOW (ref 20–32)
Calcium: 9.9 mg/dL (ref 8.6–10.4)
Chloride: 107 mmol/L (ref 98–110)
Creat: 1.2 mg/dL — ABNORMAL HIGH (ref 0.60–0.95)
Glucose, Bld: 102 mg/dL — ABNORMAL HIGH (ref 65–99)
Potassium: 5.5 mmol/L — ABNORMAL HIGH (ref 3.5–5.3)
Sodium: 135 mmol/L (ref 135–146)
eGFR: 45 mL/min/1.73m2 — ABNORMAL LOW (ref >=60)

## 2023-11-16 ENCOUNTER — Ambulatory Visit: Payer: Self-pay | Admitting: Family Medicine

## 2023-11-17 ENCOUNTER — Other Ambulatory Visit: Payer: Self-pay

## 2023-11-17 DIAGNOSIS — E875 Hyperkalemia: Secondary | ICD-10-CM

## 2023-11-20 ENCOUNTER — Other Ambulatory Visit (INDEPENDENT_AMBULATORY_CARE_PROVIDER_SITE_OTHER)

## 2023-11-20 DIAGNOSIS — E875 Hyperkalemia: Secondary | ICD-10-CM

## 2023-11-21 ENCOUNTER — Ambulatory Visit: Payer: Self-pay | Admitting: Family Medicine

## 2023-11-21 ENCOUNTER — Encounter: Payer: Self-pay | Admitting: Family Medicine

## 2023-11-21 LAB — BASIC METABOLIC PANEL WITH GFR
BUN/Creatinine Ratio: 39 (calc) — ABNORMAL HIGH (ref 6–22)
BUN: 44 mg/dL — ABNORMAL HIGH (ref 7–25)
CO2: 24 mmol/L (ref 20–32)
Calcium: 9.5 mg/dL (ref 8.6–10.4)
Chloride: 105 mmol/L (ref 98–110)
Creat: 1.13 mg/dL — ABNORMAL HIGH (ref 0.60–0.95)
Glucose, Bld: 114 mg/dL — ABNORMAL HIGH (ref 65–99)
Potassium: 4.9 mmol/L (ref 3.5–5.3)
Sodium: 136 mmol/L (ref 135–146)
eGFR: 49 mL/min/1.73m2 — ABNORMAL LOW (ref >=60)

## 2023-11-23 ENCOUNTER — Encounter: Payer: Self-pay | Admitting: Family Medicine

## 2023-11-23 ENCOUNTER — Other Ambulatory Visit: Payer: Self-pay | Admitting: Family Medicine

## 2023-11-23 ENCOUNTER — Encounter: Payer: Self-pay | Admitting: Bariatrics

## 2023-11-23 ENCOUNTER — Ambulatory Visit: Admitting: Bariatrics

## 2023-11-23 VITALS — BP 150/64 | HR 77 | Temp 97.7°F | Ht 62.0 in | Wt 150.0 lb

## 2023-11-23 DIAGNOSIS — I10 Essential (primary) hypertension: Secondary | ICD-10-CM | POA: Diagnosis not present

## 2023-11-23 DIAGNOSIS — E669 Obesity, unspecified: Secondary | ICD-10-CM

## 2023-11-23 DIAGNOSIS — Z6827 Body mass index (BMI) 27.0-27.9, adult: Secondary | ICD-10-CM

## 2023-11-23 MED ORDER — AMLODIPINE BESYLATE 5 MG PO TABS: 5.0000 mg | ORAL_TABLET | Freq: Every day | ORAL | 3 refills | Status: DC

## 2023-11-23 NOTE — Addendum Note (Signed)
 Addended by: DELORES SHIELDS A on: 11/23/2023 12:01 PM   Modules accepted: Level of Service

## 2023-11-23 NOTE — Progress Notes (Addendum)
 WEIGHT SUMMARY AND BIOMETRICS  Weight Lost Since Last Visit: 0  Weight Gained Since Last Visit: 2lb   Vitals Temp: 97.7 F (36.5 C) BP: (!) 150/64 Pulse Rate: 77 SpO2: 99 %   Anthropometric Measurements Height: 5' 2 (1.575 m) Weight: 150 lb (68 kg) BMI (Calculated): 27.43 Weight at Last Visit: 148lb Weight Lost Since Last Visit: 0 Weight Gained Since Last Visit: 2lb Starting Weight: 183lb Total Weight Loss (lbs): 33 lb (15 kg)   Body Composition  Body Fat %: 43.3 % Fat Mass (lbs): 65 lbs Muscle Mass (lbs): 80.8 lbs Total Body Water (lbs): 65.6 lbs Visceral Fat Rating : 13   Other Clinical Data Fasting: no Labs: no Today's Visit #: 60 Starting Date: 06/24/19    OBESITY Shannon Bond is here to discuss her progress with her obesity treatment plan along with follow-up of her obesity related diagnoses.    Nutrition Plan: low carbohydrate plan/ Low protein diet - 0% adherence.  Current exercise: swimming, walking, and strength, core, balance.  Interim History:  She is up 2 lbs since her last visit.  Eating all of the food on the plan., Is not skipping meals, Water intake is adequate., and Denies excessive cravings.   Pharmacotherapy: She is not on any antiobesity medications.  Hunger is moderately controlled.  Cravings are moderately controlled.  Assessment/Plan:   Hypertension Hypertension control uncertain.  Medication(s): Hydrochlorothiazide  25 mg daily  She states that her primary care provider took her off of her blood pressure medication.  Her GFR had been down to 45 but is now at 49 on 11/15/2023 her potassium level was up to 4.5 but is now back down to 4.9.  BP Readings from Last 3 Encounters:  11/23/23 (!) 150/64  11/08/23 126/74  10/25/23 117/66   Lab Results  Component Value Date   CREATININE 1.13 (H) 11/20/2023   CREATININE 1.20  (H) 11/15/2023   CREATININE 1.44 (H) 11/08/2023   Lab Results  Component Value Date   GFR 33.94 (L) 11/08/2023   GFR 55.41 (L) 05/01/2023   GFR 45.25 (L) 10/25/2022    Plan: Continue all antihypertensives at current dosages. She will record her blood pressures and call her PCP with the readings.  No added salt. Will keep sodium content to 1,500 mg or less per day.   Will keep your phosphorus low.      Generalized Obesity: Current BMI BMI (Calculated): 27.43   Shannon Bond is currently in the action stage of change. As such, her goal is to continue with weight loss efforts.  She has agreed to low carbohydrate plan.  Exercise goals: Older adults should determine their level of effort for physical activity relative to their level of fitness.   Behavioral modification strategies: no meal skipping, meal planning , increase water intake, better snacking choices, planning for success, increasing vegetables, increasing fiber rich foods, keep healthy  foods in the home, measure portion sizes, work on smaller portions, and mindful eating.  Shannon Bond has agreed to follow-up with our clinic in 4 weeks.      Objective:   VITALS: Per patient if applicable, see vitals. GENERAL: Alert and in no acute distress. CARDIOPULMONARY: No increased WOB. Speaking in clear sentences.  PSYCH: Pleasant and cooperative. Speech normal rate and rhythm. Affect is appropriate. Insight and judgement are appropriate. Attention is focused, linear, and appropriate.  NEURO: Oriented as arrived to appointment on time with no prompting.   Attestation Statements:    Time spent on visit including pre-visit chart review and post-visit charting and care was 36 minutes.    We discussed plant-based protein sources.  We discussed how to combine protein vegetables and other foods to have a complete protein. We discussed seasoning and how to minimize salt.  We discussed ways to minimize excessive potassium.  We consulted with check  GPT and complexity sights to come up with appropriate foods. We discussed that she will call her PCP and discuss her blood pressure.  She will also discuss her hemoglobin hematocrit with her PCP.  This was prepared with the assistance of Engineer, civil (consulting).  Occasional wrong-word or sound-a-like substitutions may have occurred due to the inherent limitations of voice recognition   Clayborne Daring, DO

## 2023-12-01 ENCOUNTER — Other Ambulatory Visit: Payer: Self-pay | Admitting: Family Medicine

## 2023-12-01 MED ORDER — CARVEDILOL 12.5 MG PO TABS: 12.5000 mg | ORAL_TABLET | Freq: Two times a day (BID) | ORAL | 3 refills | Status: DC

## 2023-12-02 ENCOUNTER — Other Ambulatory Visit: Payer: Self-pay | Admitting: Family Medicine

## 2023-12-02 DIAGNOSIS — N39490 Overflow incontinence: Secondary | ICD-10-CM

## 2023-12-03 ENCOUNTER — Other Ambulatory Visit: Payer: Self-pay | Admitting: Family Medicine

## 2023-12-08 ENCOUNTER — Other Ambulatory Visit: Payer: Self-pay | Admitting: Family Medicine

## 2023-12-08 DIAGNOSIS — I10 Essential (primary) hypertension: Secondary | ICD-10-CM

## 2023-12-11 DIAGNOSIS — Z961 Presence of intraocular lens: Secondary | ICD-10-CM | POA: Diagnosis not present

## 2023-12-11 DIAGNOSIS — H43813 Vitreous degeneration, bilateral: Secondary | ICD-10-CM | POA: Diagnosis not present

## 2023-12-11 DIAGNOSIS — H26492 Other secondary cataract, left eye: Secondary | ICD-10-CM | POA: Diagnosis not present

## 2023-12-11 DIAGNOSIS — H10823 Rosacea conjunctivitis, bilateral: Secondary | ICD-10-CM | POA: Diagnosis not present

## 2023-12-19 ENCOUNTER — Ambulatory Visit (INDEPENDENT_AMBULATORY_CARE_PROVIDER_SITE_OTHER): Admitting: Family Medicine

## 2023-12-19 ENCOUNTER — Encounter: Payer: Self-pay | Admitting: Family Medicine

## 2023-12-19 VITALS — BP 146/64 | Temp 98.0°F | Resp 16 | Ht 62.0 in | Wt 159.2 lb

## 2023-12-19 DIAGNOSIS — I10 Essential (primary) hypertension: Secondary | ICD-10-CM | POA: Diagnosis not present

## 2023-12-19 NOTE — Progress Notes (Signed)
 Chief Complaint  Patient presents with   Follow-up    Follow Up BP    Subjective Shannon Bond is a 83 y.o. female who presents for hypertension follow up. She does monitor home blood pressures. Blood pressures ranging from 120-150's/60-80's on average. She is compliant with medications- hydrochlorothiazide  25 mg bid, Coreg  12.5 bid. Patient has these side effects of medication: none from hydrochlorothiazide ; Coreg  causing depression and swelling She is adhering to a healthy diet overall. Current exercise: cardio, strength training No CP or SOB.    Past Medical History:  Diagnosis Date   Alcohol abuse    Allergy    Alopecia    Aortic atherosclerosis (HCC) 03/16/2021   Noted 12/2020 abdominal CT   Arthritis    BMI 27.0-27.9,adult 05/25/2022   Cardiac murmur 07/02/2019   Celiac disease    Chronic laryngitis 07/22/2019   Chronic rhinitis 07/22/2019   Diverticulitis    DOE (dyspnea on exertion) 07/02/2019   Dysphagia 07/22/2019   Edema of both lower extremities    Essential hypertension 01/24/2018   Generalized obesity 01/27/2022   Starting BMI 33.47     GERD (gastroesophageal reflux disease)    History of malignant neoplasm of skin 03/08/2021   Hypertension    Hypertension 03/08/2021   Hyperthyroidism 03/08/2021   Hypothyroidism 01/24/2018   IBS (irritable bowel syndrome) 03/08/2021   Laryngopharyngeal reflux 07/22/2019   Mixed dyslipidemia 10/07/2021   Moderate aortic regurgitation 10/11/2019   Obesity (BMI 30-39.9) 04/01/2019   Osteoarthritis    Osteoporosis 04/01/2019   Other hyperlipidemia 01/03/2022   Preop cardiovascular exam 03/09/2021   Seborrheic dermatitis of scalp 03/08/2021   Sensorineural hearing loss 07/22/2019   Squamous cell carcinoma in situ 03/08/2021   Subjective tinnitus 07/22/2019   Thyroid  disease    Thyroid  nodule 07/22/2019    Exam BP (!) 146/64 (BP Location: Left Arm, Cuff Size: Normal)   Temp 98 F (36.7 C) (Oral)   Resp 16   Ht  5' 2 (1.575 m)   Wt 159 lb 3.2 oz (72.2 kg)   SpO2 98%   BMI 29.12 kg/m  General:  well developed, well nourished, in no apparent distress Heart: RRR, no bruits, no LE edema Lungs: clear to auscultation, no accessory muscle use Psych: well oriented with normal range of affect and appropriate judgment/insight  Essential hypertension - Plan: CBC, Basic metabolic panel with GFR  Chronic, not controlled. Ck BMP. Stop Coreg  due to AE's. Cont hydrochlorothiazide  25 mg bid. Hopefully can consider ARB pending results. Counseled on diet and exercise. F/u in 1 mo. The patient voiced understanding and agreement to the plan.  Mabel [place], DO 12/19/23  2:03 PM

## 2023-12-19 NOTE — Patient Instructions (Signed)
 Give us  2-3 business days to get the results of your labs back.   Keep the diet clean and stay active.  Let us  know if you need anything.

## 2023-12-20 ENCOUNTER — Ambulatory Visit: Payer: Self-pay | Admitting: Family Medicine

## 2023-12-20 ENCOUNTER — Other Ambulatory Visit: Payer: Self-pay | Admitting: Family Medicine

## 2023-12-20 DIAGNOSIS — I10 Essential (primary) hypertension: Secondary | ICD-10-CM

## 2023-12-20 LAB — CBC
HCT: 35.5 % — ABNORMAL LOW (ref 36.0–46.0)
Hemoglobin: 11.9 g/dL — ABNORMAL LOW (ref 12.0–15.0)
MCHC: 33.4 g/dL (ref 30.0–36.0)
MCV: 99.3 fl (ref 78.0–100.0)
Platelets: 241 K/uL (ref 150.0–400.0)
RBC: 3.58 Mil/uL — ABNORMAL LOW (ref 3.87–5.11)
RDW: 13.3 % (ref 11.5–15.5)
WBC: 6.1 K/uL (ref 4.0–10.5)

## 2023-12-20 LAB — BASIC METABOLIC PANEL WITH GFR
BUN: 35 mg/dL — ABNORMAL HIGH (ref 6–23)
CO2: 27 meq/L (ref 19–32)
Calcium: 9.5 mg/dL (ref 8.4–10.5)
Chloride: 99 meq/L (ref 96–112)
Creatinine, Ser: 0.99 mg/dL (ref 0.40–1.20)
GFR: 53.16 mL/min — ABNORMAL LOW (ref >60.00)
Glucose, Bld: 94 mg/dL (ref 70–99)
Potassium: 4.4 meq/L (ref 3.5–5.1)
Sodium: 137 meq/L (ref 135–145)

## 2023-12-20 MED ORDER — OLMESARTAN MEDOXOMIL 20 MG PO TABS: 20.0000 mg | ORAL_TABLET | Freq: Every day | ORAL | 2 refills | Status: DC

## 2023-12-21 ENCOUNTER — Ambulatory Visit (INDEPENDENT_AMBULATORY_CARE_PROVIDER_SITE_OTHER): Admitting: Bariatrics

## 2023-12-21 ENCOUNTER — Encounter: Payer: Self-pay | Admitting: Family Medicine

## 2023-12-21 ENCOUNTER — Encounter: Payer: Self-pay | Admitting: Bariatrics

## 2023-12-21 VITALS — BP 153/74 | HR 67 | Temp 97.9°F | Ht 62.0 in | Wt 154.0 lb

## 2023-12-21 DIAGNOSIS — I10 Essential (primary) hypertension: Secondary | ICD-10-CM | POA: Diagnosis not present

## 2023-12-21 DIAGNOSIS — Z6828 Body mass index (BMI) 28.0-28.9, adult: Secondary | ICD-10-CM | POA: Diagnosis not present

## 2023-12-21 DIAGNOSIS — E669 Obesity, unspecified: Secondary | ICD-10-CM | POA: Diagnosis not present

## 2023-12-21 NOTE — Progress Notes (Deleted)
                                                                                                              WEIGHT SUMMARY AND BIOMETRICS  No data recorded No data recorded  No data recorded No data recorded No data recorded No data recorded  OBESITY Vaniah is here to discuss her progress with her obesity treatment plan along with follow-up of her obesity related diagnoses.    Nutrition Plan: low carbohydrate plan - ***% adherence.  Current exercise: {exercise types:16438}  Interim History:  *** {aabnutritionassessment:29213}   Pharmacotherapy: Marshall is on {dwwpharmacotherapy:29109} Adverse side effects: {dwwse:29122} Hunger is {EWCONTROLASSESSMENT:24261}.  Cravings are {EWCONTROLASSESSMENT:24261}.  Assessment/Plan:   There are no diagnoses linked to this encounter.    {dwwmorbid:29108::Morbid Obesity}: Current BMI No data recorded  Pharmacotherapy Plan {dwwmed:29123}  {dwwpharmacotherapy:29109}  Asusena {CHL AMB IS/IS NOT:210130109} currently in the action stage of change. As such, her goal is to {MWMwtloss#1:210800005}.  She has agreed to {dwwsldiets:29085}.  Exercise goals: {MWM EXERCISE RECS:23473}  Behavioral modification strategies: {dwwslwtlossstrategies:29088}.  Areyana has agreed to follow-up with our clinic in {NUMBER 1-10:22536} weeks.   No orders of the defined types were placed in this encounter.   There are no discontinued medications.   No orders of the defined types were placed in this encounter.     Objective:   VITALS: Per patient if applicable, see vitals. GENERAL: Alert and in no acute distress. CARDIOPULMONARY: No increased WOB. Speaking in clear sentences.  PSYCH: Pleasant and cooperative. Speech normal rate and rhythm. Affect is appropriate. Insight and judgement are appropriate. Attention is focused, linear, and appropriate.  NEURO: Oriented as arrived to appointment on time with no prompting.   Attestation Statements:    This was prepared with the assistance of Engineer, civil (consulting).  Occasional wrong-word or sound-a-like substitutions may have occurred due to the inherent limitations of voice recognition

## 2023-12-21 NOTE — Progress Notes (Signed)
 WEIGHT SUMMARY AND BIOMETRICS  Weight Lost Since Last Visit: 0  Weight Gained Since Last Visit: 4lb   Vitals Temp: 97.9 F (36.6 C) BP: (!) 153/74 Pulse Rate: 67 SpO2: 99 %   Anthropometric Measurements Height: 5' 2 (1.575 m) Weight: 154 lb (69.9 kg) BMI (Calculated): 28.16 Weight at Last Visit: 150lb Weight Lost Since Last Visit: 0 Weight Gained Since Last Visit: 4lb Starting Weight: 183lb Total Weight Loss (lbs): 29 lb (13.2 kg)   Body Composition  Body Fat %: 45.5 % Fat Mass (lbs): 70.2 lbs Muscle Mass (lbs): 79.8 lbs Visceral Fat Rating : 13   Other Clinical Data Fasting: no Labs: no Today's Visit #: 55 Starting Date: 06/24/19    OBESITY Shannon Bond is here to discuss her progress with her obesity treatment plan along with follow-up of her obesity related diagnoses.    Nutrition Plan: Other Renal diet  - 100% adherence.  Current exercise: walking and core, strength, balance.  Interim History:  Her weight is up 4 lbs since her last visit.  Eating all of the food on the plan., Protein intake is as prescribed, Is not skipping meals, Journaling consistently., and Water intake is adequate.   Pharmacotherapy: Shannon Bond is not on any anti-obesity medications.  Hunger is moderately controlled.  Cravings are moderately controlled.  Assessment/Plan:   Hypertension Hypertension poorly controlled.  Medication(s): Hydrochlorothiazide  25 mg daily , Benicar  20 mg.   She states that her blood pressure has been elevated and was 168/63 at home which is similar to her ever read she states that she was put on amlodipine  5 mg but her blood pressure continued to rise and her provider took her off the medication and then put her on carvedilol .  She was also taken off that medication secondary to her high systolic reading and has now been put on Benicar  20 mg which  she has been on for approximately 1 day.  Shannon Bond has additionally been taking throughout her HCTZ 25 mg daily. She and has the following requirements: 55 g of protein or less daily, minimal phosphorus,  sodium limited to 1500 mg/day and potassium restrictions.  Her provider is managing her blood pressure and we will check back with her in the near future.  BP Readings from Last 3 Encounters:  12/21/23 (!) 153/74  12/19/23 (!) 146/64  11/23/23 (!) 150/64   Lab Results  Component Value Date   CREATININE 0.99 12/19/2023   CREATININE 1.13 (H) 11/20/2023   CREATININE 1.20 (H) 11/15/2023   Lab Results  Component Value Date   GFR 53.16 (L) 12/19/2023   GFR 33.94 (L) 11/08/2023   GFR 55.41 (L) 05/01/2023    Plan: Continue all antihypertensives at current dosages. No added salt. Will keep sodium content to 1,500 mg or less per day.   She will continue to follow-up with her provider and secondary to her  blood pressure.  Will check her blood pressure at home. She will continue to follow the renal diet  Labs reviewed today (BMP (GFR),CBC, glucose).    Generalized Obesity: Current BMI BMI (Calculated): 28.16    Shannon Bond is currently in the action stage of change. As such, her goal is to continue with weight loss efforts.  She has agreed to Other low carb, renal diet.  Exercise goals: Older adults should determine their level of effort for physical activity relative to their level of fitness.  She will increase her resistance training.  Behavioral modification strategies: decreasing simple carbohydrates , no meal skipping, meal planning , increase water intake, better snacking choices, planning for success, increasing fiber rich foods, avoiding temptations, weigh protein portions, measure portion sizes, and mindful eating.  Shannon Bond has agreed to follow-up with our clinic in 4 weeks.       Objective:   VITALS: Per patient if applicable, see vitals. GENERAL: Alert and in no acute  distress. CARDIOPULMONARY: No increased WOB. Speaking in clear sentences.  PSYCH: Pleasant and cooperative. Speech normal rate and rhythm. Affect is appropriate. Insight and judgement are appropriate. Attention is focused, linear, and appropriate.  NEURO: Oriented as arrived to appointment on time with no prompting.   Attestation Statements:   Time spent on visit in care of the patient today including the items listed below was 40 minutes.    10 minutes were spent talking about the history, 20 minutes for face to face counseling implementing the plan, discussing the specifics of how to arrange meals, meal planning, water intake.   I spent face to face time discussing her renal diet.   Discussed the bio-impedence test (fat %, muscle mass, and water weight) and allowed the patient to ask questions.  We discussed minimizing loss of her muscle mass and encouraged more resistance training.   I reviewed the labs which were ordered from her visit on 12/19/2023.   I additionally spent time documenting, reviewing, and checking the codes before submitting.    This was prepared with the assistance of Engineer, civil (consulting).  Occasional wrong-word or sound-a-like substitutions may have occurred due to the inherent limitations of voice recognition

## 2023-12-22 ENCOUNTER — Encounter: Payer: Self-pay | Admitting: Family Medicine

## 2023-12-26 DIAGNOSIS — M545 Low back pain, unspecified: Secondary | ICD-10-CM | POA: Diagnosis not present

## 2023-12-26 DIAGNOSIS — M791 Myalgia, unspecified site: Secondary | ICD-10-CM | POA: Diagnosis not present

## 2023-12-28 ENCOUNTER — Other Ambulatory Visit: Payer: Self-pay | Admitting: Family Medicine

## 2023-12-28 ENCOUNTER — Other Ambulatory Visit (INDEPENDENT_AMBULATORY_CARE_PROVIDER_SITE_OTHER)

## 2023-12-28 ENCOUNTER — Ambulatory Visit: Payer: Self-pay | Admitting: Family Medicine

## 2023-12-28 DIAGNOSIS — I10 Essential (primary) hypertension: Secondary | ICD-10-CM | POA: Diagnosis not present

## 2023-12-28 LAB — BASIC METABOLIC PANEL WITH GFR
BUN: 44 mg/dL — ABNORMAL HIGH (ref 6–23)
CO2: 23 meq/L (ref 19–32)
Calcium: 9.7 mg/dL (ref 8.4–10.5)
Chloride: 101 meq/L (ref 96–112)
Creatinine, Ser: 0.97 mg/dL (ref 0.40–1.20)
GFR: 54.47 mL/min — ABNORMAL LOW (ref >60.00)
Glucose, Bld: 89 mg/dL (ref 70–99)
Potassium: 4.4 meq/L (ref 3.5–5.1)
Sodium: 135 meq/L (ref 135–145)

## 2023-12-29 MED ORDER — COVID-19 MRNA VAC-TRIS(PFIZER) 30 MCG/0.3ML IM SUSY: 0.3000 mL | PREFILLED_SYRINGE | Freq: Once | INTRAMUSCULAR | 0 refills | Status: AC

## 2024-01-02 DIAGNOSIS — M5459 Other low back pain: Secondary | ICD-10-CM | POA: Diagnosis not present

## 2024-01-02 DIAGNOSIS — M6281 Muscle weakness (generalized): Secondary | ICD-10-CM | POA: Diagnosis not present

## 2024-01-02 DIAGNOSIS — M25551 Pain in right hip: Secondary | ICD-10-CM | POA: Diagnosis not present

## 2024-01-04 DIAGNOSIS — M5459 Other low back pain: Secondary | ICD-10-CM | POA: Diagnosis not present

## 2024-01-04 DIAGNOSIS — M6281 Muscle weakness (generalized): Secondary | ICD-10-CM | POA: Diagnosis not present

## 2024-01-04 DIAGNOSIS — M25551 Pain in right hip: Secondary | ICD-10-CM | POA: Diagnosis not present

## 2024-01-05 ENCOUNTER — Other Ambulatory Visit: Payer: Self-pay | Admitting: Family Medicine

## 2024-01-05 ENCOUNTER — Encounter: Payer: Self-pay | Admitting: Family Medicine

## 2024-01-05 MED ORDER — LISINOPRIL 30 MG PO TABS: 30.0000 mg | ORAL_TABLET | Freq: Every day | ORAL | 3 refills | Status: AC

## 2024-01-09 DIAGNOSIS — M25551 Pain in right hip: Secondary | ICD-10-CM | POA: Diagnosis not present

## 2024-01-09 DIAGNOSIS — M5459 Other low back pain: Secondary | ICD-10-CM | POA: Diagnosis not present

## 2024-01-09 DIAGNOSIS — M6281 Muscle weakness (generalized): Secondary | ICD-10-CM | POA: Diagnosis not present

## 2024-01-12 DIAGNOSIS — M25551 Pain in right hip: Secondary | ICD-10-CM | POA: Diagnosis not present

## 2024-01-12 DIAGNOSIS — M5459 Other low back pain: Secondary | ICD-10-CM | POA: Diagnosis not present

## 2024-01-12 DIAGNOSIS — M6281 Muscle weakness (generalized): Secondary | ICD-10-CM | POA: Diagnosis not present

## 2024-01-15 DIAGNOSIS — M25551 Pain in right hip: Secondary | ICD-10-CM | POA: Diagnosis not present

## 2024-01-15 DIAGNOSIS — M6281 Muscle weakness (generalized): Secondary | ICD-10-CM | POA: Diagnosis not present

## 2024-01-15 DIAGNOSIS — M5459 Other low back pain: Secondary | ICD-10-CM | POA: Diagnosis not present

## 2024-01-16 DIAGNOSIS — M5442 Lumbago with sciatica, left side: Secondary | ICD-10-CM | POA: Diagnosis not present

## 2024-01-16 DIAGNOSIS — M791 Myalgia, unspecified site: Secondary | ICD-10-CM | POA: Diagnosis not present

## 2024-01-19 ENCOUNTER — Ambulatory Visit (INDEPENDENT_AMBULATORY_CARE_PROVIDER_SITE_OTHER): Admitting: Family Medicine

## 2024-01-19 ENCOUNTER — Encounter: Payer: Self-pay | Admitting: Family Medicine

## 2024-01-19 VITALS — BP 128/62 | HR 86 | Temp 96.9°F | Resp 16 | Ht 62.0 in | Wt 157.2 lb

## 2024-01-19 DIAGNOSIS — M25551 Pain in right hip: Secondary | ICD-10-CM | POA: Diagnosis not present

## 2024-01-19 DIAGNOSIS — M6281 Muscle weakness (generalized): Secondary | ICD-10-CM | POA: Diagnosis not present

## 2024-01-19 DIAGNOSIS — B351 Tinea unguium: Secondary | ICD-10-CM

## 2024-01-19 DIAGNOSIS — I10 Essential (primary) hypertension: Secondary | ICD-10-CM

## 2024-01-19 DIAGNOSIS — M5459 Other low back pain: Secondary | ICD-10-CM | POA: Diagnosis not present

## 2024-01-19 HISTORY — DX: Tinea unguium: B35.1

## 2024-01-19 MED ORDER — TERBINAFINE HCL 250 MG PO TABS: 250.0000 mg | ORAL_TABLET | Freq: Every day | ORAL | 2 refills | Status: DC

## 2024-01-19 NOTE — Patient Instructions (Signed)
 Give us  2-3 business days to get the results of your labs back.   Keep the diet clean and stay active.  Let us  know if you need anything.

## 2024-01-19 NOTE — Progress Notes (Signed)
 Chief Complaint  Patient presents with   Follow-up    Follow Up    Subjective Shannon Bond is a 83 y.o. female who presents for hypertension follow up. She does monitor home blood pressures. Blood pressures ranging from 130-140's/60's on average. She is compliant with medications-hydrochlorothiazide  25 mg daily, lisinopril  30 mg daily. Patient has these side effects of medication: none She is adhering to a healthy diet overall. Current exercise: HIIT, strength training, walking No CP or SOB.   For little over a year, the patient has had thickened and painful toenails, mainly on the right foot.  Great toe is affected most.  Denies any trauma.  Not obviously ingrown.  There is no drainage or redness.  She has not tried anything so far but is interested in an oral medication.   Past Medical History:  Diagnosis Date   Alcohol abuse    Allergy    Alopecia    Aortic atherosclerosis 03/16/2021   Noted 12/2020 abdominal CT   Arthritis    BMI 27.0-27.9,adult 05/25/2022   Cardiac murmur 07/02/2019   Celiac disease    Chronic laryngitis 07/22/2019   Chronic rhinitis 07/22/2019   Diverticulitis    DOE (dyspnea on exertion) 07/02/2019   Dysphagia 07/22/2019   Edema of both lower extremities    Essential hypertension 01/24/2018   Generalized obesity 01/27/2022   Starting BMI 33.47     GERD (gastroesophageal reflux disease)    History of malignant neoplasm of skin 03/08/2021   Hypertension    Hypertension 03/08/2021   Hyperthyroidism 03/08/2021   Hypothyroidism 01/24/2018   IBS (irritable bowel syndrome) 03/08/2021   Laryngopharyngeal reflux 07/22/2019   Mixed dyslipidemia 10/07/2021   Moderate aortic regurgitation 10/11/2019   Obesity (BMI 30-39.9) 04/01/2019   Osteoarthritis    Osteoporosis 04/01/2019   Other hyperlipidemia 01/03/2022   Preop cardiovascular exam 03/09/2021   Seborrheic dermatitis of scalp 03/08/2021   Sensorineural hearing loss 07/22/2019   Squamous cell  carcinoma in situ 03/08/2021   Subjective tinnitus 07/22/2019   Thyroid  disease    Thyroid  nodule 07/22/2019    Exam BP 128/62 (BP Location: Left Arm, Cuff Size: Normal)   Pulse 86   Temp (!) 96.9 F (36.1 C) (Oral)   Resp 16   Ht 5' 2 (1.575 m)   Wt 157 lb 3.2 oz (71.3 kg)   SpO2 98%   BMI 28.75 kg/m  General:  well developed, well nourished, in no apparent distress Skin: Slight thickening and lysis of the nails on the right foot.  Mild TTP over the nail plate of the skin.  No nail pulp edema, erythema, fluctuance, or drainage. Heart: RRR, no bruits, no LE edema Lungs: clear to auscultation, no accessory muscle use Psych: well oriented with normal range of affect and appropriate judgment/insight  Essential hypertension - Plan: Comprehensive metabolic panel with GFR  Onychomycosis - Plan: Hepatic function panel, Comprehensive metabolic panel with GFR  Chronic, stable.  Continue lisinopril  30 mg daily, hydrochlorothiazide  25 mg daily.  Counseled on diet and exercise. Chronic, unstable.  Start Lamisil 250 mg daily.  Follow-up in 1 month for labs.  Monitor hepatic function. F/u as originally scheduled. The patient voiced understanding and agreement to the plan.  Mabel [place], DO 01/19/24  2:58 PM

## 2024-01-20 ENCOUNTER — Ambulatory Visit: Payer: Self-pay | Admitting: Family Medicine

## 2024-01-20 LAB — COMPREHENSIVE METABOLIC PANEL WITH GFR
AG Ratio: 2 (calc) (ref 1.0–2.5)
ALT: 22 U/L (ref 6–29)
AST: 25 U/L (ref 10–35)
Albumin: 4.3 g/dL (ref 3.6–5.1)
Alkaline phosphatase (APISO): 50 U/L (ref 37–153)
BUN/Creatinine Ratio: 50 (calc) — ABNORMAL HIGH (ref 6–22)
BUN: 53 mg/dL — ABNORMAL HIGH (ref 7–25)
CO2: 23 mmol/L (ref 20–32)
Calcium: 9.3 mg/dL (ref 8.6–10.4)
Chloride: 104 mmol/L (ref 98–110)
Creat: 1.05 mg/dL — ABNORMAL HIGH (ref 0.60–0.95)
Globulin: 2.2 g/dL (ref 1.9–3.7)
Glucose, Bld: 96 mg/dL (ref 65–99)
Potassium: 4.7 mmol/L (ref 3.5–5.3)
Sodium: 135 mmol/L (ref 135–146)
Total Bilirubin: 0.4 mg/dL (ref 0.2–1.2)
Total Protein: 6.5 g/dL (ref 6.1–8.1)
eGFR: 53 mL/min/1.73m2 — ABNORMAL LOW (ref >=60)

## 2024-01-22 ENCOUNTER — Encounter: Payer: Self-pay | Admitting: Bariatrics

## 2024-01-22 ENCOUNTER — Ambulatory Visit: Admitting: Bariatrics

## 2024-01-22 VITALS — BP 135/71 | HR 67 | Temp 97.9°F | Ht 62.0 in | Wt 151.0 lb

## 2024-01-22 DIAGNOSIS — E669 Obesity, unspecified: Secondary | ICD-10-CM

## 2024-01-22 DIAGNOSIS — Z6827 Body mass index (BMI) 27.0-27.9, adult: Secondary | ICD-10-CM | POA: Diagnosis not present

## 2024-01-22 DIAGNOSIS — E782 Mixed hyperlipidemia: Secondary | ICD-10-CM

## 2024-01-22 NOTE — Progress Notes (Signed)
                                                                                                              WEIGHT SUMMARY AND BIOMETRICS  Weight Lost Since Last Visit: 3lb  Weight Gained Since Last Visit: 0   Vitals Temp: 97.9 F (36.6 C) BP: 135/71 Pulse Rate: 67 SpO2: 97 %   Anthropometric Measurements Height: 5' 2 (1.575 m) Weight: 151 lb (68.5 kg) BMI (Calculated): 27.61 Weight at Last Visit: 154lb Weight Lost Since Last Visit: 3lb Weight Gained Since Last Visit: 0 Starting Weight: 183lb Total Weight Loss (lbs): 32 lb (14.5 kg)   Body Composition  Body Fat %: 44.1 % Fat Mass (lbs): 67 lbs Muscle Mass (lbs): 80.4 lbs Total Body Water (lbs): 64.8 lbs Visceral Fat Rating : 13   Other Clinical Data Fasting: no Labs: ni Today's Visit #: 51 Starting Date: 06/24/19    OBESITY Shannon Bond is here to discuss her progress with her obesity treatment plan along with follow-up of her obesity related diagnoses.    Nutrition Plan: Other low carb, renal diet - 100% adherence.  Current exercise: walking and physical therapy  Interim History:  She is down 3 lbs since her last visit. Her blood pressure had been up, but has improved.  Eating all of the food on the plan., Protein intake is as prescribed, Is not skipping meals, and Water intake is adequate.   Pharmacotherapy: Shannon Bond is not on any anti-obesity medications.  Hunger is moderately controlled.  Cravings are moderately controlled.  Assessment/Plan:   Mixed Dyslipidemia:   She is on Crestor  and taking as directed.   Plan:  Will continue the Crestor .  Will continue her walking and exercise. She will work on maintaining and building muscle.    Generalized Obesity: Current BMI BMI (Calculated): 27.61    Shannon Bond is currently in the action stage of change. As such, her goal is to continue with weight loss efforts.  She has agreed to low carbohydrate plan, renal diet.   Exercise goals: Older adults  should determine their level of effort for physical activity relative to their level of fitness.   Behavioral modification strategies: increasing lean protein intake, no meal skipping, meal planning , better snacking choices, planning for success, avoiding temptations, weigh protein portions, and measure portion sizes.  Shannon Bond has agreed to follow-up with our clinic in 4 weeks.     Objective:   VITALS: Per patient if applicable, see vitals. GENERAL: Alert and in no acute distress. CARDIOPULMONARY: No increased WOB. Speaking in clear sentences.  PSYCH: Pleasant and cooperative. Speech normal rate and rhythm. Affect is appropriate. Insight and judgement are appropriate. Attention is focused, linear, and appropriate.  NEURO: Oriented as arrived to appointment on time with no prompting.   Attestation Statements:   This was prepared with the assistance of Engineer, civil (consulting).  Occasional wrong-word or sound-a-like substitutions may have occurred due to the inherent limitations of voice recognition   Clayborne Daring, DO

## 2024-01-24 DIAGNOSIS — M25551 Pain in right hip: Secondary | ICD-10-CM | POA: Diagnosis not present

## 2024-01-24 DIAGNOSIS — M5459 Other low back pain: Secondary | ICD-10-CM | POA: Diagnosis not present

## 2024-01-24 DIAGNOSIS — M6281 Muscle weakness (generalized): Secondary | ICD-10-CM | POA: Diagnosis not present

## 2024-01-26 DIAGNOSIS — M25551 Pain in right hip: Secondary | ICD-10-CM | POA: Diagnosis not present

## 2024-01-26 DIAGNOSIS — M5459 Other low back pain: Secondary | ICD-10-CM | POA: Diagnosis not present

## 2024-01-26 DIAGNOSIS — M6281 Muscle weakness (generalized): Secondary | ICD-10-CM | POA: Diagnosis not present

## 2024-01-29 DIAGNOSIS — M25551 Pain in right hip: Secondary | ICD-10-CM | POA: Diagnosis not present

## 2024-01-29 DIAGNOSIS — M5459 Other low back pain: Secondary | ICD-10-CM | POA: Diagnosis not present

## 2024-01-29 DIAGNOSIS — M6281 Muscle weakness (generalized): Secondary | ICD-10-CM | POA: Diagnosis not present

## 2024-02-02 DIAGNOSIS — M25551 Pain in right hip: Secondary | ICD-10-CM | POA: Diagnosis not present

## 2024-02-02 DIAGNOSIS — M5459 Other low back pain: Secondary | ICD-10-CM | POA: Diagnosis not present

## 2024-02-02 DIAGNOSIS — M6281 Muscle weakness (generalized): Secondary | ICD-10-CM | POA: Diagnosis not present

## 2024-02-05 DIAGNOSIS — M5459 Other low back pain: Secondary | ICD-10-CM | POA: Diagnosis not present

## 2024-02-05 DIAGNOSIS — M6281 Muscle weakness (generalized): Secondary | ICD-10-CM | POA: Diagnosis not present

## 2024-02-05 DIAGNOSIS — M25551 Pain in right hip: Secondary | ICD-10-CM | POA: Diagnosis not present

## 2024-02-08 ENCOUNTER — Other Ambulatory Visit: Payer: Self-pay | Admitting: Family Medicine

## 2024-02-09 DIAGNOSIS — M25551 Pain in right hip: Secondary | ICD-10-CM | POA: Diagnosis not present

## 2024-02-09 DIAGNOSIS — M5459 Other low back pain: Secondary | ICD-10-CM | POA: Diagnosis not present

## 2024-02-09 DIAGNOSIS — M6281 Muscle weakness (generalized): Secondary | ICD-10-CM | POA: Diagnosis not present

## 2024-02-12 DIAGNOSIS — M6281 Muscle weakness (generalized): Secondary | ICD-10-CM | POA: Diagnosis not present

## 2024-02-12 DIAGNOSIS — M25551 Pain in right hip: Secondary | ICD-10-CM | POA: Diagnosis not present

## 2024-02-12 DIAGNOSIS — M5459 Other low back pain: Secondary | ICD-10-CM | POA: Diagnosis not present

## 2024-02-13 DIAGNOSIS — M533 Sacrococcygeal disorders, not elsewhere classified: Secondary | ICD-10-CM | POA: Diagnosis not present

## 2024-02-13 DIAGNOSIS — M5459 Other low back pain: Secondary | ICD-10-CM | POA: Diagnosis not present

## 2024-02-16 DIAGNOSIS — M6281 Muscle weakness (generalized): Secondary | ICD-10-CM | POA: Diagnosis not present

## 2024-02-16 DIAGNOSIS — M5459 Other low back pain: Secondary | ICD-10-CM | POA: Diagnosis not present

## 2024-02-16 DIAGNOSIS — M25551 Pain in right hip: Secondary | ICD-10-CM | POA: Diagnosis not present

## 2024-02-19 ENCOUNTER — Other Ambulatory Visit

## 2024-02-19 DIAGNOSIS — B351 Tinea unguium: Secondary | ICD-10-CM | POA: Diagnosis not present

## 2024-02-19 DIAGNOSIS — M5459 Other low back pain: Secondary | ICD-10-CM | POA: Diagnosis not present

## 2024-02-19 DIAGNOSIS — M25551 Pain in right hip: Secondary | ICD-10-CM | POA: Diagnosis not present

## 2024-02-19 DIAGNOSIS — M6281 Muscle weakness (generalized): Secondary | ICD-10-CM | POA: Diagnosis not present

## 2024-02-19 LAB — HEPATIC FUNCTION PANEL
ALT: 13 U/L (ref 0–35)
AST: 19 U/L (ref 0–37)
Albumin: 4.1 g/dL (ref 3.5–5.2)
Alkaline Phosphatase: 42 U/L (ref 39–117)
Bilirubin, Direct: 0.1 mg/dL (ref 0.0–0.3)
Total Bilirubin: 0.5 mg/dL (ref 0.2–1.2)
Total Protein: 6.2 g/dL (ref 6.0–8.3)

## 2024-02-20 ENCOUNTER — Ambulatory Visit (INDEPENDENT_AMBULATORY_CARE_PROVIDER_SITE_OTHER): Admitting: Bariatrics

## 2024-02-20 ENCOUNTER — Encounter: Payer: Self-pay | Admitting: Bariatrics

## 2024-02-20 VITALS — BP 119/70 | HR 73 | Temp 98.0°F | Ht 62.0 in | Wt 152.0 lb

## 2024-02-20 DIAGNOSIS — E782 Mixed hyperlipidemia: Secondary | ICD-10-CM

## 2024-02-20 DIAGNOSIS — E039 Hypothyroidism, unspecified: Secondary | ICD-10-CM

## 2024-02-20 DIAGNOSIS — Z6827 Body mass index (BMI) 27.0-27.9, adult: Secondary | ICD-10-CM

## 2024-02-20 DIAGNOSIS — E669 Obesity, unspecified: Secondary | ICD-10-CM | POA: Diagnosis not present

## 2024-02-20 DIAGNOSIS — E038 Other specified hypothyroidism: Secondary | ICD-10-CM

## 2024-02-20 NOTE — Progress Notes (Signed)
 WEIGHT SUMMARY AND BIOMETRICS  Weight Lost Since Last Visit: 0  Weight Gained Since Last Visit: 1lb   Vitals Temp: 98 F (36.7 C) BP: 119/70 Pulse Rate: 73 SpO2: 97 %   Anthropometric Measurements Height: 5' 2 (1.575 m) Weight: 152 lb (68.9 kg) BMI (Calculated): 27.79 Weight at Last Visit: 151lb Weight Lost Since Last Visit: 0 Weight Gained Since Last Visit: 1lb Starting Weight: 183lb Total Weight Loss (lbs): 31 lb (14.1 kg)   Body Composition  Body Fat %: 44.6 % Fat Mass (lbs): 68.2 lbs Muscle Mass (lbs): 80.2 lbs Total Body Water (lbs): 67.6 lbs Visceral Fat Rating : 13   Other Clinical Data Fasting: no Labs: no Today's Visit #: 52 Starting Date: 06/24/19    OBESITY Sarajean is here to discuss her progress with her obesity treatment plan along with follow-up of her obesity related diagnoses.    Nutrition Plan: low carbohydrate plan/ renal diet - 99% adherence.  Current exercise: walking and physical therapy  Interim History:  She is up 1 lb since her last visit. According to the bio-impedence scale, she is up 2 and 1/2 lbs. She is on a diuretic.  Eating all of the food on the plan., Protein intake is as prescribed, Is not skipping meals, and Water intake is adequate.   Pharmacotherapy: Cieara is on no anti-obesity medications.  Hunger is well controlled.  Cravings are moderately controlled. She craves bread sometimes.  Assessment/Plan:   Mixed dyslipidemia: LDL is at goal. Medication(s): Crestor   Cardiovascular risk factors: advanced age (older than 12 for men, 34 for women), dyslipidemia, obesity (BMI >= 30 kg/m2), and sedentary lifestyle  Lab Results  Component Value Date   CHOL 133 11/08/2023   HDL 52.70 11/08/2023   LDLCALC 47 11/08/2023   TRIG 166.0 (H) 11/08/2023   CHOLHDL 3 11/08/2023   Lab Results  Component Value Date    ALT 13 02/19/2024   AST 19 02/19/2024   ALKPHOS 42 02/19/2024   BILITOT 0.5 02/19/2024   The ASCVD Risk score (Arnett DK, et al., 2019) failed to calculate for the following reasons:   The 2019 ASCVD risk score is only valid for ages 15 to 18  Plan:  Continue statin.  Will avoid all trans fats.  Will read labels Increase Omega 3 in foods, and consider an Omega 3 supplement.  Continue PT.  Will elevate her feet above the level of her heart.  Will drink adequate water.   Hypothyroidism Stable.  Does not report symptoms associated with uncontrolled hypothyroidism. Medication(s): Levothyroxine  112 mcg daily. Lab Results  Component Value Date   TSH 4.80 11/08/2023    Plan: Continue levothyroxine  at current dose. Counseling: The correct way to take levothyroxine  is fasting, with water, separated by at least 30 minutes from breakfast, and separated by more than 4 hours from calcium , iron, multivitamins, acid reflux medications (  PPIs).     Generalized Obesity: Current BMI BMI (Calculated): 27.79    Ireta is currently in the action stage of change. As such, her goal is to continue with weight loss efforts.  She has agreed to low carbohydrate plan.  Exercise goals: Older adults should determine their level of effort for physical activity relative to their level of fitness.  She has PT twice a week for back pain.   Behavioral modification strategies: increasing lean protein intake, decreasing simple carbohydrates , no meal skipping, meal planning , increase water intake, better snacking choices, planning for success, increasing vegetables, increasing fiber rich foods, keep healthy foods in the home, increase frequency of journaling, weigh protein portions, and measure portion sizes.  Zarria has agreed to follow-up with our clinic in 4 weeks.    Objective:   VITALS: Per patient if applicable, see vitals. GENERAL: Alert and in no acute distress. CARDIOPULMONARY: No increased  WOB. Speaking in clear sentences.  PSYCH: Pleasant and cooperative. Speech normal rate and rhythm. Affect is appropriate. Insight and judgement are appropriate. Attention is focused, linear, and appropriate.  NEURO: Oriented as arrived to appointment on time with no prompting.   Attestation Statements:   This was prepared with the assistance of Engineer, Civil (consulting).  Occasional wrong-word or sound-a-like substitutions may have occurred due to the inherent limitations of voice recognition   Clayborne Daring, DO

## 2024-02-23 DIAGNOSIS — M25551 Pain in right hip: Secondary | ICD-10-CM | POA: Diagnosis not present

## 2024-02-23 DIAGNOSIS — M6281 Muscle weakness (generalized): Secondary | ICD-10-CM | POA: Diagnosis not present

## 2024-02-23 DIAGNOSIS — M5459 Other low back pain: Secondary | ICD-10-CM | POA: Diagnosis not present

## 2024-02-26 DIAGNOSIS — M25551 Pain in right hip: Secondary | ICD-10-CM | POA: Diagnosis not present

## 2024-02-26 DIAGNOSIS — M6281 Muscle weakness (generalized): Secondary | ICD-10-CM | POA: Diagnosis not present

## 2024-02-26 DIAGNOSIS — M5459 Other low back pain: Secondary | ICD-10-CM | POA: Diagnosis not present

## 2024-02-27 DIAGNOSIS — D229 Melanocytic nevi, unspecified: Secondary | ICD-10-CM | POA: Diagnosis not present

## 2024-02-27 DIAGNOSIS — L570 Actinic keratosis: Secondary | ICD-10-CM | POA: Diagnosis not present

## 2024-02-27 DIAGNOSIS — L814 Other melanin hyperpigmentation: Secondary | ICD-10-CM | POA: Diagnosis not present

## 2024-02-27 DIAGNOSIS — L821 Other seborrheic keratosis: Secondary | ICD-10-CM | POA: Diagnosis not present

## 2024-02-27 DIAGNOSIS — L578 Other skin changes due to chronic exposure to nonionizing radiation: Secondary | ICD-10-CM | POA: Diagnosis not present

## 2024-02-27 DIAGNOSIS — Z86018 Personal history of other benign neoplasm: Secondary | ICD-10-CM | POA: Diagnosis not present

## 2024-02-27 DIAGNOSIS — Z85828 Personal history of other malignant neoplasm of skin: Secondary | ICD-10-CM | POA: Diagnosis not present

## 2024-02-27 DIAGNOSIS — D1801 Hemangioma of skin and subcutaneous tissue: Secondary | ICD-10-CM | POA: Diagnosis not present

## 2024-03-01 ENCOUNTER — Other Ambulatory Visit: Payer: Self-pay | Admitting: Family Medicine

## 2024-03-01 DIAGNOSIS — I10 Essential (primary) hypertension: Secondary | ICD-10-CM

## 2024-03-01 DIAGNOSIS — M25551 Pain in right hip: Secondary | ICD-10-CM | POA: Diagnosis not present

## 2024-03-01 DIAGNOSIS — M6281 Muscle weakness (generalized): Secondary | ICD-10-CM | POA: Diagnosis not present

## 2024-03-01 DIAGNOSIS — M5459 Other low back pain: Secondary | ICD-10-CM | POA: Diagnosis not present

## 2024-03-15 ENCOUNTER — Other Ambulatory Visit: Payer: Self-pay | Admitting: Family Medicine

## 2024-03-19 ENCOUNTER — Encounter: Payer: Self-pay | Admitting: Cardiology

## 2024-03-19 ENCOUNTER — Encounter: Payer: Self-pay | Admitting: Family Medicine

## 2024-03-19 ENCOUNTER — Ambulatory Visit: Attending: Cardiology | Admitting: Cardiology

## 2024-03-19 VITALS — BP 118/54 | HR 68 | Ht 62.0 in | Wt 159.8 lb

## 2024-03-19 DIAGNOSIS — I700 Atherosclerosis of aorta: Secondary | ICD-10-CM | POA: Diagnosis not present

## 2024-03-19 DIAGNOSIS — I351 Nonrheumatic aortic (valve) insufficiency: Secondary | ICD-10-CM | POA: Diagnosis not present

## 2024-03-19 DIAGNOSIS — E782 Mixed hyperlipidemia: Secondary | ICD-10-CM | POA: Diagnosis not present

## 2024-03-19 DIAGNOSIS — E669 Obesity, unspecified: Secondary | ICD-10-CM | POA: Diagnosis not present

## 2024-03-19 DIAGNOSIS — I10 Essential (primary) hypertension: Secondary | ICD-10-CM | POA: Diagnosis not present

## 2024-03-19 NOTE — Patient Instructions (Signed)

## 2024-03-19 NOTE — Progress Notes (Signed)
 Cardiology Office Note:    Date:  03/19/2024   ID:  Shannon Bond, DOB Sep 11, 1940, MRN [id number]  PCP:  Frann Mabel Mt, DO  Cardiologist:  Jennifer JONELLE Crape, MD   Referring MD: Frann Mabel Mt*    ASSESSMENT:    1. Aortic atherosclerosis   2. Essential hypertension   3. Obesity (BMI 30-39.9)   4. Mixed dyslipidemia   5. Mild aortic regurgitation    PLAN:    In order of problems listed above:  Aortic atherosclerosis: Secondary prevention stressed with the patient.  Importance of compliance with diet medication stressed and patient verbalized standing. She was advised to ambulate to the best of her ability. Essential hypertension: Stable and diet was emphasized. Mixed dyslipidemia: At goal.  Lipid-lowering medications were reviewed and she is compliant with them. Mild aortic regurgitation: Stable and she is happy about it.  We will continue to monitor. Obesity: Weight reduction stressed diet emphasized and she promises to do better. Patient will be seen in follow-up appointment in 6 months or earlier if the patient has any concerns.  Medication Adjustments/Labs and Tests Ordered: Current medicines are reviewed at length with the patient today.  Concerns regarding medicines are outlined above.  No orders of the defined types were placed in this encounter.  No orders of the defined types were placed in this encounter.    No chief complaint on file.    History of Present Illness:    Shannon Bond is a 83 y.o. female.  Patient has past medical history of aortic atherosclerosis, essential hypertension, mixed dyslipidemia and mild aortic regurgitation.  She ambulates age appropriately.  She denies any chest pain orthopnea or PND.  At the time of my evaluation, the patient is alert awake oriented and in no distress.  Past Medical History:  Diagnosis Date   Alcohol abuse    Allergy    Alopecia    Aortic atherosclerosis 03/16/2021   Noted 12/2020 abdominal CT    Arthritis    BMI 27.0-27.9,adult 05/25/2022   Celiac disease    Chronic laryngitis 07/22/2019   Chronic rhinitis 07/22/2019   Diverticulitis    DOE (dyspnea on exertion) 07/02/2019   Dysphagia 07/22/2019   Edema of both lower extremities    Essential hypertension 01/24/2018   Generalized obesity 01/27/2022   Starting BMI 33.47     GERD (gastroesophageal reflux disease)    History of malignant neoplasm of skin 03/08/2021   Hyperthyroidism 03/08/2021   Hypothyroidism 01/24/2018   IBS (irritable bowel syndrome) 03/08/2021   Laryngopharyngeal reflux 07/22/2019   Mixed dyslipidemia 10/07/2021   Moderate aortic regurgitation 10/11/2019   Obesity (BMI 30-39.9) 04/01/2019   Onychomycosis 01/19/2024   Osteoarthritis    Osteoporosis 04/01/2019   Other hyperlipidemia 01/03/2022   Preop cardiovascular exam 03/09/2021   Seborrheic dermatitis of scalp 03/08/2021   Sensorineural hearing loss 07/22/2019   Squamous cell carcinoma in situ 03/08/2021   Subjective tinnitus 07/22/2019   Thyroid  disease    Thyroid  nodule 07/22/2019    Past Surgical History:  Procedure Laterality Date   APPENDECTOMY  1954   CERVICAL CONE BIOPSY  1965 and 1994   COLONOSCOPY     2007 said 13 years ago. Said that she does the hemoocult cards test at home    ESOPHAGOGASTRODUODENOSCOPY     2014   REPLACEMENT TOTAL KNEE BILATERAL  2003    Current Medications: Current Meds  Medication Sig   acetaminophen (TYLENOL) 325 MG tablet Take 650 mg by mouth every 6 (  six) hours as needed for headache.   alendronate  (FOSAMAX ) 70 MG tablet TAKE 1 TABLET BY MOUTH ONCE A WEEK. TAKE WITH A FULL GLASS OF WATER ON AN EMPTY STOMACH   estradiol  (ESTRACE ) 0.1 MG/GM vaginal cream PLACE 1 APPLICATORFUL VAGINALLY AT BEDTIME.   Famotidine (PEPCID AC PO) Take 1 tablet by mouth daily.   hydrochlorothiazide  (HYDRODIURIL ) 25 MG tablet TAKE 1 TABLET BY MOUTH TWICE A DAY   ipratropium (ATROVENT ) 0.06 % nasal spray Place 3 sprays into  both nostrils as needed for rhinitis.   lactobacillus acidophilus (BACID) TABS tablet Take 2 tablets by mouth daily.   levothyroxine  (SYNTHROID ) 112 MCG tablet TAKE 1 TABLET BY MOUTH DAILY BEFORE BREAKFAST.   lisinopril  (ZESTRIL ) 30 MG tablet Take 1 tablet (30 mg total) by mouth daily.   Melatonin-Pyridoxine (MELATIN) 3-1 MG TABS Take 1 tablet by mouth at bedtime.   Multiple Vitamin (MULTIVITAMIN WITH MINERALS) TABS tablet Take 1 tablet by mouth daily.   MYRBETRIQ  25 MG TB24 tablet TAKE 1 TABLET (25 MG TOTAL) BY MOUTH DAILY.   PAPAYA ENZYME PO Take 1 capsule by mouth daily.   rosuvastatin  (CRESTOR ) 20 MG tablet TAKE 1 TABLET BY MOUTH EVERY DAY   S-Adenosylmethionine (SAM-E) 400 MG TABS Take 400 mg by mouth 2 (two) times daily.   terbinafine  (LAMISIL ) 250 MG tablet Take 1 tablet (250 mg total) by mouth daily.   Triamcinolone Acetonide (NASACORT ALLERGY 24HR NA) Place 1-2 sprays into the nose as needed for allergies or rhinitis.     Allergies:   Bactrim [sulfamethoxazole-trimethoprim], Beta adrenergic blockers, Chlorine, Latex, Macrobid [nitrofurantoin], Nsaids, and Penicillins   Social History   Socioeconomic History   Marital status: Married    Spouse name: Wilfored   Number of children: 3   Years of education: Not on file   Highest education level: Bachelor's degree (e.g., BA, AB, BS)  Occupational History   Occupation: Retired   Tobacco Use   Smoking status: Never   Smokeless tobacco: Never   Tobacco comments:    admit to trying to smoke   Vaping Use   Vaping status: Never Used  Substance and Sexual Activity   Alcohol use: Not Currently    Comment: Patient is a alcoholic   Drug use: Never   Sexual activity: Not on file  Other Topics Concern   Not on file  Social History Narrative   Not on file   Social Drivers of Health   Financial Resource Strain: Low Risk  (11/02/2023)   Overall Financial Resource Strain (CARDIA)    Difficulty of Paying Living Expenses: Not hard at  all  Food Insecurity: No Food Insecurity (11/02/2023)   Hunger Vital Sign    Worried About Running Out of Food in the Last Year: Never true    Ran Out of Food in the Last Year: Never true  Transportation Needs: No Transportation Needs (11/02/2023)   PRAPARE - Administrator, Civil Service (Medical): No    Lack of Transportation (Non-Medical): No  Physical Activity: Sufficiently Active (11/02/2023)   Exercise Vital Sign    Days of Exercise per Week: 7 days    Minutes of Exercise per Session: 60 min  Stress: Stress Concern Present (11/02/2023)   [employer] of Occupational Health - Occupational Stress Questionnaire    Feeling of Stress: Rather much  Social Connections: Moderately Integrated (11/02/2023)   Social Connection and Isolation Panel    Frequency of Communication with Friends and Family: More than three times a  week    Frequency of Social Gatherings with Friends and Family: More than three times a week    Attends Religious Services: Never    Database Administrator or Organizations: Yes    Attends Engineer, Structural: More than 4 times per year    Marital Status: Married     Family History: The patient's family history includes AAA (abdominal aortic aneurysm) in her mother; Alcohol abuse in her father; Breast cancer in her maternal grandmother; Cancer in her maternal grandmother; Diabetes in her son; Early death in her father and mother; Hypertension in her mother; Bond cancer in her father; Mental illness in her maternal grandfather. There is no history of Esophageal cancer, Colon cancer, Rectal cancer, or Stomach cancer.  ROS:   Please see the history of present illness.    All other systems reviewed and are negative.  EKGs/Labs/Other Studies Reviewed:    The following studies were reviewed today: .[name]   I discussed my findings with the patient at length   Recent Labs: 11/08/2023: TSH 4.80 12/19/2023: Hemoglobin 11.9; Platelets 241.0 01/19/2024: BUN  53; Creat 1.05; Potassium 4.7; Sodium 135 02/19/2024: ALT 13  Recent Lipid Panel    Component Value Date/Time   CHOL 133 11/08/2023 0940   TRIG 166.0 (H) 11/08/2023 0940   HDL 52.70 11/08/2023 0940   CHOLHDL 3 11/08/2023 0940   VLDL 33.2 11/08/2023 0940   LDLCALC 47 11/08/2023 0940    Physical Exam:    VS:  BP (!) 118/54   Pulse 68   Ht 5' 2 (1.575 m)   Wt 159 lb 12.8 oz (72.5 kg)   SpO2 96%   BMI 29.23 kg/m     Wt Readings from Last 3 Encounters:  03/19/24 159 lb 12.8 oz (72.5 kg)  02/20/24 152 lb (68.9 kg)  01/22/24 151 lb (68.5 kg)     GEN: Patient is in no acute distress HEENT: Normal NECK: No JVD; No carotid bruits LYMPHATICS: No lymphadenopathy CARDIAC: Hear sounds regular, 2/6 systolic murmur at the apex. RESPIRATORY:  Clear to auscultation without rales, wheezing or rhonchi  ABDOMEN: Soft, non-tender, non-distended MUSCULOSKELETAL:  No edema; No deformity  SKIN: Warm and dry NEUROLOGIC:  Alert and oriented x 3 PSYCHIATRIC:  Normal affect   Signed, Jennifer JONELLE Crape, MD  03/19/2024 10:40 AM    [facility]

## 2024-03-20 ENCOUNTER — Other Ambulatory Visit: Payer: Self-pay

## 2024-03-20 MED ORDER — GABAPENTIN 100 MG PO CAPS: 100.0000 mg | ORAL_CAPSULE | Freq: Every day | ORAL | 3 refills | Status: DC

## 2024-03-21 ENCOUNTER — Encounter: Payer: Self-pay | Admitting: Bariatrics

## 2024-03-21 ENCOUNTER — Ambulatory Visit: Admitting: Bariatrics

## 2024-03-21 VITALS — BP 133/71 | HR 72 | Ht 62.0 in | Wt 154.0 lb

## 2024-03-21 DIAGNOSIS — Z6828 Body mass index (BMI) 28.0-28.9, adult: Secondary | ICD-10-CM | POA: Diagnosis not present

## 2024-03-21 DIAGNOSIS — E039 Hypothyroidism, unspecified: Secondary | ICD-10-CM

## 2024-03-21 DIAGNOSIS — E669 Obesity, unspecified: Secondary | ICD-10-CM | POA: Diagnosis not present

## 2024-03-21 DIAGNOSIS — E038 Other specified hypothyroidism: Secondary | ICD-10-CM

## 2024-03-21 NOTE — Progress Notes (Signed)
 WEIGHT SUMMARY AND BIOMETRICS  Weight Lost Since Last Visit: 0  Weight Gained Since Last Visit: 2lb   Vitals BP: 133/71 Pulse Rate: 72 SpO2: 98 %   Anthropometric Measurements Height: 5' 2 (1.575 m) Weight: 154 lb (69.9 kg) BMI (Calculated): 28.16 Weight at Last Visit: 152lb Weight Lost Since Last Visit: 0 Weight Gained Since Last Visit: 2lb Starting Weight: 183lb Total Weight Loss (lbs): 29 lb (13.2 kg)   Body Composition  Body Fat %: 44.7 % Fat Mass (lbs): 69.2 lbs Muscle Mass (lbs): 81 lbs Total Body Water (lbs): 70 lbs Visceral Fat Rating : 13   Other Clinical Data Fasting: no Labs: no Today's Visit #: 42 Starting Date: 06/24/19    OBESITY Ania is here to discuss her progress with her obesity treatment plan along with follow-up of her obesity related diagnoses.    Nutrition Plan: low carbohydrate plan / renal diet- 100% adherence.  Current exercise: Physical therapy exercises, strength training and walking  Interim History:  She is up 2 lbs since her last visit.  Eating all of the food on the plan., Protein intake is as prescribed, Is not skipping meals, and Water intake is adequate.   Pharmacotherapy: Lenoir is not on any anti-obesity medications.  Hunger is moderately controlled.  Cravings are moderately controlled.  Assessment/Plan:   Hypothyroidism Stable.  Does not report symptoms associated with uncontrolled hypothyroidism. Medication(s): Levothyroxine  112 mcg daily. Lab Results  Component Value Date   TSH 4.80 11/08/2023    Plan: Continue levothyroxine  at current dose. Counseling: The correct way to take levothyroxine  is fasting, with water, separated by at least 30 minutes from breakfast, and separated by more than 4 hours from calcium , iron, multivitamins, acid reflux medications (PPIs).  Will continue healthy, high  protein snacks.  Will keep her water intake high.      Generalized Obesity: Current BMI BMI (Calculated): 28.16   Pharmacotherapy Plan She is not on any anti-obesity medications.   Jamillia is currently in the action stage of change. As such, her goal is to continue with weight loss efforts.  She has agreed to low carbohydrate plan.  Exercise goals: Older adults should determine their level of effort for physical activity relative to their level of fitness.   Behavioral modification strategies: increasing lean protein intake, no meal skipping, meal planning , increase water intake, better snacking choices, planning for success, increasing vegetables, increasing fiber rich foods, avoiding temptations, and keep healthy foods in the home.  Christabel has agreed to follow-up with our clinic in 4 weeks.      Objective:   VITALS: Per patient if applicable, see vitals. GENERAL: Alert and in no acute distress. CARDIOPULMONARY: No increased WOB. Speaking in clear sentences.  PSYCH: Pleasant and cooperative. Speech normal rate and rhythm. Affect is appropriate. Insight and judgement are appropriate. Attention is focused, linear, and appropriate.  NEURO: Oriented as arrived to appointment on time with no prompting.   Attestation Statements:   This was prepared with the assistance of Engineer, Civil (consulting).  Occasional wrong-word or sound-a-like substitutions may have occurred due to the inherent limitations of voice recognition.  Clayborne Daring, DO

## 2024-03-26 DIAGNOSIS — M545 Low back pain, unspecified: Secondary | ICD-10-CM | POA: Diagnosis not present

## 2024-04-02 ENCOUNTER — Encounter: Payer: Self-pay | Admitting: Family Medicine

## 2024-04-08 ENCOUNTER — Other Ambulatory Visit: Payer: Self-pay | Admitting: Family Medicine

## 2024-04-22 ENCOUNTER — Encounter: Payer: Self-pay | Admitting: Bariatrics

## 2024-04-22 ENCOUNTER — Ambulatory Visit: Admitting: Bariatrics

## 2024-04-22 VITALS — BP 138/62 | HR 70 | Ht 62.0 in | Wt 154.0 lb

## 2024-04-22 DIAGNOSIS — Z6828 Body mass index (BMI) 28.0-28.9, adult: Secondary | ICD-10-CM

## 2024-04-22 DIAGNOSIS — E669 Obesity, unspecified: Secondary | ICD-10-CM

## 2024-04-22 DIAGNOSIS — I10 Essential (primary) hypertension: Secondary | ICD-10-CM | POA: Diagnosis not present

## 2024-04-22 NOTE — Progress Notes (Signed)
 "                                                                                                             WEIGHT SUMMARY AND BIOMETRICS  Weight Lost Since Last Visit: 0  Weight Gained Since Last Visit: 0   Vitals BP: 138/62 Pulse Rate: 70 SpO2: 100 %   Anthropometric Measurements Height: 5' 2 (1.575 m) Weight: 154 lb (69.9 kg) BMI (Calculated): 28.16 Weight at Last Visit: 154lb Weight Lost Since Last Visit: 0 Weight Gained Since Last Visit: 0 Starting Weight: 183lb Total Weight Loss (lbs): 29 lb (13.2 kg)   Body Composition  Body Fat %: 45.2 % Fat Mass (lbs): 69.6 lbs Muscle Mass (lbs): 80 lbs Visceral Fat Rating : 13   Other Clinical Data Fasting: no Labs: no Today's Visit #: 79 Starting Date: 06/24/19    OBESITY Shannon Bond is here to discuss her progress with her obesity treatment plan along with follow-up of her obesity related diagnoses.    Nutrition Plan: low carbohydrate plan and Other renal diet - 100% adherence.  Current exercise: walking and physical therapy exercises  Interim History:  Her weight remains the same. The bio-impedence scale shows that her muscle mass is down 1 lbs and her water in up 0.4 lbs.  Eating all of the food on the plan., Protein intake is as prescribed, Is not skipping meals, and Water intake is adequate.   Pharmacotherapy: Shannon Bond is not on any anti-obesity medications.  Hunger is moderately controlled.  Cravings are moderately controlled.  Assessment/Plan:   Hypertension Hypertension stable.  Medication(s): Lisinopril  30, and hydrochlorothiazide  25 mg.   BP Readings from Last 3 Encounters:  04/22/24 138/62  03/21/24 133/71  03/19/24 (!) 118/54   Lab Results  Component Value Date   CREATININE 1.05 (H) 01/19/2024   CREATININE 0.97 12/28/2023   CREATININE 0.99 12/19/2023   Lab Results  Component Value Date   GFR 54.47 (L) 12/28/2023   GFR 53.16 (L) 12/19/2023   GFR 33.94 (L) 11/08/2023     Plan: Continue all antihypertensives at current dosages. No added salt. Will keep sodium content to 1,500 mg or less per day.   Discussed ways to minimize salt including rinsing any foods that are canned. Will continue to do a variety of exercises, such as chair exercises, stretching, walking and others. Discussed healthy fats and information sheet given.   Generalized Obesity: Current BMI BMI (Calculated): 28.16   Pharmacotherapy Plan No anti-obesity medications.   Shannon Bond is currently in the action stage of change. As such, her goal is to continue with weight loss efforts.  She has agreed to low carbohydrate plan (renal diet, mostly plant based protein).   Exercise goals: All adults should avoid inactivity. Some physical activity is better than none, and adults who participate in any amount of physical activity gain some health benefits.  Behavioral modification strategies: increasing lean protein intake, no meal skipping, meal planning , increase water intake, better snacking choices, planning for success, increasing vegetables, increasing fiber rich foods, decreasing sodium  intake, keep healthy foods in the home, weigh protein portions, measure portion sizes, and work on smaller portions.  Shannon Bond has agreed to follow-up with our clinic in 4 weeks.     Objective:   VITALS: Per patient if applicable, see vitals. GENERAL: Alert and in no acute distress. CARDIOPULMONARY: No increased WOB. Speaking in clear sentences.  PSYCH: Pleasant and cooperative. Speech normal rate and rhythm. Affect is appropriate. Insight and judgement are appropriate. Attention is focused, linear, and appropriate.  NEURO: Oriented as arrived to appointment on time with no prompting.   Attestation Statements:   This was prepared with the assistance of Engineer, Civil (consulting).  Occasional wrong-word or sound-a-like substitutions may have occurred due to the inherent limitations of voice recognition.    Clayborne Daring, DO    "

## 2024-05-05 ENCOUNTER — Other Ambulatory Visit: Payer: Self-pay | Admitting: Family Medicine

## 2024-05-10 ENCOUNTER — Ambulatory Visit: Payer: Self-pay | Admitting: Family Medicine

## 2024-05-10 ENCOUNTER — Ambulatory Visit: Admitting: Family Medicine

## 2024-05-10 ENCOUNTER — Encounter: Payer: Self-pay | Admitting: Family Medicine

## 2024-05-10 ENCOUNTER — Other Ambulatory Visit: Payer: Self-pay

## 2024-05-10 VITALS — BP 134/68 | HR 85 | Temp 98.0°F | Resp 16 | Ht 62.0 in | Wt 159.0 lb

## 2024-05-10 DIAGNOSIS — I10 Essential (primary) hypertension: Secondary | ICD-10-CM | POA: Diagnosis not present

## 2024-05-10 DIAGNOSIS — N3281 Overactive bladder: Secondary | ICD-10-CM

## 2024-05-10 DIAGNOSIS — N289 Disorder of kidney and ureter, unspecified: Secondary | ICD-10-CM

## 2024-05-10 DIAGNOSIS — R0609 Other forms of dyspnea: Secondary | ICD-10-CM | POA: Diagnosis not present

## 2024-05-10 DIAGNOSIS — E7800 Pure hypercholesterolemia, unspecified: Secondary | ICD-10-CM | POA: Diagnosis not present

## 2024-05-10 DIAGNOSIS — E038 Other specified hypothyroidism: Secondary | ICD-10-CM | POA: Diagnosis not present

## 2024-05-10 DIAGNOSIS — D582 Other hemoglobinopathies: Secondary | ICD-10-CM

## 2024-05-10 LAB — COMPREHENSIVE METABOLIC PANEL WITH GFR
ALT: 13 U/L (ref 3–35)
AST: 20 U/L (ref 5–37)
Albumin: 4.6 g/dL (ref 3.5–5.2)
Alkaline Phosphatase: 32 U/L — ABNORMAL LOW (ref 39–117)
BUN: 74 mg/dL — ABNORMAL HIGH (ref 6–23)
CO2: 19 meq/L (ref 19–32)
Calcium: 9.5 mg/dL (ref 8.4–10.5)
Chloride: 108 meq/L (ref 96–112)
Creatinine, Ser: 1.34 mg/dL — ABNORMAL HIGH (ref 0.40–1.20)
GFR: 36.87 mL/min — ABNORMAL LOW
Glucose, Bld: 104 mg/dL — ABNORMAL HIGH (ref 70–99)
Potassium: 5.2 meq/L — ABNORMAL HIGH (ref 3.5–5.1)
Sodium: 136 meq/L (ref 135–145)
Total Bilirubin: 0.5 mg/dL (ref 0.2–1.2)
Total Protein: 6.7 g/dL (ref 6.0–8.3)

## 2024-05-10 LAB — LIPID PANEL
Cholesterol: 115 mg/dL (ref 28–200)
HDL: 55.4 mg/dL
LDL Cholesterol: 31 mg/dL (ref 10–99)
NonHDL: 59.2
Total CHOL/HDL Ratio: 2
Triglycerides: 141 mg/dL (ref 10.0–149.0)
VLDL: 28.2 mg/dL (ref 0.0–40.0)

## 2024-05-10 LAB — CBC
HCT: 29 % — ABNORMAL LOW (ref 36.0–46.0)
Hemoglobin: 9.7 g/dL — ABNORMAL LOW (ref 12.0–15.0)
MCHC: 33.5 g/dL (ref 30.0–36.0)
MCV: 101.7 fl — ABNORMAL HIGH (ref 78.0–100.0)
Platelets: 244 K/uL (ref 150.0–400.0)
RBC: 2.85 Mil/uL — ABNORMAL LOW (ref 3.87–5.11)
RDW: 13.2 % (ref 11.5–15.5)
WBC: 7.5 K/uL (ref 4.0–10.5)

## 2024-05-10 LAB — TSH: TSH: 3.1 m[IU]/L (ref 0.40–4.50)

## 2024-05-10 LAB — T4, FREE: Free T4: 0.96 ng/dL (ref 0.60–1.60)

## 2024-05-10 MED ORDER — OXYBUTYNIN CHLORIDE ER 5 MG PO TB24: 5.0000 mg | ORAL_TABLET | Freq: Every day | ORAL | 3 refills | Status: AC

## 2024-05-10 NOTE — Telephone Encounter (Signed)
 Copied from CRM #[id number]. Topic: General - Call Back - No Documentation >> May 10, 2024  4:39 PM Shannon Bond wrote: Reason for CRM: Patient returning call to clinic, there isnt notes of call today.

## 2024-05-10 NOTE — Progress Notes (Signed)
 Chief Complaint  Patient presents with   Medication Refill    Medication Refill    Subjective Shannon Bond is a 84 y.o. female who presents for hypertension follow up. She does not monitor home blood pressures. She is compliant with medications-lisinopril  30 mg daily, hydrochlorothiazide  25 mg daily. Patient has these side effects of medication: none She is adhering to a healthy diet overall. Current exercise: Strength training, cardio No CP.Shannon Bond   Hyperlipidemia Patient presents for dyslipidemia follow up. Currently being treated with Crestor  20 mg daily and compliance with treatment thus far has been good. She denies myalgias. Diet/exercise as above. The patient is not known to have coexisting coronary artery disease.  Hypothyroidism Patient presents for follow-up of hypothyroidism.  Reports compliance with medication-levothyroxine  112 mcg daily. Current symptoms include: denies fatigue, weight changes, heat/cold intolerance, bowel/skin changes or CVS symptoms She believes her dose should be not significantly changed  DOE Over past 2 mo, has had fatigue a/w panting when she exerts herself. She feels it drains her afterwards. Eating healthy mostly. Exercises but this is limiting her. Denies CP or med changes. Happened after she saw cards last. No swelling, coughing, bleeding, N/V/D.  Overactive bladder History of overactive bladder and was placed on Myrbetriq .  Unfortunately it was too expensive.  She reports Vesicare and Ditropan  are covered by insurance.  No pain or bleeding.   Past Medical History:  Diagnosis Date   Alcohol abuse    Allergy    Alopecia    Aortic atherosclerosis 03/16/2021   Noted 12/2020 abdominal CT   Arthritis    BMI 27.0-27.9,adult 05/25/2022   Celiac disease    Chronic laryngitis 07/22/2019   Chronic rhinitis 07/22/2019   Diverticulitis    DOE (dyspnea on exertion) 07/02/2019   Dysphagia 07/22/2019   Edema of both lower extremities     Essential hypertension 01/24/2018   Generalized obesity 01/27/2022   Starting BMI 33.47     GERD (gastroesophageal reflux disease)    History of malignant neoplasm of skin 03/08/2021   Hyperthyroidism 03/08/2021   Hypothyroidism 01/24/2018   IBS (irritable bowel syndrome) 03/08/2021   Laryngopharyngeal reflux 07/22/2019   Mixed dyslipidemia 10/07/2021   Moderate aortic regurgitation 10/11/2019   Obesity (BMI 30-39.9) 04/01/2019   Onychomycosis 01/19/2024   Osteoarthritis    Osteoporosis 04/01/2019   Other hyperlipidemia 01/03/2022   Preop cardiovascular exam 03/09/2021   Seborrheic dermatitis of scalp 03/08/2021   Sensorineural hearing loss 07/22/2019   Squamous cell carcinoma in situ 03/08/2021   Subjective tinnitus 07/22/2019   Thyroid  disease    Thyroid  nodule 07/22/2019    Exam BP 134/68 (BP Location: Left Arm, Patient Position: Sitting)   Pulse 85   Temp 98 F (36.7 C) (Oral)   Resp 16   Ht 5' 2 (1.575 m)   Wt 159 lb (72.1 kg)   SpO2 98%   BMI 29.08 kg/m  General:  well developed, well nourished, in no apparent distress Heart: RRR, no bruits, no LE edema Lungs: clear to auscultation, no accessory muscle use MSK: No new ttp over LLE's Mouth: MMM Psych: well oriented with normal range of affect and appropriate judgment/insight  Essential hypertension  Pure hypercholesterolemia - Plan: Lipid panel, Comprehensive metabolic panel with GFR  Other specified hypothyroidism - Plan: T4, free, TSH, CANCELED: TSH  Exertional dyspnea - Plan: CBC, EKG 12-Lead  OAB (overactive bladder) - Plan: oxybutynin  (DITROPAN  XL) 5 MG 24 hr tablet  Chronic, stable.  Continue hydrochlorothiazide  25 mg daily, lisinopril  30  mg daily.  Counseled on diet and exercise. Chronic, stable.  Continue Crestor  20 mg daily. Chronic, stable.  Continue levothyroxine  112 mcg daily. EKG shows NSR, normal axis, no interval abnormalities, no ST segment or T wave changes, good R wave progression. Will  reach out to cards and ask her to as well. Maybe above lab results will explain this. Myrbetriq  was too expensive.  Will trial Ditropan  XL 5 mg daily.  Follow-up in 1 month to recheck this if not improving. F/u in 6 months. The patient voiced understanding and agreement to the plan.  Shannon [place], DO 05/10/24  12:22 PM

## 2024-05-10 NOTE — Patient Instructions (Addendum)
 Keep the diet clean and stay active.  Please reach out to Dr. Revankar.  Give us  2-3 business days to get the results of your labs back.   If symptoms worsen, please seek immediate care.  Let us  know if you need anything.

## 2024-05-16 ENCOUNTER — Other Ambulatory Visit

## 2024-05-16 DIAGNOSIS — D582 Other hemoglobinopathies: Secondary | ICD-10-CM

## 2024-05-16 DIAGNOSIS — N289 Disorder of kidney and ureter, unspecified: Secondary | ICD-10-CM | POA: Diagnosis not present

## 2024-05-16 LAB — BASIC METABOLIC PANEL WITH GFR
BUN: 55 mg/dL — ABNORMAL HIGH (ref 6–23)
CO2: 21 meq/L (ref 19–32)
Calcium: 10.1 mg/dL (ref 8.4–10.5)
Chloride: 103 meq/L (ref 96–112)
Creatinine, Ser: 1.27 mg/dL — ABNORMAL HIGH (ref 0.40–1.20)
GFR: 39.32 mL/min — ABNORMAL LOW
Glucose, Bld: 119 mg/dL — ABNORMAL HIGH (ref 70–99)
Potassium: 5 meq/L (ref 3.5–5.1)
Sodium: 134 meq/L — ABNORMAL LOW (ref 135–145)

## 2024-05-16 LAB — CBC WITH DIFFERENTIAL/PLATELET
Basophils Absolute: 0 10*3/uL (ref 0.0–0.1)
Basophils Relative: 0.2 % (ref 0.0–3.0)
Eosinophils Absolute: 0 10*3/uL (ref 0.0–0.7)
Eosinophils Relative: 0.1 % (ref 0.0–5.0)
HCT: 28.9 % — ABNORMAL LOW (ref 36.0–46.0)
Hemoglobin: 9.6 g/dL — ABNORMAL LOW (ref 12.0–15.0)
Lymphocytes Relative: 10 % — ABNORMAL LOW (ref 12.0–46.0)
Lymphs Abs: 1 10*3/uL (ref 0.7–4.0)
MCHC: 33.1 g/dL (ref 30.0–36.0)
MCV: 101.9 fl — ABNORMAL HIGH (ref 78.0–100.0)
Monocytes Absolute: 0.9 10*3/uL (ref 0.1–1.0)
Monocytes Relative: 9 % (ref 3.0–12.0)
Neutro Abs: 8 10*3/uL — ABNORMAL HIGH (ref 1.4–7.7)
Neutrophils Relative %: 80.7 % — ABNORMAL HIGH (ref 43.0–77.0)
Platelets: 270 10*3/uL (ref 150.0–400.0)
RBC: 2.84 Mil/uL — ABNORMAL LOW (ref 3.87–5.11)
RDW: 13.6 % (ref 11.5–15.5)
WBC: 10 10*3/uL (ref 4.0–10.5)

## 2024-05-16 NOTE — Addendum Note (Signed)
 Addended by: TRUDY CURVIN RAMAN on: 05/16/2024 11:07 AM   Modules accepted: Orders

## 2024-05-17 ENCOUNTER — Other Ambulatory Visit

## 2024-05-17 ENCOUNTER — Other Ambulatory Visit: Payer: Self-pay | Admitting: Family Medicine

## 2024-05-17 ENCOUNTER — Ambulatory Visit: Payer: Self-pay | Admitting: Family Medicine

## 2024-05-17 DIAGNOSIS — D539 Nutritional anemia, unspecified: Secondary | ICD-10-CM

## 2024-05-17 DIAGNOSIS — E538 Deficiency of other specified B group vitamins: Secondary | ICD-10-CM

## 2024-05-17 DIAGNOSIS — E0590 Thyrotoxicosis, unspecified without thyrotoxic crisis or storm: Secondary | ICD-10-CM

## 2024-05-17 LAB — IRON: Iron: 121 ug/dL (ref 42–145)

## 2024-05-17 LAB — B12 AND FOLATE PANEL
Folate: >23.4 ng/mL
Vitamin B-12: >1500 pg/mL — ABNORMAL HIGH (ref 211–911)

## 2024-05-20 ENCOUNTER — Ambulatory Visit: Admitting: Bariatrics

## 2024-05-21 ENCOUNTER — Encounter: Payer: Self-pay | Admitting: Bariatrics

## 2024-05-21 ENCOUNTER — Ambulatory Visit: Admitting: Bariatrics

## 2024-05-21 VITALS — BP 136/60 | HR 85 | Ht 62.0 in | Wt 153.0 lb

## 2024-05-21 DIAGNOSIS — E669 Obesity, unspecified: Secondary | ICD-10-CM

## 2024-05-21 DIAGNOSIS — Z6827 Body mass index (BMI) 27.0-27.9, adult: Secondary | ICD-10-CM | POA: Diagnosis not present

## 2024-05-21 DIAGNOSIS — D539 Nutritional anemia, unspecified: Secondary | ICD-10-CM | POA: Diagnosis not present

## 2024-05-21 DIAGNOSIS — I10 Essential (primary) hypertension: Secondary | ICD-10-CM

## 2024-05-21 NOTE — Progress Notes (Signed)
 "                                                                                                             WEIGHT SUMMARY AND BIOMETRICS  Weight Lost Since Last Visit: 1lb  Weight Gained Since Last Visit: 0   Vitals BP: 136/60 Pulse Rate: 85 SpO2: 99 %   Anthropometric Measurements Height: 5' 2 (1.575 m) Weight: 153 lb (69.4 kg) BMI (Calculated): 27.98 Weight at Last Visit: 154lb Weight Lost Since Last Visit: 1lb Weight Gained Since Last Visit: 0 Starting Weight: 183lb Total Weight Loss (lbs): 30 lb (13.6 kg)   Body Composition  Body Fat %: 44.8 % Fat Mass (lbs): 68.8 lbs Muscle Mass (lbs): 80.2 lbs Total Body Water (lbs): 66 lbs Visceral Fat Rating : 13   Other Clinical Data Fasting: no Labs: no Today's Visit #: 26 Starting Date: 06/24/19    OBESITY Shannon Bond is here to discuss her progress with her obesity treatment plan along with follow-up of her obesity related diagnoses.    Nutrition Plan: low carbohydrate plan and Other Renal diet - 100% adherence.  Current exercise: none  Interim History:  She is down 1 lb. according to the bioimpedance scale, she has been able to maintain her muscle and her body fat percentage did go down slightly.  She has been extremely tired secondary to a low hemoglobin hematocrit and her primary care physician recently referred her to hematology. Not eating all of the food on the plan., Protein intake is as prescribed, Is not skipping meals, and Water intake is adequate.   Pharmacotherapy: Shannon Bond is not on any antiobesity medications Hunger is moderately controlled.  Cravings are moderately controlled.  Assessment/Plan:   Hypertension Hypertension stable.  Medication(s): Hydrochlorothiazide  25 mg daily , Lisinopril  30 mg.   BP Readings from Last 3 Encounters:  05/21/24 136/60  05/10/24 134/68  04/22/24 138/62   Lab Results  Component Value Date   CREATININE 1.27 (H) 05/16/2024   CREATININE 1.34 (H) 05/10/2024    CREATININE 1.05 (H) 01/19/2024   Lab Results  Component Value Date   GFR 39.32 (L) 05/16/2024   GFR 36.87 (L) 05/10/2024   GFR 54.47 (L) 12/28/2023    Plan: Continue all antihypertensives at current dosages. No added salt. Will keep sodium content to 1,500 mg or less per day.   Will monitor her blood pressure at home.   Macrocytic anemia:  She has a history of microcytic anemia and her last H&H was even lower.  She has taken iron in the past without any real resolution.  Plan:  Her provider has referred her to hematology.  Labs reviewed today (BMP, Lipids, iron and folate, CBC,  glucose, insulin ,  B 12, and thyroid  panel).    Generalized Obesity: Current BMI BMI (Calculated): 27.98   Pharmacotherapy Plan No antiobesity medications  Shannon Bond is currently in the action stage of change. As such, her goal is to continue with weight loss efforts.  She has agreed to Other Low carbohydrate, renal diet at about 1,200 calories. Shannon Bond  Exercise goals: Older adults should determine their level of effort for physical activity relative to their level of fitness.   Behavioral modification strategies: increasing lean protein intake, no meal skipping, meal planning , better snacking choices, planning for success, increasing vegetables, weigh protein portions, measure portion sizes, and work on smaller portions.  Shannon Bond has agreed to follow-up with our clinic in 4 weeks.   Objective:   VITALS: Per patient if applicable, see vitals. GENERAL: Alert and in no acute distress. CARDIOPULMONARY: No increased WOB. Speaking in clear sentences.  PSYCH: Pleasant and cooperative. Speech normal rate and rhythm. Affect is appropriate. Insight and judgement are appropriate. Attention is focused, linear, and appropriate.  NEURO: Oriented as arrived to appointment on time with no prompting.   Attestation Statements:   This was prepared with the assistance of Engineer, Civil (consulting).  Occasional wrong-word  or sound-a-like substitutions may have occurred due to the inherent limitations of voice recognition .   Shannon Daring, DO   "

## 2024-05-29 ENCOUNTER — Inpatient Hospital Stay

## 2024-05-29 ENCOUNTER — Inpatient Hospital Stay: Admitting: Family

## 2024-06-18 ENCOUNTER — Ambulatory Visit: Admitting: Bariatrics

## 2024-11-12 ENCOUNTER — Encounter: Admitting: Family Medicine
